# Patient Record
Sex: Male | Born: 1960 | Race: White | Hispanic: No | Marital: Married | Smoking: Never smoker
Health system: Southern US, Community
[De-identification: ages and names within clinical notes are randomized; demographics above are authoritative.]

## PROBLEM LIST (undated history)

## (undated) DIAGNOSIS — K5731 Diverticulosis of large intestine without perforation or abscess with bleeding: Secondary | ICD-10-CM

## (undated) DIAGNOSIS — S52502A Unspecified fracture of the lower end of left radius, initial encounter for closed fracture: Secondary | ICD-10-CM

## (undated) DIAGNOSIS — J189 Pneumonia, unspecified organism: Secondary | ICD-10-CM

## (undated) DIAGNOSIS — E669 Obesity, unspecified: Secondary | ICD-10-CM

## (undated) DIAGNOSIS — K260 Acute duodenal ulcer with hemorrhage: Secondary | ICD-10-CM

## (undated) DIAGNOSIS — M1990 Unspecified osteoarthritis, unspecified site: Secondary | ICD-10-CM

## (undated) DIAGNOSIS — J45909 Unspecified asthma, uncomplicated: Secondary | ICD-10-CM

## (undated) DIAGNOSIS — K922 Gastrointestinal hemorrhage, unspecified: Secondary | ICD-10-CM

## (undated) DIAGNOSIS — G4730 Sleep apnea, unspecified: Secondary | ICD-10-CM

## (undated) DIAGNOSIS — R7303 Prediabetes: Secondary | ICD-10-CM

## (undated) DIAGNOSIS — T7840XA Allergy, unspecified, initial encounter: Secondary | ICD-10-CM

## (undated) DIAGNOSIS — K222 Esophageal obstruction: Secondary | ICD-10-CM

## (undated) DIAGNOSIS — B159 Hepatitis A without hepatic coma: Secondary | ICD-10-CM

## (undated) DIAGNOSIS — N200 Calculus of kidney: Secondary | ICD-10-CM

## (undated) DIAGNOSIS — Z87442 Personal history of urinary calculi: Secondary | ICD-10-CM

## (undated) DIAGNOSIS — D751 Secondary polycythemia: Secondary | ICD-10-CM

## (undated) DIAGNOSIS — D369 Benign neoplasm, unspecified site: Secondary | ICD-10-CM

## (undated) DIAGNOSIS — I509 Heart failure, unspecified: Secondary | ICD-10-CM

## (undated) DIAGNOSIS — I219 Acute myocardial infarction, unspecified: Secondary | ICD-10-CM

## (undated) DIAGNOSIS — E785 Hyperlipidemia, unspecified: Secondary | ICD-10-CM

## (undated) DIAGNOSIS — I10 Essential (primary) hypertension: Secondary | ICD-10-CM

## (undated) DIAGNOSIS — K5792 Diverticulitis of intestine, part unspecified, without perforation or abscess without bleeding: Secondary | ICD-10-CM

## (undated) HISTORY — DX: Hyperlipidemia, unspecified: E78.5

## (undated) HISTORY — PX: UVULECTOMY: SHX2631

## (undated) HISTORY — PX: APPENDECTOMY: SHX54

## (undated) HISTORY — DX: Hepatitis a without hepatic coma: B15.9

## (undated) HISTORY — DX: Acute duodenal ulcer with hemorrhage: K26.0

## (undated) HISTORY — DX: Calculus of kidney: N20.0

## (undated) HISTORY — DX: Diverticulosis of large intestine without perforation or abscess with bleeding: K57.31

## (undated) HISTORY — DX: Obesity, unspecified: E66.9

## (undated) HISTORY — DX: Essential (primary) hypertension: I10

## (undated) HISTORY — PX: FRACTURE SURGERY: SHX138

## (undated) HISTORY — PX: TONSILLECTOMY: SUR1361

## (undated) HISTORY — DX: Allergy, unspecified, initial encounter: T78.40XA

## (undated) HISTORY — DX: Esophageal obstruction: K22.2

## (undated) HISTORY — DX: Sleep apnea, unspecified: G47.30

## (undated) HISTORY — PX: CARPAL TUNNEL RELEASE: SHX101

## (undated) HISTORY — DX: Benign neoplasm, unspecified site: D36.9

## (undated) HISTORY — PX: CORONARY ARTERY BYPASS GRAFT: SHX141

## (undated) HISTORY — PX: HERNIA REPAIR: SHX51

---
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
## 1998-02-25 ENCOUNTER — Inpatient Hospital Stay (HOSPITAL_COMMUNITY): Admission: EM | Admit: 1998-02-25 | Discharge: 1998-02-28 | Payer: Self-pay | Admitting: Emergency Medicine

## 1998-02-25 ENCOUNTER — Encounter: Payer: Self-pay | Admitting: Emergency Medicine

## 1998-05-28 ENCOUNTER — Emergency Department (HOSPITAL_COMMUNITY): Admission: EM | Admit: 1998-05-28 | Discharge: 1998-05-28 | Payer: Self-pay | Admitting: Emergency Medicine

## 1998-06-30 ENCOUNTER — Ambulatory Visit (HOSPITAL_BASED_OUTPATIENT_CLINIC_OR_DEPARTMENT_OTHER): Admission: RE | Admit: 1998-06-30 | Discharge: 1998-06-30 | Payer: Self-pay | Admitting: *Deleted

## 2000-01-30 ENCOUNTER — Emergency Department (HOSPITAL_COMMUNITY): Admission: EM | Admit: 2000-01-30 | Discharge: 2000-01-30 | Payer: Self-pay | Admitting: Emergency Medicine

## 2000-01-30 ENCOUNTER — Encounter: Payer: Self-pay | Admitting: Emergency Medicine

## 2001-04-01 DIAGNOSIS — G4730 Sleep apnea, unspecified: Secondary | ICD-10-CM

## 2001-04-01 HISTORY — DX: Sleep apnea, unspecified: G47.30

## 2001-12-31 ENCOUNTER — Ambulatory Visit (HOSPITAL_BASED_OUTPATIENT_CLINIC_OR_DEPARTMENT_OTHER): Admission: RE | Admit: 2001-12-31 | Discharge: 2001-12-31 | Payer: Self-pay | Admitting: Oral Surgery

## 2002-03-02 ENCOUNTER — Encounter: Payer: Self-pay | Admitting: *Deleted

## 2002-03-04 ENCOUNTER — Ambulatory Visit (HOSPITAL_COMMUNITY): Admission: RE | Admit: 2002-03-04 | Discharge: 2002-03-05 | Payer: Self-pay | Admitting: *Deleted

## 2002-03-04 ENCOUNTER — Encounter (INDEPENDENT_AMBULATORY_CARE_PROVIDER_SITE_OTHER): Payer: Self-pay | Admitting: Specialist

## 2002-03-25 ENCOUNTER — Emergency Department (HOSPITAL_COMMUNITY): Admission: EM | Admit: 2002-03-25 | Discharge: 2002-03-25 | Payer: Self-pay | Admitting: Emergency Medicine

## 2003-04-06 ENCOUNTER — Emergency Department (HOSPITAL_COMMUNITY): Admission: EM | Admit: 2003-04-06 | Discharge: 2003-04-06 | Payer: Self-pay | Admitting: Emergency Medicine

## 2003-08-04 ENCOUNTER — Ambulatory Visit (HOSPITAL_COMMUNITY): Admission: RE | Admit: 2003-08-04 | Discharge: 2003-08-04 | Payer: Self-pay | Admitting: Gastroenterology

## 2003-08-04 ENCOUNTER — Encounter (INDEPENDENT_AMBULATORY_CARE_PROVIDER_SITE_OTHER): Payer: Self-pay | Admitting: *Deleted

## 2008-02-12 ENCOUNTER — Encounter: Payer: Self-pay | Admitting: Family Medicine

## 2008-05-24 ENCOUNTER — Ambulatory Visit: Payer: Self-pay | Admitting: Family Medicine

## 2008-05-24 DIAGNOSIS — Z87442 Personal history of urinary calculi: Secondary | ICD-10-CM | POA: Insufficient documentation

## 2008-05-24 DIAGNOSIS — E1169 Type 2 diabetes mellitus with other specified complication: Secondary | ICD-10-CM | POA: Insufficient documentation

## 2008-05-24 DIAGNOSIS — E785 Hyperlipidemia, unspecified: Secondary | ICD-10-CM

## 2008-05-24 DIAGNOSIS — J45909 Unspecified asthma, uncomplicated: Secondary | ICD-10-CM | POA: Insufficient documentation

## 2008-05-24 DIAGNOSIS — I10 Essential (primary) hypertension: Secondary | ICD-10-CM | POA: Insufficient documentation

## 2008-05-24 DIAGNOSIS — G4730 Sleep apnea, unspecified: Secondary | ICD-10-CM | POA: Insufficient documentation

## 2008-09-19 ENCOUNTER — Ambulatory Visit: Payer: Self-pay | Admitting: Family Medicine

## 2008-10-05 ENCOUNTER — Telehealth: Payer: Self-pay | Admitting: Family Medicine

## 2008-11-10 ENCOUNTER — Telehealth: Payer: Self-pay | Admitting: Family Medicine

## 2008-11-22 ENCOUNTER — Encounter: Payer: Self-pay | Admitting: Family Medicine

## 2009-01-02 ENCOUNTER — Ambulatory Visit: Payer: Self-pay | Admitting: Family Medicine

## 2009-01-02 DIAGNOSIS — J069 Acute upper respiratory infection, unspecified: Secondary | ICD-10-CM

## 2009-03-15 ENCOUNTER — Encounter: Payer: Self-pay | Admitting: Family Medicine

## 2009-04-04 ENCOUNTER — Encounter: Payer: Self-pay | Admitting: Family Medicine

## 2009-07-09 HISTORY — PX: COLONOSCOPY: SHX174

## 2009-08-18 ENCOUNTER — Telehealth: Payer: Self-pay | Admitting: Family Medicine

## 2009-09-15 ENCOUNTER — Telehealth: Payer: Self-pay | Admitting: Family Medicine

## 2009-09-18 ENCOUNTER — Ambulatory Visit: Payer: Self-pay | Admitting: Family Medicine

## 2009-11-16 ENCOUNTER — Ambulatory Visit: Payer: Self-pay | Admitting: Family Medicine

## 2009-11-16 DIAGNOSIS — M545 Low back pain, unspecified: Secondary | ICD-10-CM | POA: Insufficient documentation

## 2010-04-23 ENCOUNTER — Encounter: Payer: Self-pay | Admitting: Sports Medicine

## 2010-05-01 NOTE — Procedures (Signed)
Summary: Colonoscopy Report with Pathology/[facility]  Colonoscopy Report with Pathology/[facility]   Imported By: Maryln Gottron 09/28/2009 15:01:03  _____________________________________________________________________  External Attachment:    Type:   Image     Comment:   External Document

## 2010-05-01 NOTE — Consult Note (Signed)
Summary: [facility]  [facility]   Imported By: Maryln Gottron 04/20/2009 13:43:48  _____________________________________________________________________  External Attachment:    Type:   Image     Comment:   External Document

## 2010-05-01 NOTE — Progress Notes (Signed)
Summary: Provigil?  Phone Note Call from Patient   Caller: Patient Call For: Nelwyn Salisbury MD Summary of Call: Pt is traveling to [place], and is requesting a prescription for Provigil to help with jet lag.  Only wants #4.  [facility] [phone] Initial call taken by: Lynann Beaver CMA,  Aug 18, 2009 8:25 AM  Follow-up for Phone Call        call in Provigil 200 mg once daily as needed , #4 with no rf Follow-up by: Nelwyn Salisbury MD,  Aug 18, 2009 8:45 AM    New/Updated Medications: PROVIGIL 200 MG TABS (MODAFINIL) on by mouth daily prn Prescriptions: PROVIGIL 200 MG TABS (MODAFINIL) on by mouth daily prn  #4 x 0   Entered by:   Lynann Beaver CMA   Authorized by:   Nelwyn Salisbury MD   Signed by:   Lynann Beaver CMA on 08/18/2009   Method used:   Telephoned to ...       [facility]  [place] [address] (retail)       [address]       [place], [place]  [address]       Ph: [phone] or [phone]       Fax: [phone]   RxID:   [id number]  pt notified.

## 2010-05-01 NOTE — Progress Notes (Signed)
Summary: please return his call has ? about ekg from ins  Phone Note Call from Patient Call back at [phone]   Caller: Patient--live call Reason for Call: Talk to Nurse Summary of Call: wants to discuss with Dr Clent Ridges about an EKG that he received from ins company. Please return his call. Initial call taken by: Warnell Forester,  September 15, 2009 8:34 AM  Follow-up for Phone Call        Appt scheduled with Dr. Clent Ridges for repeat EKG. Follow-up by: Lynann Beaver CMA,  September 15, 2009 9:32 AM

## 2010-05-01 NOTE — Assessment & Plan Note (Signed)
Summary: EKG repeat for life insurance/dm   Vital Signs:  Patient profile:   50 year old male Weight:      293 pounds BMI:     39.88 BP sitting:   154 / 104  (left arm) Cuff size:   large  Vitals Entered By: Raechel Ache, RN (September 18, 2009 9:43 AM) CC: Requesting repeat EKG for life insurance policy.   History of Present Illness: Here for a repeat EKG. His BP has been quite steady at home, usually in the range of 110-130/70-80. He has felt fine. he recently requested an increase in his life insurance, and a company representative came to his home. He did an EKG, and Oscar [name] was told it was "abnormal" with no further details. They told him to check with me. Oscar [name] actually had a copy of this in his cell phone, which he showed me today. It looked quite normal.   Allergies: No Known Drug Allergies  Past History:  Past Medical History: Reviewed history from 05/24/2008 and no changes required. Chickenpox Hyperlipidemia Hypertension Nephrolithiasis, hx of sleep apnea, diagnosed with a sleep test 2003, uses a CPAP machine  obesity Asthma had Hepatitis A as a child  Past Surgical History: Appendectomy Tonsillectomy UPPP for sleep apnea repairs times four for ventral hernias at his appendectomy site colonoscopy 2011 per Dr. Wandalee Ferdinand, benign polyp, repeat 5 yrs  Review of Systems  The patient denies anorexia, fever, weight loss, weight gain, vision loss, decreased hearing, hoarseness, chest pain, syncope, dyspnea on exertion, peripheral edema, prolonged cough, headaches, hemoptysis, abdominal pain, melena, hematochezia, severe indigestion/heartburn, hematuria, incontinence, genital sores, muscle weakness, suspicious skin lesions, transient blindness, difficulty walking, depression, unusual weight change, abnormal bleeding, enlarged lymph nodes, angioedema, breast masses, and testicular masses.    Physical Exam  General:  overweight-appearing.   Lungs:  Normal respiratory effort,  chest expands symmetrically. Lungs are clear to auscultation, no crackles or wheezes. Heart:  Normal rate and regular rhythm. S1 and S2 normal without gallop, murmur, click, rub or other extra sounds. EKG normal.   Impression & Recommendations:  Problem # 1:  HYPERTENSION (ICD-401.9)  His updated medication list for this problem includes:    Hydrochlorothiazide 12.5 Mg Caps (Hydrochlorothiazide) ..... Once daily    Norvasc 10 Mg Tabs (Amlodipine besylate) ..... Once daily  Orders: EKG w/ Interpretation (93000)  Complete Medication List: 1)  Aspirin 81 Mg Tbec (Aspirin) .... One by mouth every day 2)  Co-enzyme Q-10 10 Mg Caps (Coenzyme q10) .... Once daily 3)  Fish Oil 1000 Mg Caps (Omega-3 fatty acids) .... Once daily 4)  Biotin 1000 Mcg Tabs (Biotin) .... Once daily 5)  Flax Oil (Flaxseed (linseed)) .... Once daily 6)  Hydrochlorothiazide 12.5 Mg Caps (Hydrochlorothiazide) .... Once daily 7)  Norvasc 10 Mg Tabs (Amlodipine besylate) .... Once daily 8)  Provigil 200 Mg Tabs (Modafinil) .... On by mouth daily prn 9)  Fexofenadine Hcl 180 Mg Tabs (Fexofenadine hcl) .Marland Kitchen.. 1 two times a day 10)  Nasonex 50 Mcg/act Susp (Mometasone furoate) .... As needed  Patient Instructions: 1)  His EKG looks fine today, and I wrote a note to that effect for him to give his insurance company.  2)  Please schedule a follow-up appointment as needed .

## 2010-05-01 NOTE — Letter (Signed)
Summary: EKG Results  EKG Results   Imported By: Maryln Gottron 09/19/2009 13:59:26  _____________________________________________________________________  External Attachment:    Type:   Image     Comment:   External Document
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
## 2010-05-01 NOTE — Letter (Signed)
Summary: [facility]  [facility]   Imported By: Maryln Gottron 05/25/2009 09:57:38  _____________________________________________________________________  External Attachment:    Type:   Image     Comment:   External Document

## 2010-08-17 NOTE — Op Note (Signed)
NAME:  Oscar [name], Oscar [name]                        ACCOUNT NO.:  [id number]   MEDICAL RECORD NO.:  [id number]                   PATIENT TYPE:  OIB   LOCATION:  2550                                 FACILITY:  [facility]   PHYSICIAN:  Veverly Fells. Arletha Grippe, M.D.             DATE OF BIRTH:  [birth year]   DATE OF PROCEDURE:  03/04/2002  DATE OF DISCHARGE:                                 OPERATIVE REPORT   PREOPERATIVE DIAGNOSES:  1. Severe obstructive sleep apnea.  2. Nasal airway obstruction.  3. Septal deviation.  4. Inferior turbinate hypertrophy.  5. Elongated soft palate and uvula.  6. Tongue base hypertrophy.   POSTOPERATIVE DIAGNOSES:  1. Severe obstructive sleep apnea.  2. Nasal airway obstruction.  3. Septal deviation.  4. Inferior turbinate hypertrophy.  5. Elongated soft palate and uvula.  6. Tongue base hypertrophy.   PROCEDURES:  1. Uvulopalatopharyngoplasty.  2. Tongue base somnoplasty.  3. Nasal septal reconstruction.  4. Intramural cauterization of both inferior turbinates.   SURGEON:  Veverly Fells. Arletha Grippe, M.D.   ANESTHESIA:  General endotracheal.   FLUIDS REPLACED:  Approximately 1 L crystalloid.   ESTIMATED BLOOD LOSS:  Less than 50 cc.   URINE OUTPUT:  Not measured.   DRAINS/PACKS:  None.   SPECIMENS:  Septal cartilage and bone and portion of uvula and soft palate,  for gross pathology only.   INDICATION FOR PROCEDURE:  This is a 50 year old white male with history of  hypertension, history of loud snoring at night with some daytime somnolence  and morning fatigue.  He has had prior palatal somnoplasty for elongated  soft palate and uvula.  Inpatient polysomnogram obtained at [facility] on 12/31/01 did show severe obstructive sleep apnea with an RDI of  60 events per hour and desaturations down to 78%.  Physical examination did  show a significant S-shaped septal deformity, inferior turbinate congestion  and hypertrophy, elongated soft palate and  uvula, and a significant amount  of tongue base hypertrophy.  Based on his history and physical examination,  I have recommended proceeding with the above-noted surgical procedure.  I  discussed extensively with him the risks and benefits of surgery including  risks of general anesthesia, infection, bleeding, possibility of airway  compromise with tongue base swelling after tongue base somnoplasty requiring  emergent intervention via surgery with either oral endotracheal intubation  or tracheotomy, and the need for septal splinting and long recovery period  expected after this type of surgery.  I have entertained any questions,  answered them appropriately, and informed consent has been obtained and the  patient presents now for the above-noted procedure.   OPERATIVE FINDINGS:  Septal deviation to the left with a septal spur.  Inferior turbinate congestion and hypertrophy.  Elongated soft palate and  uvula and moderate tongue base hypertrophy.   DESCRIPTION OF PROCEDURE:  The patient was brought in the operating room and  placed in  the supine position.  General endotracheal anesthesia administered  via the anesthesiologist without complication.  The patient was administered  1 g of Ancef IV x1 and 10 mg of Decadron IV x1.  The head of the table was  turned 90 degrees.  The patient's face was draped in the standard fashion.  A Crowe-Davis mouth retractor was inserted into the oral cavity.  This was  used to retract the mouth open.  The area of some scar tissue along both  tonsillar fossae was identified, and this area was resected using the  Harmonic scalpel in such a way as cut down on redundant posterior pharyngeal  mucosa.  The uvular portion of the soft palate was then transected in a  horizontal fashion using the Harmonic scalpel in such a way to leave enough  soft palate behind to prevent velopharyngeal insufficiency.  This was done  with a posterior-based trapdoor flap.  Bleeding  from the area was controlled  with a combination of Harmonic scalpel and suction cautery.  The tissue was  then resected using the Harmonic scalpel and sent to surgical pathology for  gross pathology.  The area was reinspected, and there was no evidence of any  active bleeding.  The anterior and posterior tonsillar pillars bilaterally  and the cut edges of the palate were reapproximated with multiple  interrupted 3-0 Vicryl suture.  Approximately 3 cc of 0.5% Marcaine solution  were infiltrated into the palate and anterior tonsillar pillars bilaterally.  The Crowe-Davis mouth retractor was released and brought through the oral  cavity without incident.  Next the head of the table was elevated 30  degrees.  A Maltz oral retractor was used to open the jaws widely.  A  retraction stitch was placed with a 2-0 silk stitch in the anterior tongue  to retract his tongue out anteriorly.  Methylene blue was then used to Terius  out three midline areas just at the level of the circumvallate papillae and  posterior to that.  Approximately 0.5 cm separated these marked-out three  areas.  Each area was injected with about 3-4 cc of a saline solution and  then three sequential tongue base somnoplasties were performed with the dual  probe.  One thousand two hundred joules were administered in each series of  lesions, that is, 600 joules were administered in each of the two probes, so  a total of six lesions were created in the midline from anterior to  posterior, separated about 0.5 cm, and were delivered with the dual probe  without difficulties.  After the tongue base somnoplasty was performed, the  retraction stitch was removed, the tongue was placed back in the oral  cavity, and the Maltz retractor was removed.  Cotton pledgets soaked in a 4%  cocaine solution were placed in each nasal cavity and were left in place for approximately five to 10 minutes and then removed.  Both sides of the septum  were  infiltrated with 1% lidocaine solution with 1:1000 epinephrine.  After  waiting approximately 10 minutes, a standard Killian incision was made on  the left side of the septum.  Mucoperichondrial and mucoperiosteal flap was  elevated on the left side using both blunt and sharp dissection.  An  intercartilaginous incision was made approximately 1 cm posterior from the  caudal end of the septum.  Mucoperichondrial and mucoperiosteal flap was  elevated on the right side using both blunt and sharp dissection.  Cartilaginous deviation was removed with the domes with  the  Silver knife.  Posterior bony deflection was removed with the Jansen-Middleton forceps, and  the septal spur which was pushing off the left nasal chamber was removed  using an open Jansen-Middleton forceps.  A piece of trimmed morcellized  cartilage was placed in between the septal flaps.  The septal incision was  closed with interrupted chromic suture, and the septum was reinforced with a  running transseptal plain gut mattress stitch.  Both inferior turbinates  were injected with a total of 6 cc of 1% lidocaine solution with 1:1000  epinephrine.  Both inferior turbinates were then intramurally cauterized  using the Elmed intramural cauterization unit set on a 12 watt setting.  Three passes of both inferior turbinates were performed without difficulty,  and both inferior turbinates were then outfractured using gentle lateral  pressure with a large nasal speculum.  Doyle splints soaked in a Bactroban  ointment solution were placed on either side of the septum and held in place  with transseptal Prolene suture.  An orogastric tube was placed.  This was  used to decompress the stomach contents.  It was then removed without  incident, and there was no evidence of any tongue base swelling after the  procedure.  The patient tolerated the procedure well without complications,  was extubated in the operating room and transferred to the  recovery room in  stable condition.  The sponge, instrument, and needle counts were correct at  the end of the procedure.  Total duration of procedure was approximately two  hours.  The patient will be admitted in a stepdown bed overnight for  monitoring purposes.  If he has recovered well, he will be sent home on  03/05/02.  He will be sent home on Augmentin elixir 400 mg p.o. t.i.d. for 10  days, Lortab elixir 250 cc with two refills 10-15 cc p.o. q.4h. p.r.n. pain,  and Vioxx 50 mg tablets one tablet p.o. q.d. for 10 days.  Both he and his  son were given oral and written instructions.  They are to call with any  problems with bleeding, fever, vomiting, pain, reaction to medications, or  any other questions.  He is to have light activity with no heavy lifting or  nose blowing for two weeks after surgery and a post-tonsillectomy diet for  two weeks after surgery.  He will follow up for splint removal in the office on 03/11/02 at 1:50 p.m.                                               Veverly Fells. Arletha Grippe, M.D.    MDR/MEDQ  D:  03/04/2002  T:  03/04/2002  Job:  [id number]   cc:   Dora Sims, M.D.  [address]  [place]  [place] [address]  Fax: [phone]

## 2010-08-17 NOTE — Op Note (Signed)
NAME:  Oscar, [name] NO.:  [id number]   MEDICAL RECORD NO.:  [id number]                   PATIENT TYPE:  AMB   LOCATION:  ENDO                                 FACILITY:  [facility]   PHYSICIAN:  Graylin Shiver, M.D.                DATE OF BIRTH:  Mar 06, 1961   DATE OF PROCEDURE:  08/04/2003  DATE OF DISCHARGE:                                 OPERATIVE REPORT   PROCEDURE:  Colonoscopy with polypectomy.   INDICATIONS FOR PROCEDURE:  Rectal bleeding.   CONSENT:  Informed consent was obtained after explanation of the risks of  bleeding, infection, and perforation.   PREMEDICATION:  Fentanyl 100 mcg IV, Versed 10 mg IV.   PROCEDURE IN DETAIL:  With the patient in the left lateral decubitus  position, a rectal exam was performed and no masses were felt.  The Olympus  colonoscope was inserted into the rectum and advanced around the colon to  the cecum.  Cecal landmarks were identified.  The cecum and ascending colon  were normal  The transverse colon was normal.  The descending colon and  sigmoid revealed diverticulosis.  In the sigmoid at 22 cm, there was an 8 mm  pedunculated polyp, this was snared and removed by snare cautery technique.  The polyp was retrieved and the cautery site looked good.  The rectum looked  normal.  There were some tiny internal hemorrhoids.  He tolerated the  procedure well without complications.   IMPRESSION:  1. Diverticulosis of the left colon.  2. Sigmoid polyp.  3. Tiny internal hemorrhoids.   PLAN:  The pathology will be checked on follow up.                                               Graylin Shiver, M.D.    Germain Osgood  D:  08/04/2003  T:  08/04/2003  Job:  [id number]   cc:   Stacie Acres. White, M.D.  [address]  [place]  [place] [address]  Fax: [phone]

## 2010-11-22 ENCOUNTER — Other Ambulatory Visit: Payer: Self-pay | Admitting: Family Medicine

## 2011-05-20 ENCOUNTER — Ambulatory Visit (INDEPENDENT_AMBULATORY_CARE_PROVIDER_SITE_OTHER): Payer: Self-pay | Admitting: Family Medicine

## 2011-05-20 ENCOUNTER — Encounter: Payer: Self-pay | Admitting: Family Medicine

## 2011-05-20 VITALS — BP 140/90 | HR 108 | Temp 98.3°F | Wt 271.0 lb

## 2011-05-20 DIAGNOSIS — J40 Bronchitis, not specified as acute or chronic: Secondary | ICD-10-CM

## 2011-05-20 MED ORDER — AMOXICILLIN-POT CLAVULANATE 875-125 MG PO TABS: 1.0000 | ORAL_TABLET | Freq: Two times a day (BID) | ORAL | Status: AC

## 2011-05-20 NOTE — Progress Notes (Signed)
  Subjective:    Patient ID: Oscar [name], male    DOB: July 10, 1960, 51 y.o.   MRN: [id number]  HPI Here for 5 days of chest congestion, SOB, and coughing up green sputum. On Mucinex.    Review of Systems  Constitutional: Negative.   HENT: Negative.   Eyes: Negative.   Respiratory: Positive for cough, shortness of breath and wheezing.        Objective:   Physical Exam  Constitutional: He appears well-developed and well-nourished.  HENT:  Right Ear: External ear normal.  Left Ear: External ear normal.  Nose: Nose normal.  Mouth/Throat: Oropharynx is clear and moist. No oropharyngeal exudate.  Eyes: Conjunctivae are normal.  Pulmonary/Chest: Effort normal. No respiratory distress. He has no wheezes. He has no rales.       Deep rhonchi   Lymphadenopathy:    He has no cervical adenopathy.          Assessment & Plan:  Recheck prn

## 2011-06-17 ENCOUNTER — Encounter: Payer: Self-pay | Admitting: Family Medicine

## 2011-06-17 ENCOUNTER — Ambulatory Visit (INDEPENDENT_AMBULATORY_CARE_PROVIDER_SITE_OTHER): Payer: BC Managed Care – PPO | Admitting: Family Medicine

## 2011-06-17 VITALS — BP 142/90 | HR 99 | Temp 98.1°F | Wt 272.0 lb

## 2011-06-17 DIAGNOSIS — R1032 Left lower quadrant pain: Secondary | ICD-10-CM

## 2011-06-17 NOTE — Progress Notes (Signed)
  Subjective:    Patient ID: Oscar [name], male    DOB: 1960-08-08, 51 y.o.   MRN: [id number]  HPI Here for 2 days of constant sharp pains in the LLQ of the abdomen. These are worse when he gets up from a sitting or lying position and walks around, then it feels better. He has no pain when sitting still, but he has mild pain when lying flat on his back. He has been working out hard at [employer] with abdominal and core exercises, and the night before this pain started he had the hardest workout he has ever had. He has been taking a lot of Pepto-Bismol this past week thinking he was "impacted" but he has had loose stools for the past 4 days. No fever or nausea. Appetite is normal. No urinary symptoms.   Review of Systems  Constitutional: Negative.   Respiratory: Negative.   Cardiovascular: Negative.   Gastrointestinal: Positive for abdominal pain and diarrhea. Negative for nausea, vomiting, constipation, blood in stool, abdominal distention and rectal pain.  Genitourinary: Negative.        Objective:   Physical Exam  Constitutional: He appears well-developed and well-nourished.  Abdominal: Soft. Bowel sounds are normal. He exhibits no distension. There is no rebound and no guarding.       He has a long term ventral hernia in the midline which is not tender at all. He is slightly tender in there LLQ just above the pelvic brim. No inguinal hernias.   Genitourinary: Rectum normal and prostate normal.          Assessment & Plan:  This is probably a muscular injury, most likely a tear in the lower abdominal wall from his workouts. The loose stools probably result from using too much Pepto-Bismol. We agreed to give this some more time, and he will call us for an update tomorrow. He will follow up if the pain gets any worse or he gets a fever, etc.

## 2011-06-18 ENCOUNTER — Encounter (HOSPITAL_COMMUNITY): Payer: Self-pay

## 2011-06-18 ENCOUNTER — Telehealth: Payer: Self-pay | Admitting: Family Medicine

## 2011-06-18 ENCOUNTER — Emergency Department (HOSPITAL_COMMUNITY)
Admission: EM | Admit: 2011-06-18 | Discharge: 2011-06-18 | Disposition: A | Discharge status: Home / Self Care | Payer: BC Managed Care – PPO | Attending: Emergency Medicine | Admitting: Emergency Medicine

## 2011-06-18 ENCOUNTER — Ambulatory Visit (INDEPENDENT_AMBULATORY_CARE_PROVIDER_SITE_OTHER)
Admission: RE | Admit: 2011-06-18 | Discharge: 2011-06-18 | Disposition: A | Discharge status: Home / Self Care | Payer: BC Managed Care – PPO | Source: Ambulatory Visit | Attending: Family Medicine | Admitting: Family Medicine

## 2011-06-18 DIAGNOSIS — Z0389 Encounter for observation for other suspected diseases and conditions ruled out: Secondary | ICD-10-CM | POA: Insufficient documentation

## 2011-06-18 DIAGNOSIS — R1032 Left lower quadrant pain: Secondary | ICD-10-CM

## 2011-06-18 LAB — BASIC METABOLIC PANEL
CO2: 24 mEq/L (ref 19–32)
Calcium: 8.7 mg/dL (ref 8.4–10.5)
GFR: 79.15 mL/min (ref >60.00)
Potassium: 3.6 mEq/L (ref 3.5–5.1)
Sodium: 141 mEq/L (ref 135–145)

## 2011-06-18 LAB — POCT URINALYSIS DIPSTICK
Bilirubin, UA: NEGATIVE
Glucose, UA: NEGATIVE
Spec Grav, UA: 1.025

## 2011-06-18 LAB — HEPATIC FUNCTION PANEL
Alkaline Phosphatase: 53 U/L (ref 39–117)
Bilirubin, Direct: 0 mg/dL (ref 0.0–0.3)
Total Protein: 7.6 g/dL (ref 6.0–8.3)

## 2011-06-18 LAB — CBC WITH DIFFERENTIAL/PLATELET
Basophils Relative: 0.3 % (ref 0.0–3.0)
Eosinophils Absolute: 0.1 10*3/uL (ref 0.0–0.7)
Hemoglobin: 15.7 g/dL (ref 13.0–17.0)
MCHC: 33.5 g/dL (ref 30.0–36.0)
MCV: 87.5 fl (ref 78.0–100.0)
Monocytes Absolute: 1 10*3/uL (ref 0.1–1.0)
Neutro Abs: 6.7 10*3/uL (ref 1.4–7.7)
RBC: 5.36 Mil/uL (ref 4.22–5.81)

## 2011-06-18 MED ORDER — IOHEXOL 300 MG/ML  SOLN: 100.0000 mL | Freq: Once | INTRAMUSCULAR | Status: DC | PRN

## 2011-06-18 NOTE — Telephone Encounter (Signed)
Patient called stating that he will be leaving the country on Thursday and would like to be called on his mobile phone with results on his CT scan. Please assist.

## 2011-06-18 NOTE — Telephone Encounter (Signed)
Dr. Clent Ridges did speak with pt.

## 2011-06-18 NOTE — ED Notes (Signed)
Name called no answer 

## 2011-06-18 NOTE — Progress Notes (Signed)
Addended by: Gershon Crane A on: 06/18/2011 08:24 AM   Modules accepted: Orders

## 2011-06-18 NOTE — ED Notes (Signed)
While triaging pt.,  Pt. Became upset and annoyed, that he was not going directly back for admission.   Spoke with pt. About the conversation from his Doctors office and encouraged pt. To call his PCP at [facility].  After pt. Spoke with his doctor's office, he is to be evaluated by the EDP.   Explained to pt. That we would get him to a room as soon as we could and to please let us know if any thing chan ges .  Presently, pt denies any pain or discomfort, vitals stable.

## 2011-06-18 NOTE — ED Notes (Signed)
Pt. Having abdominal pain in his rt. Lower abdominal area  And was diagnosed today by CT scan with diverticultis.  Pt. Was directed to come to [place] for Eval.

## 2011-06-18 NOTE — ED Notes (Signed)
[facility] called to report that pt. Would be coming to ed and to Have EDP eval for new diagnosis of diverticultis.

## 2011-06-20 NOTE — Progress Notes (Signed)
Quick Note:  Left voice message and requested that pt call back and give a update on his condition and how he is feeling. ______

## 2011-10-26 ENCOUNTER — Other Ambulatory Visit: Payer: Self-pay | Admitting: Family Medicine

## 2012-02-24 ENCOUNTER — Ambulatory Visit (INDEPENDENT_AMBULATORY_CARE_PROVIDER_SITE_OTHER): Payer: BC Managed Care – PPO | Admitting: Surgery

## 2012-02-24 ENCOUNTER — Encounter (INDEPENDENT_AMBULATORY_CARE_PROVIDER_SITE_OTHER): Payer: Self-pay | Admitting: Surgery

## 2012-02-24 VITALS — BP 148/90 | HR 90 | Temp 98.0°F | Resp 16 | Ht 74.5 in | Wt 281.6 lb

## 2012-02-24 DIAGNOSIS — M6208 Separation of muscle (nontraumatic), other site: Secondary | ICD-10-CM | POA: Insufficient documentation

## 2012-02-24 DIAGNOSIS — K432 Incisional hernia without obstruction or gangrene: Secondary | ICD-10-CM | POA: Insufficient documentation

## 2012-02-24 DIAGNOSIS — M6200 Separation of muscle (nontraumatic), unspecified site: Secondary | ICD-10-CM

## 2012-02-24 NOTE — Patient Instructions (Signed)
  [facility]  LAPAROSCOPIC SURGERY - POST-OP INSTRUCTIONS  Always review your discharge instruction sheet given to you by the facility where your surgery was performed.  A prescription for pain medication may be given to you upon discharge.  Take your pain medication as prescribed.  If narcotic pain medicine is not needed, then you may take acetaminophen (Tylenol) or ibuprofen (Advil) as needed.  Take your usually prescribed medications unless otherwise directed.  If you need a refill on your pain medication, please contact your pharmacy.  They will contact our office to request authorization. Prescriptions will not be filled after 5 P.M. or on weekends.  You should follow a light diet the first few days after arrival home, such as soup and crackers or toast.  Be sure to include plenty of fluids daily.  Most patients will experience some swelling and bruising in the area of the incisions.  Ice packs will help.  Swelling and bruising can take several days to resolve.   It is common to experience some constipation if taking pain medication after surgery.  Increasing fluid intake and taking a stool softener (such as Colace) will usually help or prevent this problem from occurring.  A mild laxative (Milk of Magnesia or Miralax) should be taken according to package instructions if there are no bowel movements after 48 hours.  Unless discharge instructions indicate otherwise, you may remove your bandages 24-48 hours after surgery, and you may shower at that time.  You may have steri-strips (small skin tapes) in place directly over the incision.  These strips should be left on the skin for 7-10 days.  If your surgeon used skin glue on the incision, you may shower in 24 hours.  The glue will flake off over the next 2-3 weeks.  Any sutures or staples will be removed at the office during your follow-up visit.  ACTIVITIES:  You may resume regular (light) daily activities beginning the  next day-such as daily self-care, walking, climbing stairs-gradually increasing activities as tolerated.  You may have sexual intercourse when it is comfortable.  Refrain from any heavy lifting or straining until approved by your doctor.  You may drive when you are no longer taking prescription pain medication, you can comfortably wear a seatbelt, and you can safely maneuver your car and apply brakes.  You should see your doctor in the office for a follow-up appointment approximately 2-3 weeks after your surgery.  Make sure that you call for this appointment within a day or two after you arrive home to insure a convenient appointment time.  WHEN TO CALL YOUR DOCTOR: 1. Fever over 101.0 2. Inability to urinate 3. Continued bleeding from incision 4. Increased pain, redness, or drainage from the incision 5. Increasing abdominal pain  The clinic staff is available to answer your questions during regular business hours.  Please don't hesitate to call and ask to speak to one of the nurses for clinical concerns.  If you have a medical emergency, go to the nearest emergency room or call 911.  A surgeon from [facility] is always on call for the hospital.  Stevon Gough M. Jun Osment, MD, FACS [facility] Office: [phone] Toll Free:  [phone] [phone]  Web site: [email or web address] 

## 2012-02-24 NOTE — Progress Notes (Signed)
General Surgery [facility]  Chief Complaint  Patient presents with  . Umbilical Hernia    Evaluate for possible recurrent hernia - referral from Dr. Malva Limes, primary care Dr. Gershon Crane    HISTORY: The patient is a 51 year old white male referred for evaluation of a recurrent ventral incisional hernia. Patient apparently had undergone laparoscopic appendectomy approximately 10 years ago. He subsequently developed a hernia at the level of the umbilicus. He has had for additional procedures to repair the hernia, all of which have failed. He is minimally symptomatic. He occasionally has to discomfort especially with pressure against the abdominal wall. He actively exercises and lifts weights and this results in some discomfort. A CT scan of the abdomen and pelvis performed March 2013 shows a recurrent hernia at the level of the umbilicus containing adipose tissue. There is no sign of intestinal obstruction. Patient has also noted a rectus diastasis. He presents today for evaluation and further surgical recommendations.  Other than appendectomy and hernia repairs at the level of the umbilicus, the patient has had no other abdominal surgical procedures.  Past Medical History  Diagnosis Date  . Hyperlipidemia   . Asthma   . Chickenpox   . Nephrolithiasis   . Sleep apnea 2003    CPAP machine  . Obesity   . Hepatitis A     as a child  . Hypertension     he also sees Dr. Shellee Milo in [place], [place]      Current Outpatient Prescriptions  Medication Sig Dispense Refill  . amLODipine (NORVASC) 10 MG tablet TAKE 1 TABLET BY MOUTH EVERY DAY  30 tablet  10  . Ascorbic Acid (VITAMIN C) 1000 MG tablet Take 1,000 mg by mouth daily.      Marland Kitchen aspirin EC 81 MG tablet Take 81 mg by mouth daily.      Marland Kitchen b complex vitamins tablet Take 1 tablet by mouth daily.      . Cholecalciferol (VITAMIN D3) 5000 UNITS TABS Take 1 tablet by mouth at bedtime.       . Coenzyme Q10 (CO Q 10 PO) Take  1 capsule by mouth daily.       Marland Kitchen KRILL OIL PO Take 1 capsule by mouth daily.       . metFORMIN (GLUCOPHAGE) 500 MG tablet Take 1,000 mg by mouth 2 (two) times daily with a meal.      . niacin 500 MG tablet Take 500 mg by mouth at bedtime.       . Probiotic Product (PROBIOTIC FORMULA PO) Take 1 tablet by mouth daily.       . psyllium (REGULOID) 0.52 G capsule Take 0.52 g by mouth daily.      . [DISCONTINUED] amLODipine (NORVASC) 10 MG tablet Take 10 mg by mouth daily.         No Known Allergies   Family History  Problem Relation Age of Onset  . Alcohol abuse    . Diabetes    . Hypertension    . Cancer      lung  . Stroke    . Obesity       History   Social History  . Marital Status: Married    Spouse Name: N/A    Number of Children: N/A  . Years of Education: N/A   Social History Main Topics  . Smoking status: Never Smoker   . Smokeless tobacco: Never Used  . Alcohol Use: Yes  Comment: couple times a month  . Drug Use: No  . Sexually Active: None   Other Topics Concern  . None   Social History Narrative  . None     REVIEW OF SYSTEMS - PERTINENT POSITIVES ONLY: Denies signs or symptoms of obstruction. Occasional discomfort at level of umbilicus.  EXAM: Filed Vitals:   02/24/12 1453  BP: 148/90  Pulse: 90  Temp: 98 F (36.7 C)  Resp: 16    HEENT: normocephalic; pupils equal and reactive; sclerae clear; dentition good; mucous membranes moist NECK:  symmetric on extension; no palpable anterior or posterior cervical lymphadenopathy; no supraclavicular masses; no tenderness CHEST: clear to auscultation bilaterally without rales, rhonchi, or wheezes CARDIAC: regular rate and rhythm without significant murmur; peripheral pulses are full ABDOMEN: soft without distension; bowel sounds present; no mass; no hepatosplenomegaly; moderate rectus diastasis with sit-up maneuver; moderate hernia at level of the umbilicus with fascial defect measuring approximately 3  cm in diameter, reducible, nontender EXT:  non-tender without edema; no deformity NEURO: no gross focal deficits; no sign of tremor   LABORATORY RESULTS: See [facility] (CHL-Epic) for most recent results   RADIOLOGY RESULTS: See [facility] (CHL-Epic) for most recent results   IMPRESSION: #1 recurrent ventral incisional hernia at level of umbilicus, reducible, mildly symptomatic #2 rectus diastasis, moderate #3 hypertension #4 hyperglycemia  PLAN: I discussed at length with the patient the options for surgical management. If the patient desires to restore the contour of his abdominal wall and repair the rectus diastases at the same time as repairing his umbilical/incisional hernia, then he needs to consider plastic surgery consultation for abdominoplasty. If the patient's goal is repair of his incisional hernia at the level of the umbilicus, then I believe a laparoscopic approach with a wide overlay of mesh is the appropriate course.  Patient and I discussed this at length. I recommended laparoscopic repair of his incisional hernia with mesh. We have discussed the hospital stay to be anticipated. We have discussed potential complications including infection, recurrence, and failure to achieve desired cosmetic results. He understands and wishes to proceed with surgery in the near future. We will make arrangements for his procedure at a time convenient for him. We have discussed the restrictions on his physical activities and on his exercise program following the procedure. He understands and agrees to comply.  The risks and benefits of the procedure have been discussed at length with the patient.  The patient understands the proposed procedure, potential alternative treatments, and the course of recovery to be expected.  All of the patient's questions have been answered at this time.  The patient wishes to proceed with surgery.  Velora Heckler, MD, FACS General & Endocrine  Surgery [facility]   Visit Diagnoses: 1. Incisional hernia, recurrent   2. Rectus diastasis     Primary Care Physician: Nelwyn Salisbury, MD

## 2012-03-27 ENCOUNTER — Encounter (HOSPITAL_COMMUNITY): Payer: Self-pay | Admitting: Pharmacy Technician

## 2012-04-03 NOTE — Progress Notes (Signed)
Office visit Dr. Gerrit Friends 02/24/12 on chart

## 2012-04-06 ENCOUNTER — Other Ambulatory Visit (HOSPITAL_COMMUNITY): Payer: Self-pay | Admitting: Surgery

## 2012-04-06 ENCOUNTER — Encounter (HOSPITAL_COMMUNITY): Payer: Self-pay

## 2012-04-06 ENCOUNTER — Encounter (HOSPITAL_COMMUNITY)
Admission: RE | Admit: 2012-04-06 | Discharge: 2012-04-06 | Disposition: A | Discharge status: Home / Self Care | Payer: BC Managed Care – PPO | Source: Ambulatory Visit | Attending: Surgery | Admitting: Surgery

## 2012-04-06 ENCOUNTER — Ambulatory Visit (HOSPITAL_COMMUNITY)
Admission: RE | Admit: 2012-04-06 | Discharge: 2012-04-06 | Disposition: A | Discharge status: Home / Self Care | Payer: BC Managed Care – PPO | Source: Ambulatory Visit | Attending: Surgery | Admitting: Surgery

## 2012-04-06 DIAGNOSIS — Z01812 Encounter for preprocedural laboratory examination: Secondary | ICD-10-CM | POA: Insufficient documentation

## 2012-04-06 DIAGNOSIS — Z01818 Encounter for other preprocedural examination: Secondary | ICD-10-CM | POA: Insufficient documentation

## 2012-04-06 DIAGNOSIS — I10 Essential (primary) hypertension: Secondary | ICD-10-CM | POA: Insufficient documentation

## 2012-04-06 LAB — SURGICAL PCR SCREEN
MRSA, PCR: NEGATIVE
Staphylococcus aureus: NEGATIVE

## 2012-04-06 LAB — CBC
Platelets: 271 10*3/uL (ref 150–400)
RBC: 5.83 MIL/uL — ABNORMAL HIGH (ref 4.22–5.81)
WBC: 8.1 10*3/uL (ref 4.0–10.5)

## 2012-04-06 LAB — BASIC METABOLIC PANEL
Calcium: 9.6 mg/dL (ref 8.4–10.5)
GFR calc Af Amer: >90 mL/min (ref >90)
GFR calc non Af Amer: >90 mL/min (ref >90)
Sodium: 139 mEq/L (ref 135–145)

## 2012-04-06 NOTE — Progress Notes (Signed)
Pt on metformin but not diabetic, in wellness study.

## 2012-04-06 NOTE — Patient Instructions (Addendum)
20 Oscar [name]  04/06/2012   Your procedure is scheduled on: 04-09-2012  Report to [facility] a 0830 AM.  Call this number if you have problems the morning of surgery [phone]   Remember:bring cpap mask and tubing   Do not eat food or drink liquids :After Midnight.    Take these medicines the morning of surgery with A SIP OF WATER: amlodipine, lipitor, zyrtec  Do not wear jewelry, make-up or nail polish.  Do not wear lotions, powders, or perfumes. You may wear deodorant.  Do not shave 48 hours prior to surgery. Men may shave face and neck.  Do not bring valuables to the hospital.  Contacts, dentures or bridgework may not be worn into surgery.  Leave suitcase in the car. After surgery it may be brought to your room.  For patients admitted to the hospital, checkout time is 11:00 AM the day of discharge.   Patients discharged the day of surgery will not be allowed to drive home.  Name and phone number of your driver:  Special Instructions: N/A   Please read over the following fact sheets that you were given: MRSA Information.  Call Cain Sieve RN pre op nurse if needed [phone]    FAILURE TO FOLLOW THESE INSTRUCTIONS MAY RESULT IN THE CANCELLATION OF YOUR SURGERY. PATIENT SIGNATURE___________________________________________

## 2012-04-09 ENCOUNTER — Encounter (HOSPITAL_COMMUNITY): Payer: Self-pay | Admitting: Anesthesiology

## 2012-04-09 ENCOUNTER — Observation Stay (HOSPITAL_COMMUNITY)
Admission: RE | Admit: 2012-04-09 | Discharge: 2012-04-10 | Disposition: A | Discharge status: Home / Self Care | Payer: BC Managed Care – PPO | Source: Ambulatory Visit | Attending: Surgery | Admitting: Surgery

## 2012-04-09 ENCOUNTER — Ambulatory Visit (HOSPITAL_COMMUNITY): Payer: BC Managed Care – PPO | Admitting: Anesthesiology

## 2012-04-09 ENCOUNTER — Encounter (HOSPITAL_COMMUNITY)
Admission: RE | Disposition: A | Discharge status: Home / Self Care | Payer: Self-pay | Source: Ambulatory Visit | Attending: Surgery

## 2012-04-09 ENCOUNTER — Encounter (HOSPITAL_COMMUNITY): Payer: Self-pay | Admitting: Surgery

## 2012-04-09 ENCOUNTER — Encounter (HOSPITAL_COMMUNITY): Payer: Self-pay | Admitting: *Deleted

## 2012-04-09 DIAGNOSIS — E785 Hyperlipidemia, unspecified: Secondary | ICD-10-CM | POA: Insufficient documentation

## 2012-04-09 DIAGNOSIS — M6200 Separation of muscle (nontraumatic), unspecified site: Secondary | ICD-10-CM | POA: Insufficient documentation

## 2012-04-09 DIAGNOSIS — K432 Incisional hernia without obstruction or gangrene: Principal | ICD-10-CM | POA: Insufficient documentation

## 2012-04-09 DIAGNOSIS — Z7982 Long term (current) use of aspirin: Secondary | ICD-10-CM | POA: Insufficient documentation

## 2012-04-09 DIAGNOSIS — E669 Obesity, unspecified: Secondary | ICD-10-CM | POA: Insufficient documentation

## 2012-04-09 DIAGNOSIS — R7309 Other abnormal glucose: Secondary | ICD-10-CM | POA: Insufficient documentation

## 2012-04-09 DIAGNOSIS — G4730 Sleep apnea, unspecified: Secondary | ICD-10-CM | POA: Insufficient documentation

## 2012-04-09 DIAGNOSIS — Z79899 Other long term (current) drug therapy: Secondary | ICD-10-CM | POA: Insufficient documentation

## 2012-04-09 DIAGNOSIS — K430 Incisional hernia with obstruction, without gangrene: Secondary | ICD-10-CM

## 2012-04-09 DIAGNOSIS — I10 Essential (primary) hypertension: Secondary | ICD-10-CM | POA: Insufficient documentation

## 2012-04-09 HISTORY — PX: VENTRAL HERNIA REPAIR: SHX424

## 2012-04-09 HISTORY — PX: INSERTION OF MESH: SHX5868

## 2012-04-09 LAB — CBC
HCT: 47.2 % (ref 39.0–52.0)
Hemoglobin: 16.2 g/dL (ref 13.0–17.0)
MCH: 29.1 pg (ref 26.0–34.0)
MCHC: 34.3 g/dL (ref 30.0–36.0)
MCV: 84.7 fL (ref 78.0–100.0)
Platelets: 242 10*3/uL (ref 150–400)
RBC: 5.57 MIL/uL (ref 4.22–5.81)
RDW: 14.3 % (ref 11.5–15.5)
WBC: 20 10*3/uL — ABNORMAL HIGH (ref 4.0–10.5)

## 2012-04-09 LAB — CREATININE, SERUM: GFR calc Af Amer: 87 mL/min — ABNORMAL LOW (ref >90)

## 2012-04-09 SURGERY — REPAIR, HERNIA, VENTRAL, LAPAROSCOPIC
Anesthesia: General | Site: Abdomen | Wound class: Clean

## 2012-04-09 MED ORDER — ACETAMINOPHEN 325 MG PO TABS: 650.0000 mg | ORAL_TABLET | ORAL | Status: DC | PRN

## 2012-04-09 MED ORDER — HYDROMORPHONE HCL PF 1 MG/ML IJ SOLN
0.2500 mg | INTRAMUSCULAR | Status: DC | PRN
  Administered 2012-04-09 (×2): 0.5 mg via INTRAVENOUS

## 2012-04-09 MED ORDER — HYDROCODONE-ACETAMINOPHEN 5-325 MG PO TABS
1.0000 | ORAL_TABLET | ORAL | Status: DC | PRN
  Administered 2012-04-10 (×3): 2 via ORAL
  Administered 2012-04-10: 1 via ORAL
  Filled 2012-04-09 (×4): qty 2

## 2012-04-09 MED ORDER — BUPIVACAINE-EPINEPHRINE 0.25% -1:200000 IJ SOLN
INTRAMUSCULAR | Status: DC | PRN
  Administered 2012-04-09: 21 mL

## 2012-04-09 MED ORDER — KCL IN DEXTROSE-NACL 30-5-0.45 MEQ/L-%-% IV SOLN
INTRAVENOUS | Status: DC
  Administered 2012-04-09 – 2012-04-10 (×2): via INTRAVENOUS
  Filled 2012-04-09 (×3): qty 1000

## 2012-04-09 MED ORDER — HYDROMORPHONE HCL PF 1 MG/ML IJ SOLN
1.0000 mg | INTRAMUSCULAR | Status: DC | PRN
  Administered 2012-04-09 – 2012-04-10 (×3): 1 mg via INTRAVENOUS
  Filled 2012-04-09 (×3): qty 1

## 2012-04-09 MED ORDER — PROPOFOL 10 MG/ML IV BOLUS
INTRAVENOUS | Status: DC | PRN
  Administered 2012-04-09: 300 mg via INTRAVENOUS

## 2012-04-09 MED ORDER — 0.9 % SODIUM CHLORIDE (POUR BTL) OPTIME
TOPICAL | Status: DC | PRN
  Administered 2012-04-09: 1000 mL

## 2012-04-09 MED ORDER — PNEUMOCOCCAL VAC POLYVALENT 25 MCG/0.5ML IJ INJ
0.5000 mL | INJECTION | INTRAMUSCULAR | Status: DC
  Filled 2012-04-09 (×2): qty 0.5

## 2012-04-09 MED ORDER — LACTATED RINGERS IV SOLN
INTRAVENOUS | Status: DC | PRN
  Administered 2012-04-09 (×2): via INTRAVENOUS

## 2012-04-09 MED ORDER — DEXTROSE 5 % IV SOLN
3.0000 g | INTRAVENOUS | Status: AC
  Administered 2012-04-09: 3 g via INTRAVENOUS
  Filled 2012-04-09: qty 3000

## 2012-04-09 MED ORDER — ACETAMINOPHEN 10 MG/ML IV SOLN
INTRAVENOUS | Status: DC | PRN
  Administered 2012-04-09: 1000 mg via INTRAVENOUS

## 2012-04-09 MED ORDER — MIDAZOLAM HCL 5 MG/5ML IJ SOLN
INTRAMUSCULAR | Status: DC | PRN
  Administered 2012-04-09: 2 mg via INTRAVENOUS

## 2012-04-09 MED ORDER — ONDANSETRON HCL 4 MG/2ML IJ SOLN
4.0000 mg | Freq: Four times a day (QID) | INTRAMUSCULAR | Status: DC | PRN
  Administered 2012-04-09: 4 mg via INTRAVENOUS
  Filled 2012-04-09: qty 2

## 2012-04-09 MED ORDER — AMLODIPINE BESYLATE 10 MG PO TABS
10.0000 mg | ORAL_TABLET | Freq: Every morning | ORAL | Status: DC
  Administered 2012-04-10: 10 mg via ORAL
  Filled 2012-04-09: qty 1

## 2012-04-09 MED ORDER — NEOSTIGMINE METHYLSULFATE 1 MG/ML IJ SOLN
INTRAMUSCULAR | Status: DC | PRN
  Administered 2012-04-09: 5 mg via INTRAVENOUS

## 2012-04-09 MED ORDER — BIOTENE DRY MOUTH MT LIQD
15.0000 mL | Freq: Two times a day (BID) | OROMUCOSAL | Status: DC
  Administered 2012-04-09 – 2012-04-10 (×2): 15 mL via OROMUCOSAL

## 2012-04-09 MED ORDER — KETOROLAC TROMETHAMINE 30 MG/ML IJ SOLN: 15.0000 mg | Freq: Once | INTRAMUSCULAR | Status: DC | PRN

## 2012-04-09 MED ORDER — ONDANSETRON HCL 4 MG/2ML IJ SOLN
INTRAMUSCULAR | Status: DC | PRN
  Administered 2012-04-09: 4 mg via INTRAVENOUS

## 2012-04-09 MED ORDER — LACTATED RINGERS IR SOLN
Status: DC | PRN
  Administered 2012-04-09: 1000 mL

## 2012-04-09 MED ORDER — LACTATED RINGERS IV SOLN
INTRAVENOUS | Status: DC
  Administered 2012-04-09: 1000 mL via INTRAVENOUS

## 2012-04-09 MED ORDER — ENOXAPARIN SODIUM 40 MG/0.4ML ~~LOC~~ SOLN
40.0000 mg | SUBCUTANEOUS | Status: DC
  Administered 2012-04-10: 40 mg via SUBCUTANEOUS
  Filled 2012-04-09: qty 0.4

## 2012-04-09 MED ORDER — METFORMIN HCL 500 MG PO TABS
1000.0000 mg | ORAL_TABLET | Freq: Two times a day (BID) | ORAL | Status: DC
  Administered 2012-04-09 – 2012-04-10 (×3): 1000 mg via ORAL
  Filled 2012-04-09 (×4): qty 2

## 2012-04-09 MED ORDER — PROMETHAZINE HCL 25 MG/ML IJ SOLN: 6.2500 mg | INTRAMUSCULAR | Status: DC | PRN

## 2012-04-09 MED ORDER — GLYCOPYRROLATE 0.2 MG/ML IJ SOLN
INTRAMUSCULAR | Status: DC | PRN
  Administered 2012-04-09: .7 mg via INTRAVENOUS

## 2012-04-09 MED ORDER — ROCURONIUM BROMIDE 100 MG/10ML IV SOLN
INTRAVENOUS | Status: DC | PRN
  Administered 2012-04-09: 50 mg via INTRAVENOUS

## 2012-04-09 MED ORDER — FENTANYL CITRATE 0.05 MG/ML IJ SOLN
INTRAMUSCULAR | Status: DC | PRN
  Administered 2012-04-09: 50 ug via INTRAVENOUS
  Administered 2012-04-09 (×2): 100 ug via INTRAVENOUS

## 2012-04-09 MED ORDER — HYDROMORPHONE HCL PF 1 MG/ML IJ SOLN
INTRAMUSCULAR | Status: DC | PRN
  Administered 2012-04-09: 1 mg via INTRAVENOUS

## 2012-04-09 MED ORDER — ONDANSETRON HCL 4 MG PO TABS: 4.0000 mg | ORAL_TABLET | Freq: Four times a day (QID) | ORAL | Status: DC | PRN

## 2012-04-09 MED ORDER — LIDOCAINE HCL (CARDIAC) 20 MG/ML IV SOLN
INTRAVENOUS | Status: DC | PRN
  Administered 2012-04-09: 100 mg via INTRAVENOUS

## 2012-04-09 SURGICAL SUPPLY — 41 items
BENZOIN TINCTURE PRP APPL 2/3 (GAUZE/BANDAGES/DRESSINGS) ×2 IMPLANT
BINDER ABD UNIV 12 45-62 (WOUND CARE) ×1 IMPLANT
BINDER ABDOMINAL 46IN 62IN (WOUND CARE) ×2
CANISTER SUCTION 2500CC (MISCELLANEOUS) ×2 IMPLANT
CHLORAPREP W/TINT 10.5 ML (MISCELLANEOUS) ×2 IMPLANT
CLOTH BEACON ORANGE TIMEOUT ST (SAFETY) ×2 IMPLANT
DECANTER SPIKE VIAL GLASS SM (MISCELLANEOUS) ×2 IMPLANT
DEVICE SECURE STRAP 25 ABSORB (INSTRUMENTS) ×4 IMPLANT
DEVICE TROCAR PUNCTURE CLOSURE (ENDOMECHANICALS) ×2 IMPLANT
DISSECTOR BLUNT TIP ENDO 5MM (MISCELLANEOUS) IMPLANT
DRAPE INCISE IOBAN 66X45 STRL (DRAPES) IMPLANT
DRAPE LAPAROSCOPIC ABDOMINAL (DRAPES) ×2 IMPLANT
ELECT REM PT RETURN 9FT ADLT (ELECTROSURGICAL) ×2
ELECTRODE REM PT RTRN 9FT ADLT (ELECTROSURGICAL) ×1 IMPLANT
GLOVE BIOGEL PI IND STRL 7.0 (GLOVE) ×1 IMPLANT
GLOVE BIOGEL PI INDICATOR 7.0 (GLOVE) ×1
GLOVE SURG ORTHO 8.0 STRL STRW (GLOVE) ×2 IMPLANT
GOWN STRL NON-REIN LRG LVL3 (GOWN DISPOSABLE) ×2 IMPLANT
GOWN STRL REIN XL XLG (GOWN DISPOSABLE) ×6 IMPLANT
HAND ACTIVATED (MISCELLANEOUS) IMPLANT
KIT BASIN OR (CUSTOM PROCEDURE TRAY) ×2 IMPLANT
MARKER SKIN DUAL TIP RULER LAB (MISCELLANEOUS) ×2 IMPLANT
MESH VENTRALIGHT ST 6IN CRC (Mesh General) ×2 IMPLANT
NEEDLE SPNL 22GX3.5 QUINCKE BK (NEEDLE) ×8 IMPLANT
PENCIL BUTTON HOLSTER BLD 10FT (ELECTRODE) IMPLANT
SCISSORS LAP 5X35 DISP (ENDOMECHANICALS) ×2 IMPLANT
SET IRRIG TUBING LAPAROSCOPIC (IRRIGATION / IRRIGATOR) IMPLANT
SLEEVE XCEL OPT CAN 5 100 (ENDOMECHANICALS) ×2 IMPLANT
SOLUTION ANTI FOG 6CC (MISCELLANEOUS) ×2 IMPLANT
STRIP CLOSURE SKIN 1/2X4 (GAUZE/BANDAGES/DRESSINGS) ×4 IMPLANT
SUT NOVA 0 T19/GS 22DT (SUTURE) IMPLANT
SUT NOVA T20/GS 25 (SUTURE) ×4 IMPLANT
TACKER 5MM HERNIA 3.5CML NAB (ENDOMECHANICALS) IMPLANT
TOWEL OR 17X26 10 PK STRL BLUE (TOWEL DISPOSABLE) ×2 IMPLANT
TRAY FOLEY CATH 14FRSI W/METER (CATHETERS) ×2 IMPLANT
TRAY LAP CHOLE (CUSTOM PROCEDURE TRAY) ×2 IMPLANT
TROCAR HASSON GELL 12X100 (TROCAR) IMPLANT
TROCAR XCEL NON-BLD 11X100MML (ENDOMECHANICALS) ×2 IMPLANT
TROCAR Z-THREAD FIOS 5X100MM (TROCAR) ×2 IMPLANT
TROCAR Z-THREAD SLEEVE 11X100 (TROCAR) ×2 IMPLANT
TUBING INSUFFLATION 10FT LAP (TUBING) ×2 IMPLANT

## 2012-04-09 NOTE — Transfer of Care (Signed)
Immediate Anesthesia Transfer of Care Note  Patient: Oscar [name]  Procedure(s) Performed: Procedure(s) (LRB) with comments: LAPAROSCOPIC VENTRAL HERNIA (N/A) - Laparoscopic Ventral Incisional Hernia Repair with Mesh INSERTION OF MESH (N/A)  Patient Location: PACU  Anesthesia Type:General  Level of Consciousness: awake, alert , oriented and patient cooperative  Airway & Oxygen Therapy: Patient Spontanous Breathing and Patient connected to face mask oxygen  Post-op Assessment: Report given to PACU RN, Post -op Vital signs reviewed and stable and Patient moving all extremities  Post vital signs: Reviewed and stable  Complications: No apparent anesthesia complications

## 2012-04-09 NOTE — Brief Op Note (Signed)
04/09/2012  1:34 PM  PATIENT:  Oscar [name]  52 y.o. male  PRE-OPERATIVE DIAGNOSIS:  Recurrent ventral incisional hernia, incarcerated  POST-OPERATIVE DIAGNOSIS:  same  PROCEDURE:  Procedure(s) (LRB) with comments: LAPAROSCOPIC VENTRAL HERNIA (N/A) - Laparoscopic Ventral Incisional Hernia Repair with Mesh INSERTION OF MESH (N/A)  SURGEON:  Surgeon(s) and Role:    * Velora Heckler, MD - Primary  ANESTHESIA:   general  EBL:  Total I/O In: 1000 [I.V.:1000] Out: -   BLOOD ADMINISTERED:none  DRAINS: none   LOCAL MEDICATIONS USED:  MARCAINE     SPECIMEN:  No Specimen  DISPOSITION OF SPECIMEN:  N/A  COUNTS:  YES  TOURNIQUET:  * No tourniquets in log *  DICTATION: .Other Dictation: Dictation Number [id number]  PLAN OF CARE: Admit for overnight observation  PATIENT DISPOSITION:  PACU - hemodynamically stable.   Delay start of Pharmacological VTE agent (>24hrs) due to surgical blood loss or risk of bleeding: yes  Velora Heckler, MD, [facility] Office: [phone]

## 2012-04-09 NOTE — Anesthesia Postprocedure Evaluation (Signed)
  Anesthesia Post-op Note  Patient: Oscar [name]  Procedure(s) Performed: Procedure(s) (LRB): LAPAROSCOPIC VENTRAL HERNIA (N/A) INSERTION OF MESH (N/A)  Patient Location: PACU  Anesthesia Type: General  Level of Consciousness: awake and alert   Airway and Oxygen Therapy: Patient Spontanous Breathing  Post-op Pain: mild  Post-op Assessment: Post-op Vital signs reviewed, Patient's Cardiovascular Status Stable, Respiratory Function Stable, Patent Airway and No signs of Nausea or vomiting  Last Vitals:  Filed Vitals:   04/09/12 1415  BP: 140/86  Pulse: 83  Temp:   Resp: 14    Post-op Vital Signs: stable   Complications: No apparent anesthesia complications

## 2012-04-09 NOTE — Preoperative (Signed)
Beta Blockers   Reason not to administer Beta Blockers:Not Applicable 

## 2012-04-09 NOTE — Anesthesia Preprocedure Evaluation (Addendum)
Anesthesia Evaluation  Patient identified by MRN, date of birth, ID band Patient awake    Reviewed: Allergy & Precautions, H&P , NPO status , Patient's Chart, lab work & pertinent test results  Airway Mallampati: III TM Distance: <3 FB Neck ROM: Full    Dental  (+) Dental Advisory Given,    Pulmonary sleep apnea and Continuous Positive Airway Pressure Ventilation ,  breath sounds clear to auscultation  Pulmonary exam normal       Cardiovascular negative cardio ROS  Rhythm:Regular Rate:Normal     Neuro/Psych negative neurological ROS  negative psych ROS   GI/Hepatic negative GI ROS, Neg liver ROS,   Endo/Other  diabetesMorbid obesity  Renal/GU negative Renal ROS  negative genitourinary   Musculoskeletal negative musculoskeletal ROS (+)   Abdominal   Peds negative pediatric ROS (+)  Hematology negative hematology ROS (+)   Anesthesia Other Findings   Reproductive/Obstetrics negative OB ROS                          Anesthesia Physical Anesthesia Plan  ASA: III  Anesthesia Plan: General   Post-op Pain Management:    Induction: Intravenous  Airway Management Planned: Oral ETT  Additional Equipment:   Intra-op Plan:   Post-operative Plan: Extubation in OR  Informed Consent: I have reviewed the patients History and Physical, chart, labs and discussed the procedure including the risks, benefits and alternatives for the proposed anesthesia with the patient or authorized representative who has indicated his/her understanding and acceptance.   Dental advisory given  Plan Discussed with: CRNA and Surgeon  Anesthesia Plan Comments:         Anesthesia Quick Evaluation

## 2012-04-09 NOTE — H&P (Signed)
Oscar [name] is an 52 y.o. male.    Chief Complaint: recurrent incisional hernia  HPI: The patient is a 52 year old white male referred for evaluation of a recurrent ventral incisional hernia. Patient apparently had undergone laparoscopic appendectomy approximately 10 years ago. He subsequently developed a hernia at the level of the umbilicus. He has had for additional procedures to repair the hernia, all of which have failed. He is minimally symptomatic. He occasionally has to discomfort especially with pressure against the abdominal wall. He actively exercises and lifts weights and this results in some discomfort. A CT scan of the abdomen and pelvis performed March 2013 shows a recurrent hernia at the level of the umbilicus containing adipose tissue. There is no sign of intestinal obstruction. Patient has also noted a rectus diastasis. He presents today for evaluation and further surgical recommendations.   Other than appendectomy and hernia repairs at the level of the umbilicus, the patient has had no other abdominal surgical procedures.   Past Medical History  Diagnosis Date  . Hyperlipidemia   . Chickenpox   . Obesity   . Hypertension     he also sees Dr. Shellee Milo in [place], [place]   . Asthma     weather related, no inhalers  . Sleep apnea 2003    CPAP machine, pt does not know settings  . Nephrolithiasis     2-3 kidney stones in past  . Hepatitis A     as a child, no current liver problems    Past Surgical History  Procedure Date  . Colonoscopy 07-09-09    benign polyp repeat 5 years, Dr.Sam Evette Cristal  . Tonsillectomy as child  . Uvulectomy yrs ago    for snoring  . Appendectomy  10-79yrs ago  . Hernia repair     repaired x 4    Family History  Problem Relation Age of Onset  . Alcohol abuse    . Diabetes    . Hypertension    . Cancer      lung  . Stroke    . Obesity     Social History:  reports that he has never smoked. He has never used smokeless tobacco. He reports  that he drinks alcohol. He reports that he does not use illicit drugs.  Allergies: No Known Allergies  Medications Prior to Admission  Medication Sig Dispense Refill  . amLODipine (NORVASC) 10 MG tablet Take 10 mg by mouth every morning.      Marland Kitchen aspirin EC 81 MG tablet Take 81 mg by mouth daily.      Marland Kitchen atorvastatin (LIPITOR) 10 MG tablet Take 10 mg by mouth every morning.      Marland Kitchen b complex vitamins tablet Take 1 tablet by mouth every morning.       . cetirizine (ZYRTEC) 10 MG tablet Take 10 mg by mouth every morning.       . Cholecalciferol (VITAMIN D3) 5000 UNITS TABS Take 1 tablet by mouth at bedtime.       . Cinnamon 500 MG TABS Take 500 mg by mouth daily.      Marland Kitchen DHEA 25 MG CAPS Take 25 mg by mouth daily.      . fish oil-omega-3 fatty acids 1000 MG capsule Take 1 g by mouth 2 (two) times daily.      . metFORMIN (GLUCOPHAGE) 500 MG tablet Take 1,000 mg by mouth 2 (two) times daily with a meal.      . niacin 500 MG tablet Take 500  mg by mouth at bedtime.       . Nutritional Supplements (DHEA PO) Take by mouth. 25 mg once day      . Probiotic Product (PROBIOTIC FORMULA PO) Take 1 tablet by mouth at bedtime.       . psyllium (REGULOID) 0.52 G capsule Take 1.04 g by mouth 2 (two) times daily.         No results found for this or any previous visit (from the past 48 hour(s)). No results found.  Review of Systems  Constitutional: Negative.   HENT: Negative.   Eyes: Negative.   Respiratory: Negative.   Cardiovascular: Negative.   Gastrointestinal: Negative.   Genitourinary: Negative.   Musculoskeletal: Negative.   Skin: Negative.   Neurological: Negative.   Endo/Heme/Allergies: Negative.   Psychiatric/Behavioral: Negative.     Blood pressure 154/93, pulse 91, temperature 97.6 F (36.4 C), temperature source Oral, resp. rate 18, SpO2 96.00%. Physical Exam  Constitutional: He is oriented to person, place, and time. He appears well-developed and well-nourished.  HENT:  Head:  Normocephalic and atraumatic.  Right Ear: External ear normal.  Left Ear: External ear normal.  Mouth/Throat: Oropharynx is clear and moist.  Eyes: Conjunctivae normal are normal. Pupils are equal, round, and reactive to light. No scleral icterus.  Neck: Normal range of motion. Neck supple. No tracheal deviation present. No thyromegaly present.  Cardiovascular: Normal rate, regular rhythm, normal heart sounds and intact distal pulses.   No murmur heard. Respiratory: Effort normal and breath sounds normal. He has no wheezes.  GI: Soft. Bowel sounds are normal. He exhibits no distension and no mass. There is no tenderness. There is no rebound.       Recurrent hernia at umbilicus approx 3 cm fascial defect, reducible  Musculoskeletal: Normal range of motion. He exhibits no edema.  Lymphadenopathy:    He has no cervical adenopathy.  Neurological: He is alert and oriented to person, place, and time.  Skin: Skin is warm and dry.  Psychiatric: He has a normal mood and affect. His behavior is normal.     Assessment/Plan #1 recurrent ventral incisional hernia at level of umbilicus, reducible, mildly symptomatic  #2 rectus diastasis, moderate  #3 hypertension  #4 hyperglycemia  I discussed at length with the patient the options for surgical management. If the patient desires to restore the contour of his abdominal wall and repair the rectus diastases at the same time as repairing his umbilical/incisional hernia, then he needs to consider plastic surgery consultation for abdominoplasty. If the patient's goal is repair of his incisional hernia at the level of the umbilicus, then I believe a laparoscopic approach with a wide overlay of mesh is the appropriate course.   Patient and I discussed this at length. I recommended laparoscopic repair of his incisional hernia with mesh. We have discussed the hospital stay to be anticipated. We have discussed potential complications including infection,  recurrence, and failure to achieve desired cosmetic results. He understands and wishes to proceed with surgery in the near future. We will make arrangements for his procedure at a time convenient for him. We have discussed the restrictions on his physical activities and on his exercise program following the procedure. He understands and agrees to comply.   The risks and benefits of the procedure have been discussed at length with the patient. The patient understands the proposed procedure, potential alternative treatments, and the course of recovery to be expected. All of the patient's questions have been answered at this  time. The patient wishes to proceed with surgery.   Velora Heckler, MD, FACS  General & Endocrine Surgery  [facility]   Jazzlin Clements M 04/09/2012, 11:58 AM

## 2012-04-10 ENCOUNTER — Encounter (HOSPITAL_COMMUNITY): Payer: Self-pay | Admitting: Surgery

## 2012-04-10 MED ORDER — KETOROLAC TROMETHAMINE 15 MG/ML IJ SOLN
15.0000 mg | Freq: Four times a day (QID) | INTRAMUSCULAR | Status: DC
  Administered 2012-04-10 (×2): 15 mg via INTRAVENOUS
  Filled 2012-04-10 (×2): qty 1

## 2012-04-10 MED ORDER — KETOROLAC TROMETHAMINE 15 MG/ML IJ SOLN
15.0000 mg | Freq: Once | INTRAMUSCULAR | Status: AC
  Administered 2012-04-10: 15 mg via INTRAVENOUS
  Filled 2012-04-10: qty 1

## 2012-04-10 NOTE — Progress Notes (Signed)
Pt dangled at 2100 on 04/09/2012.  Given IV pain medication at that time for quick pain relief.  Pt told about importance of switching to oral pain medication.  At 0200 on 04/10/2012 pt requested pain medication.  Encouraged to switch or try oral pain medication.  Pt refused at present time saying he just wanted the quick fix.  Encouraged to take both Dilaudid and oral pain medication to get the short and long lasting pain medication.  Pt refused.  York Spaniel he would try it next time.  Offered to ambulate pt at 2200 last night and 0200 this am, refused both times.  Offered to dangle at both times as well, also refused.  Pt educated on importance of ambulation after surgery.  Pt still refuses at this time.    Sherron Monday

## 2012-04-10 NOTE — Progress Notes (Signed)
Patient ID: Oscar [name], male   DOB: 1960-11-01, 52 y.o.   MRN: [id number]  General Surgery - [facility] - Progress Note  POD# 1  Subjective: Patient in bed.  Has dangled at bedside but has not been OOB, ambulating yet.  No nausea or emesis.  Tolerated clear liquids.  Objective: Vital signs in last 24 hours: Temp:  [97.5 F (36.4 C)-98.3 F (36.8 C)] 97.6 F (36.4 C) (01/10 0605) Pulse Rate:  [81-93] 93  (01/10 0605) Resp:  [12-20] 20  (01/10 0605) BP: (127-161)/(75-92) 139/84 mmHg (01/10 0605) SpO2:  [92 %-100 %] 95 % (01/10 0605) Weight:  [283 lb (128.368 kg)] 283 lb (128.368 kg) (01/09 1700) Last BM Date: 04/09/12  Intake/Output from previous day: 01/09 0701 - 01/10 0700 In: 3211.3 [P.O.:480; I.V.:2731.3] Out: 2305 [Urine:2300; Blood:5]  Exam: HEENT - clear, not icteric Neck - soft Chest - clear bilaterally Cor - RRR, no murmur Abd - soft, dressings dry and intact; binder removed and replaced; small seroma at umbilicus Ext - no significant edema Neuro - grossly intact, no focal deficits  Lab Results:   Basename 04/09/12 1550  WBC 20.0*  HGB 16.2  HCT 47.2  PLT 242     Basename 04/09/12 1550  NA --  K --  CL --  CO2 --  GLUCOSE --  BUN --  CREATININE 1.11  CALCIUM --    Studies/Results: No results found.  Assessment / Plan: 1.  Status post ventral incisional hernia repair with mesh  Add Toradol IV for pain control  Encouraged to ambulate this AM  Regular diet for breakfast  Probably home later today  Velora Heckler, MD, [facility] Office: [phone]  04/10/2012

## 2012-04-10 NOTE — Care Management Note (Signed)
    Page 1 of 1   04/10/2012     4:11:55 PM   CARE MANAGEMENT NOTE 04/10/2012  Patient:  Oscar [name], Oscar [name]   Account Number:  [id number]  Date Initiated:  04/10/2012  Documentation initiated by:  Lorenda Ishihara  Subjective/Objective Assessment:   52 yo male admitted s/p hernia repair. PTA lived at home with spouse.     Action/Plan:   Home when stable   Anticipated DC Date:  04/10/2012   Anticipated DC Plan:  HOME/SELF CARE      DC Planning Services  CM consult      Choice offered to / List presented to:             Status of service:  Completed, signed off Medicare Important Message given?   (If response is "NO", the following Medicare IM given date fields will be blank) Date Medicare IM given:   Date Additional Medicare IM given:    Discharge Disposition:  HOME/SELF CARE  Per UR Regulation:  Reviewed for med. necessity/level of care/duration of stay  If discussed at Long Length of Stay Meetings, dates discussed:    Comments:

## 2012-04-10 NOTE — Op Note (Signed)
NAMESANDON, YOHO NO.:  [id number]  MEDICAL RECORD NO.:  [id number]  LOCATION:  1538                         FACILITY:  [facility]  PHYSICIAN:  Velora Heckler, MD      DATE OF BIRTH:  [birth year]  DATE OF PROCEDURE:  04/09/2012                               OPERATIVE REPORT   PREOPERATIVE DIAGNOSIS:  Recurrent ventral incisional hernia, incarcerated.  POSTOPERATIVE DIAGNOSIS:  Recurrent ventral incisional hernia, incarcerated.  PROCEDURE:  Laparoscopic repair of recurrent ventral incisional hernia with Bard soft mesh.  SURGEON:  Velora Heckler, MD, FACS  ANESTHESIA:  General per Dr. Eilene Ghazi.  ESTIMATED BLOOD LOSS:  Minimal.  PREPARATION:  ChloraPrep.  COMPLICATIONS:  None.  INDICATIONS:  The patient is a 52 year old white male with recurrent ventral incisional hernia.  He had undergone laparoscopic appendectomy 10 years ago.  He developed a hernia at the umbilicus.  He has had two additional open repairs, which have failed.  He remains mildly symptomatic with discomfort especially when lifting.  CT scan showed a recurrent hernia at the level of the umbilicus containing incarcerated adipose tissue.  He has had no signs of intestinal obstruction.  He now comes to Surgery for repair.  BODY OF REPORT:  Procedure was done in OR #1 at the [facility].  The patient was brought to the operating room and placed in supine position on the operating room table.  Following administration of general endotracheal anesthesia, the patient was positioned and then prepped and draped in the usual strict aseptic fashion.  After ascertaining that an adequate level of anesthesia had been achieved, an incision was made in the left upper quadrant of the abdominal wall at the lateral costal margin.  Using a 5-mm Optiview trocar and a 5-mm 0-degree scope, the peritoneal cavity was safely entered and insufflated with carbon dioxide.  Operative ports  were placed in the left lower quadrant, right lower quadrant, and right upper quadrant under direct vision.  Incarcerated adipose tissue being largely omentum was mobilized out of the subcutaneous space and reduced back within the peritoneal cavity. Adhesions were divided with the electrocautery and blunt dissection. There was no bowel present within the hernia sac.  After clearing all adhesions, the hernia defect measured approximately 3 cm in diameter.  A 15-cm Bard soft mesh round shape was selected for the repair.  Remainder of the anterior abdominal wall was inspected and there were no other fascial defects identified.  Six #1 Novafil sutures were placed circumferentially around the mesh.  Mesh was moistened, rolled, and inserted through the 11-mm trocar into the peritoneal cavity.  It was deployed and oriented.  All six sutures were then retrieved with the Endocatch through the abdominal wall.  All six sutures were pulled taut bringing the mesh into approximation with the anterior abdominal wall with wide overlap of the fascial defect.  All six sutures were tied securely.  Using a SecureStrap tacking device, two concentric rows of tacks were placed around the periphery of the mesh with again nice approximation of the mesh to the anterior abdominal wall and wide overlap of the fascia defect in all directions.  Good hemostasis was noted.  Ports were removed under direct vision and good hemostasis was noted at the port sites.  Pneumoperitoneum was released.  Port sites were anesthetized with local anesthetic.  Wounds were closed with interrupted 4-0 Monocryl subcuticular sutures.  Wounds were washed and dried and benzoin and Steri-Strips were applied.  Sterile dressings were applied.  The patient was awakened from anesthesia and brought to the recovery room.  The patient tolerated the procedure well.   Velora Heckler, MD, [facility] Office:  [phone]    TMG/MEDQ  D:  04/09/2012  T:  04/10/2012  Job:  [id number]

## 2012-04-10 NOTE — Progress Notes (Signed)
Patient ID: Oscar [name], male   DOB: 06-22-1960, 52 y.o.   MRN: [id number]  General Surgery [facility]  Patient ambulated to desk and back to room.  Moderate pain.  Voiding normally.  Taking po without nausea.  May discharge home later today if pain improves.  Otherwise home in AM 04/11/2012.  Velora Heckler, MD, [facility] Office: [phone]

## 2012-04-13 ENCOUNTER — Telehealth (INDEPENDENT_AMBULATORY_CARE_PROVIDER_SITE_OTHER): Payer: Self-pay

## 2012-04-13 NOTE — Telephone Encounter (Signed)
LMOM with appt date and call with any concerns.

## 2012-04-13 NOTE — Discharge Summary (Signed)
  Physician Discharge Summary [facility]  Patient ID: Oscar [name] MRN: [id number] DOB/AGE: 1960/08/30 52 y.o.  Admit date: 04/09/2012 Discharge date: 04/10/2012   Admission Diagnoses:  Recurrent ventral incisional hernia, incarcerated  Discharge Diagnoses:  Principal Problem:  *Incisional hernia, recurrent   Discharged Condition: good  Hospital Course: patient admitted on day of surgery and underwent laparoscopic ventral incisional hernia repair with mesh patch.  Post op course uncomplicated.  Moderate pain post op.  Improved with addition of Toradol post op.  Prepared for discharge late on POD#1.  Consults: None  Significant Diagnostic Studies: none  Treatments: surgery: laparoscopic ventral incisional hernia repair with 15 cm Bard SoftMesh patch  Discharge Exam: Blood pressure 153/95, pulse 115, temperature 97.7 F (36.5 C), temperature source Oral, resp. rate 18, height 6\' 2"  (1.88 m), weight 283 lb (128.368 kg), SpO2 90.00%. HEENT - clear Chest - clear bilat Cor - RRR Abdomen - binder on; dressings dry and intact; minimal STS  Disposition: Home with family  Discharge Orders    Future Appointments: Provider: Department: Dept Phone: Center:   04/15/2012 9:30 AM Velora Heckler, MD [facility], [place] [phone] None       Medication List     As of 04/13/2012  1:56 PM    TAKE these medications         amLODipine 10 MG tablet   Commonly known as: NORVASC   Take 10 mg by mouth every morning.      aspirin EC 81 MG tablet   Take 81 mg by mouth daily.      atorvastatin 10 MG tablet   Commonly known as: LIPITOR   Take 10 mg by mouth every morning.      b complex vitamins tablet   Take 1 tablet by mouth every morning.      cetirizine 10 MG tablet   Commonly known as: ZYRTEC   Take 10 mg by mouth every morning.      Cinnamon 500 MG Tabs   Take 500 mg by mouth daily.      DHEA 25 MG Caps   Take 25 mg by mouth daily.      DHEA  PO   Take by mouth. 25 mg once day      fish oil-omega-3 fatty acids 1000 MG capsule   Take 1 g by mouth 2 (two) times daily.      metFORMIN 500 MG tablet   Commonly known as: GLUCOPHAGE   Take 1,000 mg by mouth 2 (two) times daily with a meal.      niacin 500 MG tablet   Take 500 mg by mouth at bedtime.      PROBIOTIC FORMULA PO   Take 1 tablet by mouth at bedtime.      psyllium 0.52 G capsule   Commonly known as: REGULOID   Take 1.04 g by mouth 2 (two) times daily.      Vitamin D3 5000 UNITS Tabs   Take 1 tablet by mouth at bedtime.         Velora Heckler, MD, [facility] Office: [phone]   Signed: Velora Heckler 04/13/2012, 1:56 PM

## 2012-04-14 ENCOUNTER — Telehealth (INDEPENDENT_AMBULATORY_CARE_PROVIDER_SITE_OTHER): Payer: Self-pay

## 2012-04-14 NOTE — Telephone Encounter (Signed)
Patient called in concerned with pain yesterday. He states he had minimal pain after surgery until yesterday. He states he was in more pain yesterday and had to take a pain pill in pm. He denies any increase in swelling, no fever or any other symptoms except pain. I told him it wasn't uncommon to have days with more pain than others and he is only 5 days out from surgery and could expect this for weeks to come. He is going to follow up with Dekin tomorrow at his scheduled appt.

## 2012-04-15 ENCOUNTER — Encounter (INDEPENDENT_AMBULATORY_CARE_PROVIDER_SITE_OTHER): Payer: Self-pay | Admitting: Surgery

## 2012-04-15 ENCOUNTER — Ambulatory Visit (INDEPENDENT_AMBULATORY_CARE_PROVIDER_SITE_OTHER): Payer: BC Managed Care – PPO | Admitting: Surgery

## 2012-04-15 VITALS — BP 160/74 | HR 76 | Temp 97.4°F | Resp 16 | Ht 74.0 in | Wt 278.6 lb

## 2012-04-15 DIAGNOSIS — K432 Incisional hernia without obstruction or gangrene: Secondary | ICD-10-CM

## 2012-04-15 MED ORDER — PROMETHAZINE HCL 50 MG PO TABS: 25.0000 mg | ORAL_TABLET | Freq: Four times a day (QID) | ORAL | Status: DC | PRN

## 2012-04-15 NOTE — Progress Notes (Signed)
General Surgery [facility]  Visit Diagnoses: 1. Incisional hernia, recurrent     HISTORY: The patient returns for her first postoperative visit having undergone laparoscopic ventral incisional hernia repair with mesh. Postoperative course has been straightforward. He has had moderate pain. He continues to have mild nausea. He has had one episode of emesis.  EXAM: Abdominal wall shows healing surgical incisions with Steri-Strips still in place. Mild seroma formation. No sign of infection. With Valsalva there is no sign of recurrence.  IMPRESSION: Status post laparoscopic ventral incisional hernia repair with mesh  PLAN: Patient is still restricted in his physical activity. I have ordered Phenergan tablets as needed for nausea.  He will return to see me for wound check in 3 weeks.  Velora Heckler, MD, FACS General & Endocrine Surgery [facility]

## 2012-04-15 NOTE — Patient Instructions (Signed)
  COCOA BUTTER & VITAMIN E CREAM  ([employer] or other brand)  Apply cocoa butter/vitamin E cream to your incision 2 - 3 times daily.  Massage cream into incision for one minute with each application.  Use sunscreen (50 SPF or higher) for first 6 months after surgery if area is exposed to sun.  You may substitute Mederma or other scar reducing creams as desired.   

## 2012-04-17 ENCOUNTER — Telehealth (INDEPENDENT_AMBULATORY_CARE_PROVIDER_SITE_OTHER): Payer: Self-pay | Admitting: General Surgery

## 2012-04-17 ENCOUNTER — Inpatient Hospital Stay (HOSPITAL_COMMUNITY)
Admission: EM | Admit: 2012-04-17 | Discharge: 2012-04-20 | DRG: 188 | Disposition: A | Discharge status: Home / Self Care | Payer: BC Managed Care – PPO | Attending: General Surgery | Admitting: General Surgery

## 2012-04-17 ENCOUNTER — Emergency Department (HOSPITAL_COMMUNITY): Payer: BC Managed Care – PPO

## 2012-04-17 ENCOUNTER — Encounter (HOSPITAL_COMMUNITY): Payer: Self-pay | Admitting: *Deleted

## 2012-04-17 DIAGNOSIS — Y838 Other surgical procedures as the cause of abnormal reaction of the patient, or of later complication, without mention of misadventure at the time of the procedure: Secondary | ICD-10-CM | POA: Diagnosis present

## 2012-04-17 DIAGNOSIS — E785 Hyperlipidemia, unspecified: Secondary | ICD-10-CM | POA: Diagnosis present

## 2012-04-17 DIAGNOSIS — Z7982 Long term (current) use of aspirin: Secondary | ICD-10-CM

## 2012-04-17 DIAGNOSIS — E669 Obesity, unspecified: Secondary | ICD-10-CM | POA: Diagnosis present

## 2012-04-17 DIAGNOSIS — I10 Essential (primary) hypertension: Secondary | ICD-10-CM | POA: Diagnosis present

## 2012-04-17 DIAGNOSIS — Z8619 Personal history of other infectious and parasitic diseases: Secondary | ICD-10-CM

## 2012-04-17 DIAGNOSIS — K560 Paralytic ileus: Secondary | ICD-10-CM | POA: Diagnosis present

## 2012-04-17 DIAGNOSIS — J45909 Unspecified asthma, uncomplicated: Secondary | ICD-10-CM | POA: Diagnosis present

## 2012-04-17 DIAGNOSIS — R112 Nausea with vomiting, unspecified: Secondary | ICD-10-CM | POA: Diagnosis present

## 2012-04-17 DIAGNOSIS — G4730 Sleep apnea, unspecified: Secondary | ICD-10-CM | POA: Diagnosis present

## 2012-04-17 DIAGNOSIS — Z6834 Body mass index (BMI) 34.0-34.9, adult: Secondary | ICD-10-CM

## 2012-04-17 DIAGNOSIS — K929 Disease of digestive system, unspecified: Principal | ICD-10-CM | POA: Diagnosis present

## 2012-04-17 DIAGNOSIS — Z79899 Other long term (current) drug therapy: Secondary | ICD-10-CM

## 2012-04-17 DIAGNOSIS — Z833 Family history of diabetes mellitus: Secondary | ICD-10-CM

## 2012-04-17 DIAGNOSIS — Y92009 Unspecified place in unspecified non-institutional (private) residence as the place of occurrence of the external cause: Secondary | ICD-10-CM

## 2012-04-17 DIAGNOSIS — Z87442 Personal history of urinary calculi: Secondary | ICD-10-CM

## 2012-04-17 DIAGNOSIS — Z9089 Acquired absence of other organs: Secondary | ICD-10-CM

## 2012-04-17 LAB — CBC WITH DIFFERENTIAL/PLATELET
Basophils Absolute: 0 10*3/uL (ref 0.0–0.1)
Basophils Relative: 0 % (ref 0–1)
Eosinophils Relative: 1 % (ref 0–5)
HCT: 48.6 % (ref 39.0–52.0)
MCHC: 34.8 g/dL (ref 30.0–36.0)
MCV: 83.9 fL (ref 78.0–100.0)
Monocytes Absolute: 1.1 10*3/uL — ABNORMAL HIGH (ref 0.1–1.0)
RDW: 13.9 % (ref 11.5–15.5)

## 2012-04-17 LAB — COMPREHENSIVE METABOLIC PANEL
AST: 39 U/L — ABNORMAL HIGH (ref 0–37)
Albumin: 3.3 g/dL — ABNORMAL LOW (ref 3.5–5.2)
Calcium: 9.4 mg/dL (ref 8.4–10.5)
Creatinine, Ser: 1.06 mg/dL (ref 0.50–1.35)
GFR calc non Af Amer: 80 mL/min — ABNORMAL LOW (ref >90)

## 2012-04-17 LAB — GLUCOSE, CAPILLARY
Glucose-Capillary: 93 mg/dL (ref 70–99)
Glucose-Capillary: 118 mg/dL — ABNORMAL HIGH (ref 70–99)

## 2012-04-17 LAB — HEMOGLOBIN A1C: Mean Plasma Glucose: 126 mg/dL — ABNORMAL HIGH (ref <117)

## 2012-04-17 MED ORDER — LIP MEDEX EX OINT
1.0000 "application " | TOPICAL_OINTMENT | Freq: Two times a day (BID) | CUTANEOUS | Status: DC
  Administered 2012-04-17 – 2012-04-20 (×5): 1 via TOPICAL
  Filled 2012-04-17: qty 7

## 2012-04-17 MED ORDER — BISACODYL 10 MG RE SUPP
10.0000 mg | Freq: Every day | RECTAL | Status: DC
  Administered 2012-04-17: 10 mg via RECTAL
  Filled 2012-04-17: qty 1

## 2012-04-17 MED ORDER — ONDANSETRON 8 MG/NS 50 ML IVPB
8.0000 mg | Freq: Four times a day (QID) | INTRAVENOUS | Status: DC | PRN
  Filled 2012-04-17: qty 8

## 2012-04-17 MED ORDER — ONDANSETRON HCL 4 MG/2ML IJ SOLN: 4.0000 mg | Freq: Four times a day (QID) | INTRAMUSCULAR | Status: DC | PRN

## 2012-04-17 MED ORDER — KCL IN DEXTROSE-NACL 20-5-0.9 MEQ/L-%-% IV SOLN
INTRAVENOUS | Status: DC
  Administered 2012-04-17 – 2012-04-18 (×2): via INTRAVENOUS
  Administered 2012-04-18: 75 mL/h via INTRAVENOUS
  Administered 2012-04-19: 1000 mL via INTRAVENOUS
  Filled 2012-04-17 (×5): qty 1000

## 2012-04-17 MED ORDER — INSULIN ASPART 100 UNIT/ML ~~LOC~~ SOLN: 0.0000 [IU] | SUBCUTANEOUS | Status: DC

## 2012-04-17 MED ORDER — ACETAMINOPHEN 650 MG RE SUPP: 650.0000 mg | Freq: Four times a day (QID) | RECTAL | Status: DC | PRN

## 2012-04-17 MED ORDER — LACTATED RINGERS IV BOLUS (SEPSIS): 1000.0000 mL | Freq: Three times a day (TID) | INTRAVENOUS | Status: AC | PRN

## 2012-04-17 MED ORDER — MENTHOL 3 MG MT LOZG: 1.0000 | LOZENGE | OROMUCOSAL | Status: DC | PRN

## 2012-04-17 MED ORDER — PHENOL 1.4 % MT LIQD: 2.0000 | OROMUCOSAL | Status: DC | PRN

## 2012-04-17 MED ORDER — MAGIC MOUTHWASH
15.0000 mL | Freq: Four times a day (QID) | ORAL | Status: DC | PRN
  Filled 2012-04-17: qty 15

## 2012-04-17 MED ORDER — ONDANSETRON HCL 4 MG/2ML IJ SOLN
4.0000 mg | Freq: Once | INTRAMUSCULAR | Status: AC
  Administered 2012-04-17: 4 mg via INTRAVENOUS
  Filled 2012-04-17: qty 2

## 2012-04-17 MED ORDER — PROMETHAZINE HCL 25 MG/ML IJ SOLN: 12.5000 mg | Freq: Four times a day (QID) | INTRAMUSCULAR | Status: DC | PRN

## 2012-04-17 MED ORDER — ALUM & MAG HYDROXIDE-SIMETH 200-200-20 MG/5ML PO SUSP: 30.0000 mL | Freq: Four times a day (QID) | ORAL | Status: DC | PRN

## 2012-04-17 MED ORDER — DIPHENHYDRAMINE HCL 50 MG/ML IJ SOLN: 12.5000 mg | Freq: Four times a day (QID) | INTRAMUSCULAR | Status: DC | PRN

## 2012-04-17 MED ORDER — LORAZEPAM 2 MG/ML IJ SOLN
1.0000 mg | Freq: Once | INTRAMUSCULAR | Status: AC
  Administered 2012-04-17: 1 mg via INTRAVENOUS
  Filled 2012-04-17: qty 1

## 2012-04-17 MED ORDER — HYDROMORPHONE HCL PF 1 MG/ML IJ SOLN
0.5000 mg | INTRAMUSCULAR | Status: DC | PRN
  Administered 2012-04-18 – 2012-04-19 (×4): 1 mg via INTRAVENOUS
  Administered 2012-04-19: 2 mg via INTRAVENOUS
  Administered 2012-04-19 – 2012-04-20 (×2): 1 mg via INTRAVENOUS
  Filled 2012-04-17 (×6): qty 1
  Filled 2012-04-17: qty 2

## 2012-04-17 MED ORDER — LACTATED RINGERS IV BOLUS (SEPSIS)
1000.0000 mL | Freq: Once | INTRAVENOUS | Status: AC
  Administered 2012-04-17: 1000 mL via INTRAVENOUS

## 2012-04-17 MED ORDER — ACETAMINOPHEN 325 MG PO TABS
650.0000 mg | ORAL_TABLET | Freq: Four times a day (QID) | ORAL | Status: DC | PRN
  Administered 2012-04-19: 650 mg via ORAL
  Filled 2012-04-17: qty 2

## 2012-04-17 MED ORDER — METOPROLOL TARTRATE 1 MG/ML IV SOLN
5.0000 mg | Freq: Four times a day (QID) | INTRAVENOUS | Status: DC | PRN
  Filled 2012-04-17: qty 5

## 2012-04-17 MED ORDER — HEPARIN SODIUM (PORCINE) 5000 UNIT/ML IJ SOLN
5000.0000 [IU] | Freq: Three times a day (TID) | INTRAMUSCULAR | Status: DC
  Administered 2012-04-17 – 2012-04-20 (×8): 5000 [IU] via SUBCUTANEOUS
  Filled 2012-04-17 (×11): qty 1

## 2012-04-17 MED ORDER — LORAZEPAM 2 MG/ML IJ SOLN
1.0000 mg | Freq: Once | INTRAMUSCULAR | Status: AC
Start: 2012-04-17 — End: 2012-04-17
  Administered 2012-04-17: 1 mg via INTRAVENOUS
  Filled 2012-04-17: qty 1

## 2012-04-17 MED ORDER — LIDOCAINE HCL 2 % EX GEL
CUTANEOUS | Status: AC
  Administered 2012-04-17: 16:00:00
  Filled 2012-04-17: qty 10

## 2012-04-17 NOTE — ED Provider Notes (Addendum)
History     CSN: [id number]  Arrival date & time 04/17/12  1036   First MD Initiated Contact with Patient 04/17/12 1117      Chief Complaint  Patient presents with  . Emesis    (Consider location/radiation/quality/duration/timing/severity/associated sxs/prior treatment) Patient is a 52 y.o. male presenting with vomiting. The history is provided by the patient.  Emesis    patient here with abdominal pain and bloating with associated nonbilious vomiting x3 days. No fever or chills. Recent abdominal wall hernia surgery a week ago. No urinary symptoms. No prior history of bowel obstructions in the past. No medications taken for this. Symptoms have been persistent and nothing makes them better or worse and no treatment used prior to arrival  Past Medical History  Diagnosis Date  . Hyperlipidemia   . Chickenpox   . Obesity   . Hypertension     he also sees Dr. Shellee Milo in [place], [place]   . Asthma     weather related, no inhalers  . Sleep apnea 2003    CPAP machine, pt does not know settings  . Nephrolithiasis     2-3 kidney stones in past  . Hepatitis A     as a child, no current liver problems    Past Surgical History  Procedure Date  . Colonoscopy 07-09-09    benign polyp repeat 5 years, Dr.Sam Evette Cristal  . Tonsillectomy as child  . Uvulectomy yrs ago    for snoring  . Appendectomy  10-20yrs ago  . Hernia repair     repaired x 4  . Ventral hernia repair 04/09/2012    Procedure: LAPAROSCOPIC VENTRAL HERNIA;  Surgeon: Velora Heckler, MD;  Location: [facility];  Service: General;  Laterality: N/A;  Laparoscopic Ventral Incisional Hernia Repair with Mesh  . Insertion of mesh 04/09/2012    Procedure: INSERTION OF MESH;  Surgeon: Velora Heckler, MD;  Location: [facility];  Service: General;  Laterality: N/A;    Family History  Problem Relation Age of Onset  . Alcohol abuse    . Diabetes    . Hypertension    . Cancer      lung  . Stroke    . Obesity      History  Substance Use Topics    . Smoking status: Never Smoker   . Smokeless tobacco: Never Used  . Alcohol Use: Yes     Comment: onec week-wine      Review of Systems  Gastrointestinal: Positive for vomiting.    Allergies  Review of patient's allergies indicates no known allergies.  Home Medications   Current Outpatient Rx  Name  Route  Sig  Dispense  Refill  . AMLODIPINE BESYLATE 10 MG PO TABS   Oral   Take 10 mg by mouth every morning.         . ATORVASTATIN CALCIUM 10 MG PO TABS   Oral   Take 10 mg by mouth every morning.         . B COMPLEX-C PO TABS   Oral   Take 1 tablet by mouth daily.         Marland Kitchen CETIRIZINE HCL 10 MG PO TABS   Oral   Take 10 mg by mouth every morning.          Marland Kitchen VITAMIN D3 5000 UNITS PO TABS   Oral   Take 1 tablet by mouth at bedtime.          Marland Kitchen DHEA 25 MG  PO CAPS   Oral   Take 25 mg by mouth daily.         Marland Kitchen HYDROCODONE-ACETAMINOPHEN 5-325 MG PO TABS   Oral   Take 1 tablet by mouth every 4 (four) hours as needed. For pain.         Marland Kitchen METFORMIN HCL 500 MG PO TABS   Oral   Take 1,000 mg by mouth 2 (two) times daily with a meal.         . NIACIN 500 MG PO TABS   Oral   Take 500 mg by mouth at bedtime.          Marland Kitchen PROBIOTIC FORMULA PO   Oral   Take 1 tablet by mouth at bedtime.          Marland Kitchen PROMETHAZINE HCL 50 MG PO TABS   Oral   Take 0.5 tablets (25 mg total) by mouth every 6 (six) hours as needed for nausea.   20 tablet   1   . PSYLLIUM 0.52 G PO CAPS   Oral   Take 1.04 g by mouth 2 (two) times daily.          . ASPIRIN EC 81 MG PO TBEC   Oral   Take 81 mg by mouth daily.         . OMEGA-3 FATTY ACIDS 1000 MG PO CAPS   Oral   Take 1 g by mouth 2 (two) times daily.           BP 155/88  Pulse 111  Temp 98.2 F (36.8 C) (Oral)  Resp 18  SpO2 96%  Physical Exam  Nursing note and vitals reviewed. Constitutional: He is oriented to person, place, and time. He appears well-developed and well-nourished.  Non-toxic  appearance. No distress.  HENT:  Head: Normocephalic and atraumatic.  Eyes: Conjunctivae, EOM and lids are normal. Pupils are equal, round, and reactive to light.  Neck: Normal range of motion. Neck supple. No tracheal deviation present. No mass present.  Cardiovascular: Normal rate, regular rhythm and normal heart sounds.  Exam reveals no gallop.   No murmur heard. Pulmonary/Chest: Effort normal and breath sounds normal. No stridor. No respiratory distress. He has no decreased breath sounds. He has no wheezes. He has no rhonchi. He has no rales.  Abdominal: Soft. Normal appearance and bowel sounds are normal. He exhibits no distension. There is generalized tenderness. There is no rigidity, no rebound, no guarding and no CVA tenderness.  Musculoskeletal: Normal range of motion. He exhibits no edema and no tenderness.  Neurological: He is alert and oriented to person, place, and time. He has normal strength. No cranial nerve deficit or sensory deficit. GCS eye subscore is 4. GCS verbal subscore is 5. GCS motor subscore is 6.  Skin: Skin is warm and dry. No abrasion and no rash noted.  Psychiatric: He has a normal mood and affect. His speech is normal and behavior is normal.    ED Course  Procedures (including critical care time)   Labs Reviewed  CBC WITH DIFFERENTIAL  COMPREHENSIVE METABOLIC PANEL  LIPASE, BLOOD   No results found.   No diagnosis found.    MDM  Patient with signs of small bowel obstruction on plan films--ng tube to be placed and patient will be admitted by surgery        Toy Baker, MD 04/17/12 1259  Toy Baker, MD 05/14/12 1124

## 2012-04-17 NOTE — Telephone Encounter (Signed)
Pt called to report symptoms of chills/ fever, vomiting and reflux.  He had ventral hernia repair on 04/09/12, with office visit 04/15/12 (Dr. Gerrit Friends.)  Pt given Phenergan tabs at OV, but they are ineffective.  States he cannot lay in bed without vomiting.  Has lost of gas and belching.  Paged and updated Dr. Maisie Fus, who advises pt go to ER for work-up evaluation.  Pt agrees and will go to [facility].

## 2012-04-17 NOTE — H&P (Signed)
Pt with nausea/vomiting/BMs for many days despite phenergan. Xrays concerning for PSBO NGT with thick green bile in canister Abd obese but soft.  Moderately distended & obese.  Postop seroma - no change per pt No peritonitis Incisions closed & no cellulitis  Plan:  Admit ivf CT eval SBO vs ileus vs gastroenteritis.  R/o abscess elsewhere.  Expect some fluid around mesh c/w seroma

## 2012-04-17 NOTE — H&P (Signed)
Oscar [name] is an 52 y.o. male.   Primary care: Dr. Shellia Carwin Chief Complaint: Abdominal pain, nausea and vomiting HPI: Patient is a 52 year old gentleman who had history of an appendectomy and has had 4 ventral hernia repairs. Because of recurrence he underwent a fifth ventral hernia repair on 04/10/11 with mesh. Patient started having nausea and vomiting this weeks on 04/14/12. He was seen by Dr.Gerkin on 04/15/12. It was his opinion the patient had some GI viral symptoms. He actually became worse after he saw Dr.Gerkin and presents now to the emergency room with ongoing nausea, vomiting, no fever, some regular bowel movements and some diarrhea. Workup in the ER shows a white count of 10,700, his other labs are essentially normal. He does have a left shift on his white. Three-way abdominal and chest film shows no acute chest disease but distended small bowel loops with multiple air-fluid levels suspicious for a small bowel obstruction. He was seen in the ER an NG tube has been placed. We plan to admit the patient and obtain a CT scan. Of note he is on Glucophage for a study related to a family history of diabetes. He does not have a diagnosis of diabetes at this time.  Past Medical History  Diagnosis Date  . Hyperlipidemia   . Chickenpox   . Obesity   . Hypertension     he also sees Dr. Shellee Milo in [place], [place]   . Asthma     weather related, no inhalers  . Sleep apnea 2003    CPAP machine, pt does not know settings  . Nephrolithiasis     2-3 kidney stones in past  . Hepatitis A     as a child, no current liver problems    Past Surgical History  Procedure Date  . Colonoscopy 07-09-09    benign polyp repeat 5 years, Dr.Sam Evette Cristal  . Tonsillectomy as child  . Uvulectomy yrs ago    for snoring  . Appendectomy  10-1yrs ago  . Hernia repair     repaired x 4  . Ventral hernia repair 04/09/2012    Procedure: LAPAROSCOPIC VENTRAL HERNIA;  Surgeon: Velora Heckler, MD;  Location: [facility];   Service: General;  Laterality: N/A;  Laparoscopic Ventral Incisional Hernia Repair with Mesh  . Insertion of mesh 04/09/2012    Procedure: INSERTION OF MESH;  Surgeon: Velora Heckler, MD;  Location: [facility];  Service: General;  Laterality: N/A;    Family History  Problem Relation Age of Onset  . Alcohol abuse    . Diabetes    . Hypertension    . Cancer      lung  . Stroke    . Obesity     Social History:  reports that he has never smoked. He has never used smokeless tobacco. He reports that he drinks alcohol. He reports that he does not use illicit drugs.  Allergies: No Known Allergies Prior to Admission medications   Medication Sig Start Date End Date Taking? Authorizing Provider  amLODipine (NORVASC) 10 MG tablet Take 10 mg by mouth every morning.   Yes Historical Provider, MD  atorvastatin (LIPITOR) 10 MG tablet Take 10 mg by mouth every morning.   Yes Historical Provider, MD  B Complex-C (B-COMPLEX WITH VITAMIN C) tablet Take 1 tablet by mouth daily.   Yes Historical Provider, MD  cetirizine (ZYRTEC) 10 MG tablet Take 10 mg by mouth every morning.    Yes Historical Provider, MD  Cholecalciferol (VITAMIN D3)  5000 UNITS TABS Take 1 tablet by mouth at bedtime.    Yes Historical Provider, MD  DHEA 25 MG CAPS Take 25 mg by mouth daily.   Yes Historical Provider, MD  HYDROcodone-acetaminophen (NORCO/VICODIN) 5-325 MG per tablet Take 1 tablet by mouth every 4 (four) hours as needed. For pain. 04/10/12  Yes Historical Provider, MD  metFORMIN (GLUCOPHAGE) 500 MG tablet Take 1,000 mg by mouth 2 (two) times daily with a meal.   Yes Historical Provider, MD  niacin 500 MG tablet Take 500 mg by mouth at bedtime.    Yes Historical Provider, MD  Probiotic Product (PROBIOTIC FORMULA PO) Take 1 tablet by mouth at bedtime.    Yes Historical Provider, MD  promethazine (PHENERGAN) 50 MG tablet Take 0.5 tablets (25 mg total) by mouth every 6 (six) hours as needed for nausea. 04/15/12  Yes Velora Heckler, MD    psyllium (REGULOID) 0.52 G capsule Take 1.04 g by mouth 2 (two) times daily.    Yes Historical Provider, MD  aspirin EC 81 MG tablet Take 81 mg by mouth daily.    Historical Provider, MD  fish oil-omega-3 fatty acids 1000 MG capsule Take 1 g by mouth 2 (two) times daily.    Historical Provider, MD     (Not in a hospital admission)  Results for orders placed during the hospital encounter of 04/17/12 (from the past 48 hour(s))  CBC WITH DIFFERENTIAL     Status: Abnormal   Collection Time   04/17/12 11:20 AM      Component Value Range Comment   WBC 10.7 (*) 4.0 - 10.5 K/uL    RBC 5.79  4.22 - 5.81 MIL/uL    Hemoglobin 16.9  13.0 - 17.0 g/dL    HCT 40.9  81.1 - 91.4 %    MCV 83.9  78.0 - 100.0 fL    MCH 29.2  26.0 - 34.0 pg    MCHC 34.8  30.0 - 36.0 g/dL    RDW 78.2  95.6 - 21.3 %    Platelets 284  150 - 400 K/uL    Neutrophils Relative 79 (*) 43 - 77 %    Neutro Abs 8.5 (*) 1.7 - 7.7 K/uL    Lymphocytes Relative 9 (*) 12 - 46 %    Lymphs Abs 1.0  0.7 - 4.0 K/uL    Monocytes Relative 11  3 - 12 %    Monocytes Absolute 1.1 (*) 0.1 - 1.0 K/uL    Eosinophils Relative 1  0 - 5 %    Eosinophils Absolute 0.1  0.0 - 0.7 K/uL    Basophils Relative 0  0 - 1 %    Basophils Absolute 0.0  0.0 - 0.1 K/uL   COMPREHENSIVE METABOLIC PANEL     Status: Abnormal   Collection Time   04/17/12 11:20 AM      Component Value Range Comment   Sodium 137  135 - 145 mEq/L    Potassium 3.8  3.5 - 5.1 mEq/L    Chloride 99  96 - 112 mEq/L    CO2 26  19 - 32 mEq/L    Glucose, Bld 119 (*) 70 - 99 mg/dL    BUN 19  6 - 23 mg/dL    Creatinine, Ser 0.86  0.50 - 1.35 mg/dL    Calcium 9.4  8.4 - 57.8 mg/dL    Total Protein 7.4  6.0 - 8.3 g/dL    Albumin 3.3 (*) 3.5 - 5.2 g/dL  AST 39 (*) 0 - 37 U/L    ALT 42  0 - 53 U/L    Alkaline Phosphatase 58  39 - 117 U/L    Total Bilirubin 0.4  0.3 - 1.2 mg/dL    GFR calc non Af Amer 80 (*) >90 mL/min    GFR calc Af Amer >90  >90 mL/min   LIPASE, BLOOD     Status:  Normal   Collection Time   04/17/12 11:20 AM      Component Value Range Comment   Lipase 19  11 - 59 U/L    Dg Abd Acute W/chest  04/17/2012  *RADIOLOGY REPORT*  Clinical Data: Abdominal pain, distention, history of hernia repair  ACUTE ABDOMEN SERIES (ABDOMEN 2 VIEW & CHEST 1 VIEW)  Comparison: 04/06/2012  Findings: Cardiomediastinal silhouette is unremarkable.  No acute infiltrate or pleural effusion.  No pulmonary edema.  Distended small bowel loops are noted with multiple air-fluid levels suspicious for small bowel obstruction.  No free abdominal air.  IMPRESSION: No acute disease within chest.  Distended small bowel loops with multiple air-fluid levels suspicious for small bowel obstruction.   Original Report Authenticated By: Natasha Mead, M.D.     Review of Systems  Constitutional: Negative for fever, chills, weight loss, malaise/fatigue and diaphoresis.       Some family members have been sick, but not like this.  HENT: Negative.   Eyes: Negative.   Respiratory: Negative.   Cardiovascular: Negative.   Gastrointestinal: Positive for nausea, vomiting, abdominal pain and diarrhea (and regular BM). Negative for constipation, blood in stool and melena.  Genitourinary:       Urine is decreased and darker than usual  Musculoskeletal: Positive for back pain.       Chronic hip and back pain  Skin: Negative.   Neurological: Negative.  Negative for weakness.  Psychiatric/Behavioral: Negative.     Blood pressure 157/76, pulse 107, temperature 98.2 F (36.8 C), temperature source Oral, resp. rate 16, SpO2 93.00%. Physical Exam  Constitutional: He is oriented to person, place, and time. He appears well-developed and well-nourished.       BMI 34  HENT:  Head: Normocephalic and atraumatic.  Nose: Nose normal.       NG has been placed.  Eyes: Conjunctivae normal and EOM are normal. Pupils are equal, round, and reactive to light. Right eye exhibits no discharge. Left eye exhibits no discharge.  No scleral icterus.  Neck: Normal range of motion. Neck supple. No JVD present. No tracheal deviation present. No thyromegaly present.  Cardiovascular: Normal rate, regular rhythm, normal heart sounds and intact distal pulses.  Exam reveals no gallop.   No murmur heard. Respiratory: Effort normal. No stridor. No respiratory distress. He has no wheezes. He has rales (left base).  GI: He exhibits distension (Distended larg abdomen.). He exhibits no mass. There is tenderness (sore from distension, no specific area of pain.). There is no rebound and no guarding.       Abdominal incisions well healed.  Musculoskeletal: He exhibits no edema.  Lymphadenopathy:    He has no cervical adenopathy.  Neurological: He is alert and oriented to person, place, and time. No cranial nerve deficit.  Skin: Skin is warm and dry. No rash noted. No erythema.       Multiple tatoo's  Psychiatric: He has a normal mood and affect. His behavior is normal. Judgment and thought content normal.     Assessment/Plan 1. Postop small bowel obstruction status post recurrent  ventral hernia with repair using mesh, 04/09/12, Dr. Darnell Level. 2. Dyslipidemia 3. Hypertension 4. Sleep apnea 5. BMI of 34 6. History of nephrolithiasis (remote) 7. History of hepatitis A as a child  Plan: Patient's been seen and evaluated by Dr. Michaell Cowing, it is his opinion patient has a small bowel obstruction. We plan to place small bowel rest, NG decompression the tube has been placed. Rehydrate, a CT scan will be obtained tonight with contrast. Further workup and evaluation as needed. [facility] physician assistant for Dr. Karie Soda.  Senetra Dillin 04/17/2012, 3:13 PM

## 2012-04-17 NOTE — ED Notes (Addendum)
Pt reports had hernia repair surgery one week ago with Dr. Gerrit Friends. PT reports nausea/vomiting after surgery. Pt reports excessive burping. Pt reports severe abdominal pain. States no drainage, pus at sites. Pt saw Dr. Gerrit Friends and was tested for infection. Reports heart burn. States has lost 10 pounds in last week. Pt reports emesis is dark green in color. Given zofran with no relief.

## 2012-04-18 ENCOUNTER — Inpatient Hospital Stay (HOSPITAL_COMMUNITY): Payer: BC Managed Care – PPO

## 2012-04-18 LAB — BASIC METABOLIC PANEL
Calcium: 8.5 mg/dL (ref 8.4–10.5)
GFR calc Af Amer: 89 mL/min — ABNORMAL LOW (ref >90)
GFR calc non Af Amer: 77 mL/min — ABNORMAL LOW (ref >90)
Potassium: 3.8 mEq/L (ref 3.5–5.1)
Sodium: 140 mEq/L (ref 135–145)

## 2012-04-18 LAB — CBC
Hemoglobin: 14.9 g/dL (ref 13.0–17.0)
MCHC: 33.3 g/dL (ref 30.0–36.0)
Platelets: 319 10*3/uL (ref 150–400)
RBC: 5.21 MIL/uL (ref 4.22–5.81)

## 2012-04-18 MED ORDER — IOHEXOL 300 MG/ML  SOLN
100.0000 mL | Freq: Once | INTRAMUSCULAR | Status: AC | PRN
  Administered 2012-04-18: 100 mL via INTRAVENOUS

## 2012-04-18 MED ORDER — INSULIN ASPART 100 UNIT/ML ~~LOC~~ SOLN: 0.0000 [IU] | Freq: Four times a day (QID) | SUBCUTANEOUS | Status: DC

## 2012-04-18 NOTE — Progress Notes (Signed)
Nausea & vomiting  Subjective: Feels better with NG, having small BM's and flatus, NG with dark output  Objective: Vital signs in last 24 hours: Temp:  [97 F (36.1 C)-98.3 F (36.8 C)] 98.1 F (36.7 C) (01/18 0512) Pulse Rate:  [96-108] 96  (01/18 0512) Resp:  [16-20] 16  (01/18 0512) BP: (134-158)/(76-97) 138/84 mmHg (01/18 0512) SpO2:  [90 %-95 %] 94 % (01/18 0512) Weight:  [265 lb (120.203 kg)] 265 lb (120.203 kg) (01/17 1650) Last BM Date: 04/17/12  Intake/Output from previous day: 01/17 0701 - 01/18 0700 In: 1342.5 [I.V.:1342.5] Out: 2000 [Urine:900; Emesis/NG output:1100] Intake/Output this shift:    General appearance: alert, cooperative and no distress GI: soft, nontender, min distention  Lab Results:  Results for orders placed during the hospital encounter of 04/17/12 (from the past 24 hour(s))  CBC WITH DIFFERENTIAL     Status: Abnormal   Collection Time   04/17/12 11:20 AM      Component Value Range   WBC 10.7 (*) 4.0 - 10.5 K/uL   RBC 5.79  4.22 - 5.81 MIL/uL   Hemoglobin 16.9  13.0 - 17.0 g/dL   HCT 16.1  09.6 - 04.5 %   MCV 83.9  78.0 - 100.0 fL   MCH 29.2  26.0 - 34.0 pg   MCHC 34.8  30.0 - 36.0 g/dL   RDW 40.9  81.1 - 91.4 %   Platelets 284  150 - 400 K/uL   Neutrophils Relative 79 (*) 43 - 77 %   Neutro Abs 8.5 (*) 1.7 - 7.7 K/uL   Lymphocytes Relative 9 (*) 12 - 46 %   Lymphs Abs 1.0  0.7 - 4.0 K/uL   Monocytes Relative 11  3 - 12 %   Monocytes Absolute 1.1 (*) 0.1 - 1.0 K/uL   Eosinophils Relative 1  0 - 5 %   Eosinophils Absolute 0.1  0.0 - 0.7 K/uL   Basophils Relative 0  0 - 1 %   Basophils Absolute 0.0  0.0 - 0.1 K/uL  COMPREHENSIVE METABOLIC PANEL     Status: Abnormal   Collection Time   04/17/12 11:20 AM      Component Value Range   Sodium 137  135 - 145 mEq/L   Potassium 3.8  3.5 - 5.1 mEq/L   Chloride 99  96 - 112 mEq/L   CO2 26  19 - 32 mEq/L   Glucose, Bld 119 (*) 70 - 99 mg/dL   BUN 19  6 - 23 mg/dL   Creatinine, Ser 7.82   0.50 - 1.35 mg/dL   Calcium 9.4  8.4 - 95.6 mg/dL   Total Protein 7.4  6.0 - 8.3 g/dL   Albumin 3.3 (*) 3.5 - 5.2 g/dL   AST 39 (*) 0 - 37 U/L   ALT 42  0 - 53 U/L   Alkaline Phosphatase 58  39 - 117 U/L   Total Bilirubin 0.4  0.3 - 1.2 mg/dL   GFR calc non Af Amer 80 (*) >90 mL/min   GFR calc Af Amer >90  >90 mL/min  LIPASE, BLOOD     Status: Normal   Collection Time   04/17/12 11:20 AM      Component Value Range   Lipase 19  11 - 59 U/L  HEMOGLOBIN A1C     Status: Abnormal   Collection Time   04/17/12 11:30 AM      Component Value Range   Hemoglobin A1C 6.0 (*) <5.7 %  Mean Plasma Glucose 126 (*) <117 mg/dL  GLUCOSE, CAPILLARY     Status: Abnormal   Collection Time   04/17/12  3:46 PM      Component Value Range   Glucose-Capillary 107 (*) 70 - 99 mg/dL  GLUCOSE, CAPILLARY     Status: Normal   Collection Time   04/17/12  8:59 PM      Component Value Range   Glucose-Capillary 93  70 - 99 mg/dL  GLUCOSE, CAPILLARY     Status: Abnormal   Collection Time   04/17/12 11:31 PM      Component Value Range   Glucose-Capillary 118 (*) 70 - 99 mg/dL  GLUCOSE, CAPILLARY     Status: Abnormal   Collection Time   04/18/12  3:30 AM      Component Value Range   Glucose-Capillary 114 (*) 70 - 99 mg/dL  BASIC METABOLIC PANEL     Status: Abnormal   Collection Time   04/18/12  5:16 AM      Component Value Range   Sodium 140  135 - 145 mEq/L   Potassium 3.8  3.5 - 5.1 mEq/L   Chloride 103  96 - 112 mEq/L   CO2 28  19 - 32 mEq/L   Glucose, Bld 121 (*) 70 - 99 mg/dL   BUN 16  6 - 23 mg/dL   Creatinine, Ser 1.61  0.50 - 1.35 mg/dL   Calcium 8.5  8.4 - 09.6 mg/dL   GFR calc non Af Amer 77 (*) >90 mL/min   GFR calc Af Amer 89 (*) >90 mL/min  CBC     Status: Normal   Collection Time   04/18/12  5:16 AM      Component Value Range   WBC 7.3  4.0 - 10.5 K/uL   RBC 5.21  4.22 - 5.81 MIL/uL   Hemoglobin 14.9  13.0 - 17.0 g/dL   HCT 04.5  40.9 - 81.1 %   MCV 86.0  78.0 - 100.0 fL   MCH 28.6   26.0 - 34.0 pg   MCHC 33.3  30.0 - 36.0 g/dL   RDW 91.4  78.2 - 95.6 %   Platelets 319  150 - 400 K/uL     Studies/Results Radiology     MEDS, Scheduled    . heparin  5,000 Units Subcutaneous Q8H  . lip balm  1 application Topical BID     Assessment: Nausea & vomiting Partial SBO  Plan: CT scan with IV contrast If CT looks ok, we will clamp NG and try sips of clears   LOS: 1 day    Vanita Panda, MD [facility], [place] [phone]   04/18/2012 11:11 AM

## 2012-04-19 NOTE — Plan of Care (Signed)
Problem: Phase III Progression Outcomes Goal: Activity at appropriate level-compared to baseline (UP IN CHAIR FOR HEMODIALYSIS)  Outcome: Completed/Met Date Met:  04/19/12 Ambulate without difficulties,

## 2012-04-19 NOTE — Progress Notes (Signed)
Nausea & vomiting  Subjective: Feels better, having BM's and flatus, tolerated clears with NG clamping  Objective: Vital signs in last 24 hours: Temp:  [97.5 F (36.4 C)-98.3 F (36.8 C)] 97.5 F (36.4 C) (01/19 0532) Pulse Rate:  [92-101] 92  (01/19 0532) Resp:  [16-22] 18  (01/19 0532) BP: (136-159)/(84-99) 136/84 mmHg (01/19 0532) SpO2:  [91 %-97 %] 93 % (01/19 0532) Last BM Date: 04/18/12  Intake/Output from previous day: 01/18 0701 - 01/19 0700 In: 2292.5 [P.O.:120; I.V.:2172.5] Out: 1860 [Urine:1100; Emesis/NG output:760] Intake/Output this shift:    General appearance: alert, cooperative and no distress GI: soft, nontender, min distention  Lab Results:  No results found for this or any previous visit (from the past 24 hour(s)).   Studies/Results CT: IMPRESSION:  1. Since yesterday's plain films, improved appearance of small  bowel. Mild dilatation of proximal small bowel loops, without  focal transition to confirm residual obstruction. Favor low grade  partial obstruction versus residual adynamic ileus. No  complication.  2. Extensive colonic diverticulosis with subtle edema adjacent the  sigmoid. In the setting of multiple prior surgeries and severe  diverticulitis on 06/18/2011, this is indeterminate. Correlate  with focal left lower quadrant symptoms that would suggest  uncomplicated diverticulitis.  3. Suspect underlying adhesions within the anterior abdominal  wall. Ventral abdominal hernia repair with residual or recurrent  hernia containing fat and scar tissue.  4. Right nephrolithiasis.      MEDS, Scheduled    . heparin  5,000 Units Subcutaneous Q8H  . lip balm  1 application Topical BID     Assessment: Nausea & vomiting Partial SBO  Plan: Doing better, d/c ng, will try reg diet today Hopefully back home tom   LOS: 2 days    Vanita Panda, MD [facility], [place] [phone]   04/19/2012 8:40 AM

## 2012-04-19 NOTE — Progress Notes (Signed)
INITIAL NUTRITION ASSESSMENT  DOCUMENTATION CODES Per approved criteria  -Non-severe (moderate) malnutrition in the context of acute illness or injury -Obesity Unspecified   INTERVENTION: 1. Patient educated on consuming an easily digestible diet (low fat, low fiber) using teach back method.  2. Encourage adequate intake as tolerated.   NUTRITION DIAGNOSIS: Malnutrition related to partial SBO as evidenced by 5% weight loss over 1 month and minimal intake.   Goal: Patient will meet >/=90% of estimated nutrition needs.   Monitor:  PO intake and tolerance, weight, labs  Reason for Assessment: Malnutrition screening tool, score of 2.   52 y.o. male  Admitting Dx: Nausea & vomiting  ASSESSMENT: Patient who underwent ventral hernia repair on 04/09/12, admitted with partial SBO. NG tube d/c'd and diet advanced. Patient is tolerating diet with some complaints of mild bloating. Appetite is good. He has lost 5% of his UBW over the last month due to minimal oral intake/vomiting, which meets the criteria for non-severe malnutrition in the context of acute illness.   Height: Ht Readings from Last 1 Encounters:  04/17/12 6\' 2"  (1.88 m)    Weight: Wt Readings from Last 1 Encounters:  04/17/12 265 lb (120.203 kg)    Ideal Body Weight: 86.4 kg  % Ideal Body Weight: 139%  Wt Readings from Last 10 Encounters:  04/17/12 265 lb (120.203 kg)  04/15/12 278 lb 9.6 oz (126.372 kg)  04/09/12 283 lb (128.368 kg)  04/09/12 283 lb (128.368 kg)  04/06/12 283 lb (128.368 kg)  02/24/12 281 lb 9.6 oz (127.733 kg)  06/17/11 272 lb (123.378 kg)  05/20/11 271 lb (122.925 kg)  11/16/09 292 lb (132.45 kg)  09/18/09 293 lb (132.904 kg)    Usual Body Weight: 127 kg  % Usual Body Weight: 95%  BMI:  Body mass index is 34.02 kg/(m^2). Patient is obese.   Estimated Nutritional Needs: Kcal: 2100-2300 kcal Protein: 120-140 g Fluid: 2.6 L  Skin: Abdominal incision  Diet Order:  General  EDUCATION NEEDS: -Education needs addressed   Intake/Output Summary (Last 24 hours) at 04/19/12 1415 Last data filed at 04/19/12 1039  Gross per 24 hour  Intake 1822.5 ml  Output   1110 ml  Net  712.5 ml    Last BM: 2 within 24 hours   Labs:   Lab 04/18/12 0516 04/17/12 1120  NA 140 137  K 3.8 3.8  CL 103 99  CO2 28 26  BUN 16 19  CREATININE 1.09 1.06  CALCIUM 8.5 9.4  MG -- --  PHOS -- --  GLUCOSE 121* 119*    CBG (last 3)   Basename 04/18/12 0330 04/17/12 2331 04/17/12 2059  GLUCAP 114* 118* 93    Scheduled Meds:   . heparin  5,000 Units Subcutaneous Q8H  . lip balm  1 application Topical BID    Continuous Infusions:   Past Medical History  Diagnosis Date  . Hyperlipidemia   . Chickenpox   . Obesity   . Hypertension     he also sees Dr. Shellee Milo in [place], [place]   . Asthma     weather related, no inhalers  . Sleep apnea 2003    CPAP machine, pt does not know settings  . Nephrolithiasis     2-3 kidney stones in past  . Hepatitis A     as a child, no current liver problems    Past Surgical History  Procedure Date  . Colonoscopy 07-09-09    benign polyp repeat 5 years, Dr.Sam  Ganem  . Tonsillectomy as child  . Uvulectomy yrs ago    for snoring  . Appendectomy  10-49yrs ago  . Hernia repair     repaired x 4  . Ventral hernia repair 04/09/2012    Procedure: LAPAROSCOPIC VENTRAL HERNIA;  Surgeon: Velora Heckler, MD;  Location: [facility];  Service: General;  Laterality: N/A;  Laparoscopic Ventral Incisional Hernia Repair with Mesh  . Insertion of mesh 04/09/2012    Procedure: INSERTION OF MESH;  Surgeon: Velora Heckler, MD;  Location: [facility];  Service: General;  Laterality: N/A;    Linnell Fulling, RD, LDN Pager #: [phone] After-Hours Pager #: [phone]

## 2012-04-20 ENCOUNTER — Telehealth (INDEPENDENT_AMBULATORY_CARE_PROVIDER_SITE_OTHER): Payer: Self-pay | Admitting: General Surgery

## 2012-04-20 MED ORDER — PSYLLIUM 0.52 G PO CAPS: 1.0400 g | ORAL_CAPSULE | Freq: Two times a day (BID) | ORAL | Status: DC

## 2012-04-20 NOTE — Discharge Summary (Addendum)
  Patient ID: Oscar [name] [id number] 51 y.o. June 14, 1960  04/17/2012  Discharge date and time: 04/20/12  Admitting Physician: Vanita Panda.  Discharge Physician: Vanita Panda.  Admission Diagnoses: ileus  Discharge Diagnoses: same  Operations: None  Discharged Condition: good  Hospital Course: The patient was admitted for nausea, vomiting and inability to tolerated PO.  An NG was placed after XR revealed dilated loops of small bowel with air/fluid levels.  ~1.5L was removed.  The patient felt better.  A CT revealed no other complications.  HIs diet was advanced slowly.  He was able to be discharged home on HD4.  Consults: none  Significant Diagnostic Studies: labs: CBC, chemistries and radiology: CT scan: ileus  Treatments: NG decompression  Disposition: Home

## 2012-04-20 NOTE — Telephone Encounter (Signed)
Pt was released from hospital following ileus.  He  is still planning to go (with three other adults and two pre-schoolers) to [employer].  Pt is requesting a letter designating him to be "Temporarily Handicapped," so he can use the handicapped parking, etc, while out of town.  Please advise.

## 2012-04-20 NOTE — Progress Notes (Signed)
Pt for d/c home today. IV D/C'd. Steristrips x2 and dermabond  to abdominal incision sites intact. Surgical incisions-CDI to abdomen. Pt had BM today. Tolerates regular diet. Discomfort & soreness to 1 incision site as claimed but otherwise no problem as stated. D/C instructions given with verbalized understanding. Aware of F/U appt with MD in 2 wks.

## 2012-04-20 NOTE — Care Management Note (Signed)
    Page 1 of 1   04/20/2012     11:40:48 AM   CARE MANAGEMENT NOTE 04/20/2012  Patient:  Oscar [name], Oscar [name]   Account Number:  [id number]  Date Initiated:  04/20/2012  Documentation initiated by:  Lorenda Ishihara  Subjective/Objective Assessment:     Action/Plan:   Anticipated DC Date:  04/20/2012   Anticipated DC Plan:  HOME/SELF CARE      DC Planning Services  CM consult      Choice offered to / List presented to:             Status of service:  Completed, signed off Medicare Important Message given?   (If response is "NO", the following Medicare IM given date fields will be blank) Date Medicare IM given:   Date Additional Medicare IM given:    Discharge Disposition:  HOME/SELF CARE  Per UR Regulation:  Reviewed for med. necessity/level of care/duration of stay  If discussed at Long Length of Stay Meetings, dates discussed:    Comments:

## 2012-04-29 ENCOUNTER — Ambulatory Visit (INDEPENDENT_AMBULATORY_CARE_PROVIDER_SITE_OTHER): Payer: BC Managed Care – PPO | Admitting: Surgery

## 2012-04-29 ENCOUNTER — Encounter (INDEPENDENT_AMBULATORY_CARE_PROVIDER_SITE_OTHER): Payer: Self-pay | Admitting: Surgery

## 2012-04-29 VITALS — HR 98 | Temp 99.2°F | Resp 18 | Ht 74.0 in | Wt 267.8 lb

## 2012-04-29 DIAGNOSIS — K432 Incisional hernia without obstruction or gangrene: Secondary | ICD-10-CM

## 2012-04-29 DIAGNOSIS — M6208 Separation of muscle (nontraumatic), other site: Secondary | ICD-10-CM

## 2012-04-29 DIAGNOSIS — M6200 Separation of muscle (nontraumatic), unspecified site: Secondary | ICD-10-CM

## 2012-04-29 NOTE — Progress Notes (Signed)
General Surgery [facility]  Visit Diagnoses: 1. Rectus diastasis   2. Incisional hernia, recurrent     HISTORY: The patient returns for follow-up having undergone laparoscopic ventral incisional hernia repair with mesh. Since his last office visit he was admitted to the hospital for 4 days for suspected obstruction. CT scan showed dilated loops of small bowel without a transition point consistent with ileus. This resolved with nasogastric decompression.  Patient has had no further symptoms of obstruction.  EXAM: Abdomen is soft without distention. Surgical wounds are well healed. No sign of significant seroma. No sign of infection. With Valsalva in a standing position there is no sign of recurrence.  IMPRESSION: Status post laparoscopic ventral incisional hernia repair with mesh  PLAN: Patient will begin more vigorous physical activity. He is still restricted to light weight lifting. He may engage in aerobic activity. I have asked him to avoid any abdominal exercises at this time. He will continue to wear his binder during the day but may remove it during the evening and does not need to sleep in the binder at this time.  Patient will return for final wound check in 6 weeks.  Velora Heckler, MD, FACS General & Endocrine Surgery [facility]

## 2012-04-29 NOTE — Patient Instructions (Addendum)
  COCOA BUTTER & VITAMIN E CREAM  ([employer] or other brand)  Apply cocoa butter/vitamin E cream to your incision 2 - 3 times daily.  Massage cream into incision for one minute with each application.  Use sunscreen (50 SPF or higher) for first 6 months after surgery if area is exposed to sun.  You may substitute Mederma or other scar reducing creams as desired.   

## 2012-06-22 ENCOUNTER — Encounter (INDEPENDENT_AMBULATORY_CARE_PROVIDER_SITE_OTHER): Payer: BC Managed Care – PPO | Admitting: Surgery

## 2012-08-17 ENCOUNTER — Encounter (INDEPENDENT_AMBULATORY_CARE_PROVIDER_SITE_OTHER): Payer: BC Managed Care – PPO | Admitting: Surgery

## 2012-08-25 ENCOUNTER — Other Ambulatory Visit: Payer: Self-pay | Admitting: Family Medicine

## 2012-08-25 NOTE — Telephone Encounter (Signed)
Okay for #30 only. He needs an OV and fasting labs soon

## 2012-08-25 NOTE — Telephone Encounter (Signed)
Can we refill this? 

## 2012-08-26 NOTE — Telephone Encounter (Signed)
Can we refill this? 

## 2012-10-14 ENCOUNTER — Other Ambulatory Visit: Payer: Self-pay | Admitting: Family Medicine

## 2012-11-18 ENCOUNTER — Other Ambulatory Visit: Payer: Self-pay | Admitting: Family Medicine

## 2012-12-26 ENCOUNTER — Inpatient Hospital Stay (HOSPITAL_COMMUNITY)
Admission: EM | Admit: 2012-12-26 | Discharge: 2012-12-28 | DRG: 378 | Disposition: A | Discharge status: Home / Self Care | Payer: PRIVATE HEALTH INSURANCE | Attending: Internal Medicine | Admitting: Internal Medicine

## 2012-12-26 ENCOUNTER — Encounter (HOSPITAL_COMMUNITY): Payer: Self-pay | Admitting: Emergency Medicine

## 2012-12-26 DIAGNOSIS — I10 Essential (primary) hypertension: Secondary | ICD-10-CM | POA: Diagnosis present

## 2012-12-26 DIAGNOSIS — K5731 Diverticulosis of large intestine without perforation or abscess with bleeding: Secondary | ICD-10-CM

## 2012-12-26 DIAGNOSIS — K922 Gastrointestinal hemorrhage, unspecified: Secondary | ICD-10-CM

## 2012-12-26 DIAGNOSIS — E785 Hyperlipidemia, unspecified: Secondary | ICD-10-CM | POA: Diagnosis present

## 2012-12-26 DIAGNOSIS — J45909 Unspecified asthma, uncomplicated: Secondary | ICD-10-CM | POA: Diagnosis present

## 2012-12-26 DIAGNOSIS — D72829 Elevated white blood cell count, unspecified: Secondary | ICD-10-CM | POA: Diagnosis present

## 2012-12-26 DIAGNOSIS — Z7982 Long term (current) use of aspirin: Secondary | ICD-10-CM

## 2012-12-26 DIAGNOSIS — Z79899 Other long term (current) drug therapy: Secondary | ICD-10-CM

## 2012-12-26 DIAGNOSIS — D62 Acute posthemorrhagic anemia: Secondary | ICD-10-CM | POA: Diagnosis present

## 2012-12-26 DIAGNOSIS — Z6833 Body mass index (BMI) 33.0-33.9, adult: Secondary | ICD-10-CM

## 2012-12-26 DIAGNOSIS — G4730 Sleep apnea, unspecified: Secondary | ICD-10-CM | POA: Diagnosis present

## 2012-12-26 DIAGNOSIS — E669 Obesity, unspecified: Secondary | ICD-10-CM | POA: Diagnosis present

## 2012-12-26 DIAGNOSIS — E1169 Type 2 diabetes mellitus with other specified complication: Secondary | ICD-10-CM | POA: Diagnosis present

## 2012-12-26 HISTORY — DX: Diverticulosis of large intestine without perforation or abscess with bleeding: K57.31

## 2012-12-26 LAB — CBC
HCT: 41.8 % (ref 39.0–52.0)
Hemoglobin: 13.2 g/dL (ref 13.0–17.0)
MCH: 29.5 pg (ref 26.0–34.0)
MCHC: 34.4 g/dL (ref 30.0–36.0)
MCHC: 33.8 g/dL (ref 30.0–36.0)
MCV: 86.4 fL (ref 78.0–100.0)
Platelets: 234 10*3/uL (ref 150–400)
Platelets: 239 10*3/uL (ref 150–400)
RBC: 4.84 MIL/uL (ref 4.22–5.81)
RBC: 4.48 MIL/uL (ref 4.22–5.81)
RDW: 14.3 % (ref 11.5–15.5)

## 2012-12-26 LAB — CBC WITH DIFFERENTIAL/PLATELET
Basophils Absolute: 0 10*3/uL (ref 0.0–0.1)
Basophils Relative: 0 % (ref 0–1)
HCT: 45.7 % (ref 39.0–52.0)
Hemoglobin: 15.7 g/dL (ref 13.0–17.0)
Lymphocytes Relative: 12 % (ref 12–46)
Monocytes Absolute: 1 10*3/uL (ref 0.1–1.0)
Monocytes Relative: 11 % (ref 3–12)
Neutro Abs: 7 10*3/uL (ref 1.7–7.7)
Neutrophils Relative %: 75 % (ref 43–77)
WBC: 9.3 10*3/uL (ref 4.0–10.5)

## 2012-12-26 LAB — BASIC METABOLIC PANEL
BUN: 19 mg/dL (ref 6–23)
CO2: 26 mEq/L (ref 19–32)
Chloride: 101 mEq/L (ref 96–112)
Creatinine, Ser: 1.26 mg/dL (ref 0.50–1.35)
GFR calc Af Amer: 74 mL/min — ABNORMAL LOW (ref >90)
Potassium: 3.5 mEq/L (ref 3.5–5.1)

## 2012-12-26 LAB — GLUCOSE, CAPILLARY: Glucose-Capillary: 116 mg/dL — ABNORMAL HIGH (ref 70–99)

## 2012-12-26 LAB — HEMOGLOBIN
Hemoglobin: 13.1 g/dL (ref 13.0–17.0)
Hemoglobin: 11.4 g/dL — ABNORMAL LOW (ref 13.0–17.0)

## 2012-12-26 LAB — ABO/RH: ABO/RH(D): O POS

## 2012-12-26 LAB — PROTIME-INR: Prothrombin Time: 13.2 seconds (ref 11.6–15.2)

## 2012-12-26 LAB — SAMPLE TO BLOOD BANK

## 2012-12-26 LAB — TYPE AND SCREEN: ABO/RH(D): O POS

## 2012-12-26 LAB — HEMATOCRIT: HCT: 38.2 % — ABNORMAL LOW (ref 39.0–52.0)

## 2012-12-26 MED ORDER — DHEA 25 MG PO CAPS: 25.0000 mg | ORAL_CAPSULE | Freq: Every morning | ORAL | Status: DC

## 2012-12-26 MED ORDER — SODIUM CHLORIDE 0.9 % IJ SOLN
3.0000 mL | INTRAMUSCULAR | Status: DC | PRN
  Administered 2012-12-27: 3 mL via INTRAVENOUS

## 2012-12-26 MED ORDER — COENZYME Q10 30 MG PO CAPS: 30.0000 mg | ORAL_CAPSULE | Freq: Every morning | ORAL | Status: DC

## 2012-12-26 MED ORDER — NIACIN 500 MG PO TABS
500.0000 mg | ORAL_TABLET | Freq: Every day | ORAL | Status: DC
  Filled 2012-12-26 (×3): qty 1

## 2012-12-26 MED ORDER — ONDANSETRON HCL 4 MG/2ML IJ SOLN: 4.0000 mg | Freq: Four times a day (QID) | INTRAMUSCULAR | Status: DC | PRN

## 2012-12-26 MED ORDER — DEXTROSE-NACL 5-0.9 % IV SOLN
INTRAVENOUS | Status: DC
  Administered 2012-12-26: 13:00:00 via INTRAVENOUS

## 2012-12-26 MED ORDER — ATORVASTATIN CALCIUM 10 MG PO TABS
10.0000 mg | ORAL_TABLET | Freq: Every morning | ORAL | Status: DC
  Administered 2012-12-26 – 2012-12-27 (×2): 10 mg via ORAL
  Filled 2012-12-26 (×3): qty 1

## 2012-12-26 MED ORDER — SODIUM CHLORIDE 0.9 % IV SOLN: 250.0000 mL | INTRAVENOUS | Status: DC | PRN

## 2012-12-26 MED ORDER — ONDANSETRON HCL 4 MG PO TABS: 4.0000 mg | ORAL_TABLET | Freq: Four times a day (QID) | ORAL | Status: DC | PRN

## 2012-12-26 MED ORDER — METFORMIN HCL 500 MG PO TABS
500.0000 mg | ORAL_TABLET | Freq: Two times a day (BID) | ORAL | Status: DC
  Administered 2012-12-26: 500 mg via ORAL
  Filled 2012-12-26 (×4): qty 1

## 2012-12-26 MED ORDER — ACETAMINOPHEN 325 MG PO TABS: 650.0000 mg | ORAL_TABLET | Freq: Four times a day (QID) | ORAL | Status: DC | PRN

## 2012-12-26 MED ORDER — SODIUM CHLORIDE 0.9 % IJ SOLN
3.0000 mL | Freq: Two times a day (BID) | INTRAMUSCULAR | Status: DC
  Administered 2012-12-26 – 2012-12-27 (×2): 3 mL via INTRAVENOUS

## 2012-12-26 MED ORDER — VITAMIN D3 25 MCG (1000 UNIT) PO TABS
5000.0000 [IU] | ORAL_TABLET | Freq: Every evening | ORAL | Status: DC
  Administered 2012-12-26: 5000 [IU] via ORAL
  Filled 2012-12-26 (×3): qty 5

## 2012-12-26 MED ORDER — B COMPLEX-C PO TABS
1.0000 | ORAL_TABLET | Freq: Every morning | ORAL | Status: DC
  Filled 2012-12-26 (×3): qty 1

## 2012-12-26 MED ORDER — ACETAMINOPHEN 650 MG RE SUPP: 650.0000 mg | Freq: Four times a day (QID) | RECTAL | Status: DC | PRN

## 2012-12-26 NOTE — Progress Notes (Signed)
Pt up to BR. Another 200 ml bloody stool noted. In commode. Pt reported "first time I felt a little lightheaded." Advised to call for assist when ambulating. Last HGB 13.1.

## 2012-12-26 NOTE — ED Provider Notes (Signed)
CSN: [id number]     Arrival date & time 12/26/12  0406 History   First MD Initiated Contact with Patient 12/26/12 0423     Chief Complaint  Patient presents with  . Rectal Bleeding   (Consider location/radiation/quality/duration/timing/severity/associated sxs/prior Treatment) HPI 52 year old male presents to emergency room with complaint of rectal bleeding.  Patient reports tonight after going out to a movie, he had large bloody bowel movement mixed with stool.  Since that time.  He has had 6-8 further bloody bowel movements.  He denies any abdominal pain, no nausea.  Patient has history of diverticulitis, hemorrhoids.  No rectal masses or pain.  No prior history of similar symptoms.  Patient contacted his GI Dr., who recommend following up in the morning.  Patient woke just prior to arrival, and found he was lying in a pool of blood.  Patient was lightheaded and clammy, which prompted him to come to the emergency department.  Past Medical History  Diagnosis Date  . Hyperlipidemia   . Chickenpox   . Obesity   . Hypertension     he also sees Dr. Shellee Milo in [place], [place]   . Asthma     weather related, no inhalers  . Sleep apnea 2003    CPAP machine, pt does not know settings  . Nephrolithiasis     2-3 kidney stones in past  . Hepatitis A     as a child, no current liver problems   Past Surgical History  Procedure Laterality Date  . Colonoscopy  07-09-09    benign polyp repeat 5 years, Dr.Sam Evette Cristal  . Tonsillectomy  as child  . Uvulectomy  yrs ago    for snoring  . Appendectomy   10-44yrs ago  . Hernia repair      repaired x 4  . Ventral hernia repair  04/09/2012    Procedure: LAPAROSCOPIC VENTRAL HERNIA;  Surgeon: Velora Heckler, MD;  Location: [facility];  Service: General;  Laterality: N/A;  Laparoscopic Ventral Incisional Hernia Repair with Mesh  . Insertion of mesh  04/09/2012    Procedure: INSERTION OF MESH;  Surgeon: Velora Heckler, MD;  Location: [facility];  Service: General;   Laterality: N/A;   Family History  Problem Relation Age of Onset  . Alcohol abuse    . Diabetes    . Hypertension    . Cancer      lung  . Stroke    . Obesity     History  Substance Use Topics  . Smoking status: Never Smoker   . Smokeless tobacco: Never Used  . Alcohol Use: Yes     Comment: onec week-wine    Review of Systems  All other systems reviewed and are negative.    Allergies  Review of patient's allergies indicates no known allergies.  Home Medications   Current Outpatient Rx  Name  Route  Sig  Dispense  Refill  . aspirin EC 81 MG tablet   Oral   Take 81 mg by mouth every evening.          Marland Kitchen atorvastatin (LIPITOR) 10 MG tablet   Oral   Take 10 mg by mouth every morning.         . B Complex-C (B-COMPLEX WITH VITAMIN C) tablet   Oral   Take 1 tablet by mouth every morning.          . cholecalciferol (VITAMIN D) 1000 UNITS tablet   Oral   Take 5,000 Units by mouth  every evening.         Marland Kitchen DHEA 25 MG CAPS   Oral   Take 25 mg by mouth every morning.          . fish oil-omega-3 fatty acids 1000 MG capsule   Oral   Take 1 g by mouth 2 (two) times daily.         . hydrochlorothiazide (HYDRODIURIL) 25 MG tablet   Oral   Take 25 mg by mouth every morning.         . metFORMIN (GLUCOPHAGE) 500 MG tablet   Oral   Take 500 mg by mouth 2 (two) times daily with a meal.          . niacin 500 MG tablet   Oral   Take 500 mg by mouth at bedtime.          . Probiotic Product (PROBIOTIC FORMULA PO)   Oral   Take 1 tablet by mouth at bedtime.           BP 151/96  Pulse 103  Temp(Src) 97.5 F (36.4 C) (Oral)  Resp 18  Ht 6\' 2"  (1.88 m)  Wt 258 lb (117.028 kg)  BMI 33.11 kg/m2  SpO2 93% Physical Exam  Nursing note and vitals reviewed. Constitutional: He is oriented to person, place, and time. He appears well-developed and well-nourished.  Orthostatic vitals reviewed, patient is orthostatic with standing  HENT:  Head:  Normocephalic and atraumatic.  Nose: Nose normal.  Mouth/Throat: Oropharynx is clear and moist.  Eyes: Conjunctivae and EOM are normal. Pupils are equal, round, and reactive to light.  Neck: Normal range of motion. Neck supple. No JVD present. No tracheal deviation present. No thyromegaly present.  Cardiovascular: Normal rate, regular rhythm, normal heart sounds and intact distal pulses.  Exam reveals no gallop and no friction rub.   No murmur heard. Pulmonary/Chest: Effort normal and breath sounds normal. No stridor. No respiratory distress. He has no wheezes. He has no rales. He exhibits no tenderness.  Abdominal: Soft. Bowel sounds are normal. He exhibits no distension and no mass. There is no tenderness. There is no rebound and no guarding.  Musculoskeletal: Normal range of motion. He exhibits no edema and no tenderness.  Lymphadenopathy:    He has no cervical adenopathy.  Neurological: He is alert and oriented to person, place, and time. He exhibits normal muscle tone. Coordination normal.  Skin: Skin is warm and dry. No rash noted. No erythema. There is pallor.  Psychiatric: He has a normal mood and affect. His behavior is normal. Judgment and thought content normal.    ED Course  Procedures (including critical care time) Labs Review Labs Reviewed  BASIC METABOLIC PANEL - Abnormal; Notable for the following:    Glucose, Bld 146 (*)    GFR calc non Af Amer 64 (*)    GFR calc Af Amer 74 (*)    All other components within normal limits  CBC WITH DIFFERENTIAL  PROTIME-INR  SAMPLE TO BLOOD BANK   Imaging Review No results found.  MDM   1. Acute lower GI bleeding    52 year old male with GI bleeding, probable diverticular in origin.  He is orthostatic.  We'll plan to get labs, give IV fluids.  5:33 AM H/h are stable.  Pt has had another episode of BRBPR.  Pt is not wanting to stay for evalaution as his daughter has a soccer game.  D/w Dr Ewing Schlein, on call for Dr Evette Cristal.  He  recommends monitoring in the ED until about an hour prior to the game.  If he continues to have bloody bm, he will need admission.  If bleeding slows/stops, he will need to stick to a clear liquid diet and f/u with Dr Evette Cristal on Monday.  6:27 AM Patient, reports he's had 3 further large bloody bowel movements, no stool.  He is now agreeable to admission.  Will discuss with hospitalist for admission, and a GI consult.  Olivia Mackie, MD 12/26/12 [phone]

## 2012-12-26 NOTE — Progress Notes (Signed)
Oscar [name] 11:27 AM  Subjective: Patient seen and examined and his history was reviewed and we had a long talk about diverticular bleeding and the usual clinical course and possible tests and his concerns were discussed and we answered all of his questions  Objective: Vital signs stable afebrile no acute distress abdomen is soft nontender he did show me on his stone his baseline hemoglobin of 17 recent check down to 14.4 which we discussed  Assessment: Diverticular bleeding  Plan: Continue customary diverticular bleeding plans please see consult note for details and have instructed the nurse to call me when necessary  [facility] E

## 2012-12-26 NOTE — H&P (Signed)
[facility] History and Physical  Oscar [name] WUJ:[id number] DOB: 06-30-1960 DOA: 12/26/2012  Referring physician: Dr. Norlene Campbell PCP: Nelwyn Salisbury, MD   Chief Complaint: Rectal bleeding   History of Present Illness: Oscar [name] is an 52 y.o. male with a PMH of diverticulosis and aspirin related GI bleeding who presents with a 12 hour history of persistent painless bright red rectal bleeding x 10 episodes.  The patient also had some dizziness and diaphoresis last p.m.  he typically takes daily aspirin, but did not take his dose last night secondary to the onset of the rectal bleeding. He is up-to-date with regard to his colonoscopies and reports that he gets these every 5 years and has known diverticulosis. Initially requested to be discharged home, but opted to be admitted for observation after having 4 bloody stools in the emergency department. Patient states that he estimates that each stool had about one quarter-1/2 cup of blood. Upon initial evaluation emergency department, the patient was found to have a hemoglobin of 15.7 mg/dL and was initially hypertensive, although his blood pressure has trended down over the course of his hospital stay thus far.  Review of Systems: Constitutional: No fever, no chills;  Appetite normal; No weight loss, no weight gain, no fatigue.  HEENT: No blurry vision, no diplopia, no pharyngitis, no dysphagia CV: No chest pain, + occasional palpitations.  Resp: No SOB, no cough. GI: No nausea, no vomiting, + diarrhea, no melena, + hematochezia.  GU: No dysuria, no hematuria. MSK: no myalgias, no arthralgias.  Neuro:  No headache, no focal neurological deficits, no history of seizures.  Psych: No depression, no anxiety, +stress related insomnia.  Endo: No heat intolerance, no cold intolerance, no polyuria, no polydipsia  Skin: No rashes, no skin lesions.  Heme: No easy bruising.  Past Medical History Past Medical History  Diagnosis Date  . Hyperlipidemia   .  Chickenpox   . Obesity   . Hypertension     he also sees Dr. Shellee [name] in [place], [place]   . Asthma     weather related, no inhalers  . Sleep apnea 2003    CPAP machine, pt does not know settings  . Nephrolithiasis     2-3 kidney stones in past  . Hepatitis A     as a child, no current liver problems     Past Surgical History Past Surgical History  Procedure Laterality Date  . Colonoscopy  07-09-09    benign polyp repeat 5 years, Dr.Sam Evette Cristal  . Tonsillectomy  as child  . Uvulectomy  yrs ago    for snoring  . Appendectomy   10-69yrs ago  . Hernia repair      repaired x 4  . Ventral hernia repair  04/09/2012    Procedure: LAPAROSCOPIC VENTRAL HERNIA;  Surgeon: Velora Heckler, MD;  Location: [facility];  Service: General;  Laterality: N/A;  Laparoscopic Ventral Incisional Hernia Repair with Mesh  . Insertion of mesh  04/09/2012    Procedure: INSERTION OF MESH;  Surgeon: Velora Heckler, MD;  Location: [facility];  Service: General;  Laterality: N/A;     Social History: History   Social History  . Marital Status: Married    Spouse Name: Oscar [name]    Number of Children: 4  . Years of Education: 13   Occupational History  . 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 Smoking status: Never Smoker   . Smokeless tobacco: Never Used  .  Alcohol Use: Yes     Comment: Once a week-wine  . Drug Use: No  . Sexual Activity: No   Other Topics Concern  . Not on file   Social History Narrative   Married.  Lives with wife.  Independent of ADLs.    Family History:  Family History  Problem Relation Age of Onset  . Diabetes Brother   . Hypertension Brother   . Stroke Mother   . Obesity Brother     Allergies: Review of patient's allergies indicates no known allergies.  Meds: Prior to Admission medications   Medication Sig Start Date End Date Taking? Authorizing Provider  aspirin EC 81 MG tablet Take 81 mg by mouth every evening.    Yes Historical Provider, MD   atorvastatin (LIPITOR) 10 MG tablet Take 10 mg by mouth every morning.   Yes Historical Provider, MD  B Complex-C (B-COMPLEX WITH VITAMIN C) tablet Take 1 tablet by mouth every morning.    Yes Historical Provider, MD  cholecalciferol (VITAMIN D) 1000 UNITS tablet Take 5,000 Units by mouth every evening.   Yes Historical Provider, MD  co-enzyme Q-10 30 MG capsule Take 30 mg by mouth every morning.   Yes Historical Provider, MD  DHEA 25 MG CAPS Take 25 mg by mouth every morning.    Yes Historical Provider, MD  fish oil-omega-3 fatty acids 1000 MG capsule Take 1 g by mouth 2 (two) times daily.   Yes Historical Provider, MD  hydrochlorothiazide (HYDRODIURIL) 25 MG tablet Take 25 mg by mouth every morning.   Yes Historical Provider, MD  metFORMIN (GLUCOPHAGE) 500 MG tablet Take 500 mg by mouth 2 (two) times daily with a meal.    Yes Historical Provider, MD  niacin 500 MG tablet Take 500 mg by mouth at bedtime.    Yes Historical Provider, MD  OVER THE COUNTER MEDICATION Take 1 tablet by mouth every morning. OTC supplement "Inflammatone"   Yes Historical Provider, MD  Probiotic Product (PROBIOTIC FORMULA PO) Take 1 tablet by mouth at bedtime.    Yes Historical Provider, MD    Physical Exam: Filed Vitals:   12/26/12 0414 12/26/12 0443 12/26/12 0448 12/26/12 0451  BP: 151/96 131/85 120/63 119/68  Pulse: 103 98 102 105  Temp: 97.5 F (36.4 C)     TempSrc: Oral     Resp: 18     Height: 6\' 2"  (1.88 m)     Weight: 117.028 kg (258 lb)     SpO2: 93%        Physical Exam: Blood pressure 119/68, pulse 105, temperature 97.5 F (36.4 C), temperature source Oral, resp. rate 18, height 6\' 2"  (1.88 m), weight 117.028 kg (258 lb), SpO2 93.00%. Gen: No acute distress. Head: Normocephalic, atraumatic. Eyes: PERRL, EOMI, sclerae nonicteric. Mouth: Oropharynx clear with moist mucous membranes. Neck: Supple, no thyromegaly, no lymphadenopathy, no jugular venous distention. Chest: Lungs clear to  auscultation bilaterally. CV: Heart sounds are tachycardic, regular, without murmurs, rubs, or gallops. Abdomen: Soft, nontender, nondistended with normal active bowel sounds. Extremities: Extremities are without clubbing, edema, or cyanosis. Skin: Warm and dry. Multiple tattoos. Neuro: Alert and oriented times 3; cranial nerves II through XII grossly intact. Psych: Mood and affect normal.  Labs on Admission:  Basic Metabolic Panel:  Recent Labs Lab 12/26/12 0440  NA 138  K 3.5  CL 101  CO2 26  GLUCOSE 146*  BUN 19  CREATININE 1.26  CALCIUM 8.6   CBC:  Recent Labs Lab 12/26/12 0440  WBC 9.3  NEUTROABS 7.0  HGB 15.7  HCT 45.7  MCV 86.7  PLT 223    Radiological Exams on Admission: No results found.  Assessment/Plan Principal Problem:   Diverticular hemorrhage -Admit for observation. Check H. and H. every 6 hours. Place 2 large-bore IVs. Type and screen.  -GI consultation requested by ED physician.  -Hold fish oil and aspirin.  Active Problems:   HYPERLIPIDEMIA -Continue statin but hold fish oil.    HYPERTENSION -Hold antihypertensives.    SLEEP APNEA -CPAP each bedtime.   Code Status: Full. Family Communication: No family at bedside. Disposition Plan: Home when stable.  Time spent: 1 hour.  Elis Rawlinson [facility] Pager [phone]  If 7PM-7AM, please contact night-coverage [email or web address] Password [email or web address] 12/26/2012, 7:52 AM

## 2012-12-26 NOTE — ED Notes (Signed)
Pt c/o bright red rectal bleeding x 7-8 x since 2300 last night. Pt states he has become lightheaded and clammy.

## 2012-12-26 NOTE — Progress Notes (Signed)
PHARMACIST - PHYSICIAN ORDER COMMUNICATION  CONCERNING: P&T Medication Policy on Herbal Medications  DESCRIPTION:  This patient's order for:  Coenzyme Q, DHEA  has been noted.  This product(s) is classified as an "herbal" or natural product. Due to a lack of definitive safety studies or FDA approval, nonstandard manufacturing practices, plus the potential risk of unknown drug-drug interactions while on inpatient medications, the Pharmacy and Therapeutics Committee does not permit the use of "herbal" or natural products of this type within [facility].   ACTION TAKEN: The pharmacy department is unable to verify this order at this time and your patient has been informed of this safety policy. Please reevaluate patient's clinical condition at discharge and address if the herbal or natural product(s) should be resumed at that time.  Thank you, Loralee Pacas, PharmD, BCPS 12/26/2012 9:32 AM Pharmacy [phone]

## 2012-12-26 NOTE — ED Notes (Signed)
Pt's bed assignment changed to med/surg

## 2012-12-26 NOTE — Consult Note (Signed)
Reason for Consult: Lower GI bleeding Referring Physician: Hospital team  Oscar [name] is an 52 y.o. male.  HPI: Patient called me a few times last night with acute onset of bright red blood per rectal known history of diverticulosis and he was not having any pain but is on an aspirin a day and when the bleeding continued he was instructed to go to the emergency room and the hospital chart in our office chart was reviewed and is had 3 colonoscopies 2005 2010 in the last one in October of 2013 and all had sigmoid and descending diverticuli only with an adenomatous polyp on the first one and 1 hyperplastic  on the second and no polyps on the third and his case was then discussed with the ER physician and he was admitted for observation and further workup and plans  Past Medical History  Diagnosis Date  . Hyperlipidemia   . Chickenpox   . Obesity   . Asthma     weather related, no inhalers  . Sleep apnea 2003    CPAP machine, pt does not know settings  . Nephrolithiasis     2-3 kidney stones in past  . Hepatitis A     as a child, no current liver problems  . Hypertension     he also sees Dr. Gwenette Greet in [place], [place]     Past Surgical History  Procedure Laterality Date  . Colonoscopy  07-09-09    benign polyp repeat 5 years, Dr.Sam Evette Cristal  . Tonsillectomy  as child  . Uvulectomy  yrs ago    for snoring  . Appendectomy   10-71yrs ago  . Hernia repair      repaired x 4  . Ventral hernia repair  04/09/2012    Procedure: LAPAROSCOPIC VENTRAL HERNIA;  Surgeon: Velora Heckler, MD;  Location: [facility];  Service: General;  Laterality: N/A;  Laparoscopic Ventral Incisional Hernia Repair with Mesh  . Insertion of mesh  04/09/2012    Procedure: INSERTION OF MESH;  Surgeon: Velora Heckler, MD;  Location: [facility];  Service: General;  Laterality: N/A;    Family History  Problem Relation Age of Onset  . Diabetes Brother   . Hypertension Brother   . Stroke Mother   . Obesity Brother     Social  History:  reports that he has never smoked. He has never used smokeless tobacco. He reports that  drinks alcohol. He reports that he does not use illicit drugs.  Allergies: No Known Allergies  Medications: I have reviewed the patient's current medications.  Results for orders placed during the hospital encounter of 12/26/12 (from the past 48 hour(s))  SAMPLE TO BLOOD BANK     Status: None   Collection Time    12/26/12  4:38 AM      Result Value Range   Blood Bank Specimen SAMPLE AVAILABLE FOR TESTING     Sample Expiration 12/29/2012    CBC WITH DIFFERENTIAL     Status: None   Collection Time    12/26/12  4:40 AM      Result Value Range   WBC 9.3  4.0 - 10.5 K/uL   RBC 5.27  4.22 - 5.81 MIL/uL   Hemoglobin 15.7  13.0 - 17.0 g/dL   HCT 16.1  09.6 - 04.5 %   MCV 86.7  78.0 - 100.0 fL   MCH 29.8  26.0 - 34.0 pg   MCHC 34.4  30.0 - 36.0 g/dL   RDW 14.3  11.5 - 15.5 %   Platelets 223  150 - 400 K/uL   Neutrophils Relative % 75  43 - 77 %   Neutro Abs 7.0  1.7 - 7.7 K/uL   Lymphocytes Relative 12  12 - 46 %   Lymphs Abs 1.2  0.7 - 4.0 K/uL   Monocytes Relative 11  3 - 12 %   Monocytes Absolute 1.0  0.1 - 1.0 K/uL   Eosinophils Relative 2  0 - 5 %   Eosinophils Absolute 0.1  0.0 - 0.7 K/uL   Basophils Relative 0  0 - 1 %   Basophils Absolute 0.0  0.0 - 0.1 K/uL  BASIC METABOLIC PANEL     Status: Abnormal   Collection Time    12/26/12  4:40 AM      Result Value Range   Sodium 138  135 - 145 mEq/L   Potassium 3.5  3.5 - 5.1 mEq/L   Chloride 101  96 - 112 mEq/L   CO2 26  19 - 32 mEq/L   Glucose, Bld 146 (*) 70 - 99 mg/dL   BUN 19  6 - 23 mg/dL   Creatinine, Ser 9.60  0.50 - 1.35 mg/dL   Calcium 8.6  8.4 - 45.4 mg/dL   GFR calc non Af Amer 64 (*) >90 mL/min   GFR calc Af Amer 74 (*) >90 mL/min   Comment: (NOTE)     The eGFR has been calculated using the CKD EPI equation.     This calculation has not been validated in all clinical situations.     eGFR's persistently <90 mL/min  signify possible Chronic Kidney     Disease.  PROTIME-INR     Status: None   Collection Time    12/26/12  4:40 AM      Result Value Range   Prothrombin Time 13.2  11.6 - 15.2 seconds   INR 1.02  0.00 - 1.49    No results found.  ROS negative except above Blood pressure 129/85, pulse 107, temperature 98.2 F (36.8 C), temperature source Oral, resp. rate 18, height 6\' 2"  (1.88 m), weight 117.028 kg (258 lb), SpO2 97.00%. Physical Exam patient will be seen and examined  Assessment/Plan: Probable diverticular bleeding Plan: Clear liquid follow H&H consider nuclear bleeding scan if bleeding continues and possible interventional radiology if positive and might even consider a repeat flexible sigmoidoscopy if needed and transfuse when necessary and hold aspirin for now and reevaluate needs as an outpatient  [facility] 12/26/2012, 10:37 AM

## 2012-12-26 NOTE — Progress Notes (Signed)
Pt is refusing to wear CPAP tonight. RT made pt aware that if he changed his mind at all to please call.

## 2012-12-26 NOTE — Plan of Care (Signed)
Problem: Phase I Progression Outcomes Goal: Pain controlled with appropriate interventions Outcome: Completed/Met Date Met:  12/26/12 No complaints of pain or discomfort.

## 2012-12-26 NOTE — Progress Notes (Signed)
Dr. Darnelle Catalan aware via phone of pt's concern regarding home blood pressure pill not being ordered. MD aware of resent vital signs. No new orders received. MD asked nurse to let pt know based on Dx, blood pressure meds would not be resumed at this time. Pt aware with reassurance given.

## 2012-12-27 DIAGNOSIS — K5731 Diverticulosis of large intestine without perforation or abscess with bleeding: Principal | ICD-10-CM

## 2012-12-27 DIAGNOSIS — I10 Essential (primary) hypertension: Secondary | ICD-10-CM

## 2012-12-27 LAB — CBC
HCT: 31.7 % — ABNORMAL LOW (ref 39.0–52.0)
HCT: 29.9 % — ABNORMAL LOW (ref 39.0–52.0)
Hemoglobin: 10.3 g/dL — ABNORMAL LOW (ref 13.0–17.0)
MCHC: 34.1 g/dL (ref 30.0–36.0)
Platelets: 202 10*3/uL (ref 150–400)
Platelets: 202 10*3/uL (ref 150–400)
RBC: 3.66 MIL/uL — ABNORMAL LOW (ref 4.22–5.81)
RBC: 3.49 MIL/uL — ABNORMAL LOW (ref 4.22–5.81)
RDW: 14.3 % (ref 11.5–15.5)
WBC: 11.8 10*3/uL — ABNORMAL HIGH (ref 4.0–10.5)
WBC: 10.6 10*3/uL — ABNORMAL HIGH (ref 4.0–10.5)

## 2012-12-27 NOTE — Progress Notes (Signed)
[facility] PROGRESS NOTE  Oscar [name] ZOX:[id number] DOB: [birth year] DOA: 12/26/2012 PCP: Nelwyn Salisbury, MD  Assessment/Plan: 1. Diverticular bleeding -appears to have subsided since 2am today -Gi Dr.Magod following -if profuse bleeding recurs will need Bleeding scan vs Flex sig -monitor CBC q8  2. Acute blood loss anemia -transfuse for Hb<7 or symptoms  3. HTN -stable, monitor off HCTZ for now  4. Leukocytosis -likely reactive -monitor  DVT proph: SCDs   Code Status: Full Family Communication:none at bedside Disposition Plan: home when stable   Consultants:  Gi Dr.Magod  HPI/Subjective: No further bleeding since 2am  Objective: Filed Vitals:   12/27/12 1457  BP: 141/92  Pulse: 107  Temp: 98 F (36.7 C)  Resp: 18    Intake/Output Summary (Last 24 hours) at 12/27/12 1513 Last data filed at 12/27/12 1457  Gross per 24 hour  Intake    986 ml  Output   2750 ml  Net  -1764 ml   Filed Weights   12/26/12 0414  Weight: 117.028 kg (258 lb)    Exam:   General:  AAOx3, no distress  Cardiovascular: S1S2/RRR  Respiratory: CTAB  Abdomen: soft, NT, ND, BS present Musculoskeletal: no edema c/c  Data Reviewed: Basic Metabolic Panel:  Recent Labs Lab 12/26/12 0440  NA 138  K 3.5  CL 101  CO2 26  GLUCOSE 146*  BUN 19  CREATININE 1.26  CALCIUM 8.6   Liver Function Tests: No results found for this basename: AST, ALT, ALKPHOS, BILITOT, PROT, ALBUMIN,  in the last 168 hours No results found for this basename: LIPASE, AMYLASE,  in the last 168 hours No results found for this basename: AMMONIA,  in the last 168 hours CBC:  Recent Labs Lab 12/26/12 0440 12/26/12 1026 12/26/12 1529 12/26/12 1534 12/26/12 2114 12/27/12 0320 12/27/12 0826  WBC 9.3 9.3  --  10.2  --  11.8* 10.6*  NEUTROABS 7.0  --   --   --   --   --   --   HGB 15.7 14.4 13.1 13.2 11.4* 10.8* 10.3*  HCT 45.7 41.8 38.2* 39.1 33.7* 31.7* 29.9*  MCV 86.7 86.4  --  87.3   --  86.6 85.7  PLT 223 234  --  239  --  202 202   Cardiac Enzymes: No results found for this basename: CKTOTAL, CKMB, CKMBINDEX, TROPONINI,  in the last 168 hours BNP (last 3 results) No results found for this basename: PROBNP,  in the last 8760 hours CBG:  Recent Labs Lab 12/26/12 2133  GLUCAP 116*    No results found for this or any previous visit (from the past 240 hour(s)).   Studies: No results found.  Scheduled Meds: . atorvastatin  10 mg Oral q morning - 10a  . B-complex with vitamin C  1 tablet Oral q morning - 10a  . cholecalciferol  5,000 Units Oral QPM  . niacin  500 mg Oral QHS  . sodium chloride  3 mL Intravenous Q12H   Continuous Infusions:   Principal Problem:   Diverticular hemorrhage Active Problems:   HYPERLIPIDEMIA   HYPERTENSION   SLEEP APNEA    Time spent:    [facility]  [facility] Pager [phone]. If 7PM-7AM, please contact night-coverage at [email or web address], password [facility] 12/27/2012, 3:13 PM  LOS: 1 day

## 2012-12-27 NOTE — Progress Notes (Signed)
Dr. Dulce Sellar aware via phone lab called and questioned necessity to stick pt at this hour per CBC q8h order that was still in [facility] ordered by Dr. Ewing Schlein yesterday. Lab tech noted that medical MD had discontinued H&H q6h earlier today. Dr. Dulce Sellar aware of pt's dx and status. No rectal bleeding noted today. VSS. See new order received.

## 2012-12-27 NOTE — Progress Notes (Signed)
Spoke with patient about CPAP and he is refusing again tonight. I told him to call if he changed his mind. RT will continue to monitor.

## 2012-12-27 NOTE — Plan of Care (Signed)
Problem: Phase I Progression Outcomes Goal: Hemodynamically stable Outcome: Not Progressing Pt up to br x 2 since 7pm - 200cc and 300 cc  Of bright red blood w/ some stool.  Vitals- 97.5 108 18 129/83 @2100 .  cbg 116.  Pt c/o dizziness and pale in color about 2100.  Dr. Ewing Schlein and Lenny Pastel, NP notified and order for stat NM bleeding scan ordered.  Radiology informed me that the meds for the scan expired at 8pm and pt would either have to be transferred to [facility] for the scan or wait until 7am until more meds are brought in.  Dr. Ewing Schlein and Lenny Pastel notified of this update.  Order given to monitor pt closely and transfer pt if pt's condition declines.

## 2012-12-28 DIAGNOSIS — D62 Acute posthemorrhagic anemia: Secondary | ICD-10-CM | POA: Diagnosis present

## 2012-12-28 LAB — BASIC METABOLIC PANEL
BUN: 12 mg/dL (ref 6–23)
CO2: 25 mEq/L (ref 19–32)
Chloride: 103 mEq/L (ref 96–112)
GFR calc Af Amer: >90 mL/min (ref >90)
Potassium: 3.6 mEq/L (ref 3.5–5.1)
Sodium: 136 mEq/L (ref 135–145)

## 2012-12-28 LAB — CBC
HCT: 25.9 % — ABNORMAL LOW (ref 39.0–52.0)
MCH: 30 pg (ref 26.0–34.0)
MCV: 87.2 fL (ref 78.0–100.0)
RBC: 2.97 MIL/uL — ABNORMAL LOW (ref 4.22–5.81)
RDW: 14.5 % (ref 11.5–15.5)
WBC: 9.6 10*3/uL (ref 4.0–10.5)

## 2012-12-28 MED ORDER — ASPIRIN EC 81 MG PO TBEC: 81.0000 mg | DELAYED_RELEASE_TABLET | Freq: Every evening | ORAL | Status: DC

## 2012-12-28 MED ORDER — FERROUS SULFATE 325 (65 FE) MG PO TABS: 325.0000 mg | ORAL_TABLET | Freq: Two times a day (BID) | ORAL | Status: DC

## 2012-12-28 NOTE — Progress Notes (Signed)
Patient with no bloody bowel movements from 1900-0500

## 2012-12-28 NOTE — Progress Notes (Signed)
He feels great, anxious to go home, no further bleeding since 2am yetserday BM today without blood Will Dc home today to FU closely with Dr.Ganem Will start Iron at discharge, advised regarding side effects of this  Zannie Cove, MD [phone]

## 2013-01-19 NOTE — Discharge Summary (Signed)
Physician Discharge Summary  Oscar [name] WUJ:[id number] DOB: 10/03/1960 DOA: 12/26/2012  PCP: Nelwyn Salisbury, MD  Admit date: 12/26/2012 Discharge date: 12/28/2012  Time spent: 40 minutes  Recommendations for Outpatient Follow-up:  1. PCP in 1 week 2. CBC in 2weeks  Discharge Diagnoses:  Principal Problem:   Diverticular hemorrhage Active Problems:   HYPERLIPIDEMIA   HYPERTENSION   SLEEP APNEA   Acute blood loss anemia   Discharge Condition: stable  Diet recommendation: low sodium  Filed Weights   12/26/12 0414  Weight: 117.028 kg (258 lb)    History of present illness:  Oscar [name] is an 52 y.o. male with a PMH of diverticulosis and aspirin related GI bleeding who presents with a 12 hour history of persistent painless bright red rectal bleeding x 10 episodes. The patient also had some dizziness and diaphoresis last p.m. he typically takes daily aspirin, but did not take his dose last night secondary to the onset of the rectal bleeding. He is up-to-date with regard to his colonoscopies and reports that he gets these every 5 years and has known diverticulosis. Initially requested to be discharged home, but opted to be admitted for observation after having 4 bloody stools in the emergency department. Patient states that he estimates that each stool had about one quarter-1/2 cup of blood. Upon initial evaluation emergency department, the patient was found to have a hemoglobin of 15.7 mg/dL and was initially hypertensive, although his blood pressure has trended down over the course of his hospital stay thus far   Hospital Course:  1. Diverticular bleeding -appears to have subsided since 2am yesterday -Gi Dr.Magod following  -Hb stable for 24hours -discharged home and needs CBC repeated in 2 weeks and start Iron in 1 week  2. Acute blood loss anemia  -not transfused  3. HTN  -stable  4. Leukocytosis  -likely reactive , resolved      Consultations: [facility]  GI Discharge Exam: Filed Vitals:   12/28/12 0426  BP: 136/84  Pulse: 92  Temp: 97.8 F (36.6 C)  Resp: 18    General: AAOx3 Cardiovascular: S1S2/RRR Respiratory: CTAB  Discharge Instructions  Discharge Orders   Future Orders Complete By Expires   Diet - low sodium heart healthy  As directed    Increase activity slowly  As directed        Medication List         aspirin EC 81 MG tablet  Take 1 tablet (81 mg total) by mouth every evening. Can restart in 1 week if no more bleeding     atorvastatin 10 MG tablet  Commonly known as:  LIPITOR  Take 10 mg by mouth every morning.     B-complex with vitamin C tablet  Take 1 tablet by mouth every morning.     cholecalciferol 1000 UNITS tablet  Commonly known as:  VITAMIN D  Take 5,000 Units by mouth every evening.     co-enzyme Q-10 30 MG capsule  Take 30 mg by mouth every morning.     DHEA 25 MG Caps  Take 25 mg by mouth every morning.     ferrous sulfate 325 (65 FE) MG tablet  Commonly known as:  FERROUSUL  Take 1 tablet (325 mg total) by mouth 2 (two) times daily with a meal. Start in 2-3days     fish oil-omega-3 fatty acids 1000 MG capsule  Take 1 g by mouth 2 (two) times daily.     hydrochlorothiazide 25 MG tablet  Commonly known  as:  HYDRODIURIL  Take 25 mg by mouth every morning.     metFORMIN 500 MG tablet  Commonly known as:  GLUCOPHAGE  Take 500 mg by mouth 2 (two) times daily with a meal.     niacin 500 MG tablet  Take 500 mg by mouth at bedtime.     OVER THE COUNTER MEDICATION  Take 1 tablet by mouth every morning. OTC supplement "Inflammatone"     PROBIOTIC FORMULA PO  Take 1 tablet by mouth at bedtime.       No Known Allergies     Follow-up Information   Follow up with Graylin Shiver, MD. Schedule an appointment as soon as possible for a visit in 10 days.   Specialty:  Gastroenterology   Contact information:   [address], [address] [place] [address] [phone]        Follow up with CBC In 10 days.       The results of significant diagnostics from this hospitalization (including imaging, microbiology, ancillary and laboratory) are listed below for reference.    Significant Diagnostic Studies: No results found.  Microbiology: No results found for this or any previous visit (from the past 240 hour(s)).   Labs: Basic Metabolic Panel: No results found for this basename: NA, K, CL, CO2, GLUCOSE, BUN, CREATININE, CALCIUM, MG, PHOS,  in the last 168 hours Liver Function Tests: No results found for this basename: AST, ALT, ALKPHOS, BILITOT, PROT, ALBUMIN,  in the last 168 hours No results found for this basename: LIPASE, AMYLASE,  in the last 168 hours No results found for this basename: AMMONIA,  in the last 168 hours CBC: No results found for this basename: WBC, NEUTROABS, HGB, HCT, MCV, PLT,  in the last 168 hours Cardiac Enzymes: No results found for this basename: CKTOTAL, CKMB, CKMBINDEX, TROPONINI,  in the last 168 hours BNP: BNP (last 3 results) No results found for this basename: PROBNP,  in the last 8760 hours CBG: No results found for this basename: GLUCAP,  in the last 168 hours     Signed:  Tanika Bracco  [facility] 01/19/2013, 3:07 PM

## 2013-04-16 ENCOUNTER — Emergency Department (HOSPITAL_BASED_OUTPATIENT_CLINIC_OR_DEPARTMENT_OTHER): Payer: PRIVATE HEALTH INSURANCE

## 2013-04-16 ENCOUNTER — Emergency Department (HOSPITAL_BASED_OUTPATIENT_CLINIC_OR_DEPARTMENT_OTHER)
Admission: EM | Admit: 2013-04-16 | Discharge: 2013-04-16 | Disposition: A | Discharge status: Home / Self Care | Payer: PRIVATE HEALTH INSURANCE | Attending: Emergency Medicine | Admitting: Emergency Medicine

## 2013-04-16 ENCOUNTER — Encounter (HOSPITAL_BASED_OUTPATIENT_CLINIC_OR_DEPARTMENT_OTHER): Payer: Self-pay | Admitting: Emergency Medicine

## 2013-04-16 DIAGNOSIS — E785 Hyperlipidemia, unspecified: Secondary | ICD-10-CM | POA: Insufficient documentation

## 2013-04-16 DIAGNOSIS — Z79899 Other long term (current) drug therapy: Secondary | ICD-10-CM | POA: Insufficient documentation

## 2013-04-16 DIAGNOSIS — S52609A Unspecified fracture of lower end of unspecified ulna, initial encounter for closed fracture: Principal | ICD-10-CM

## 2013-04-16 DIAGNOSIS — E669 Obesity, unspecified: Secondary | ICD-10-CM | POA: Insufficient documentation

## 2013-04-16 DIAGNOSIS — Y9373 Activity, racquet and hand sports: Secondary | ICD-10-CM | POA: Insufficient documentation

## 2013-04-16 DIAGNOSIS — Y92838 Other recreation area as the place of occurrence of the external cause: Secondary | ICD-10-CM

## 2013-04-16 DIAGNOSIS — J45909 Unspecified asthma, uncomplicated: Secondary | ICD-10-CM | POA: Insufficient documentation

## 2013-04-16 DIAGNOSIS — Z9981 Dependence on supplemental oxygen: Secondary | ICD-10-CM | POA: Insufficient documentation

## 2013-04-16 DIAGNOSIS — Y9239 Other specified sports and athletic area as the place of occurrence of the external cause: Secondary | ICD-10-CM | POA: Insufficient documentation

## 2013-04-16 DIAGNOSIS — X500XXA Overexertion from strenuous movement or load, initial encounter: Secondary | ICD-10-CM | POA: Insufficient documentation

## 2013-04-16 DIAGNOSIS — S52509A Unspecified fracture of the lower end of unspecified radius, initial encounter for closed fracture: Secondary | ICD-10-CM | POA: Insufficient documentation

## 2013-04-16 DIAGNOSIS — Z8619 Personal history of other infectious and parasitic diseases: Secondary | ICD-10-CM | POA: Insufficient documentation

## 2013-04-16 DIAGNOSIS — G4730 Sleep apnea, unspecified: Secondary | ICD-10-CM | POA: Insufficient documentation

## 2013-04-16 DIAGNOSIS — I10 Essential (primary) hypertension: Secondary | ICD-10-CM | POA: Insufficient documentation

## 2013-04-16 DIAGNOSIS — Z87442 Personal history of urinary calculi: Secondary | ICD-10-CM | POA: Insufficient documentation

## 2013-04-16 DIAGNOSIS — Z7982 Long term (current) use of aspirin: Secondary | ICD-10-CM | POA: Insufficient documentation

## 2013-04-16 MED ORDER — HYDROCODONE-ACETAMINOPHEN 5-325 MG PO TABS: 1.0000 | ORAL_TABLET | ORAL | Status: DC | PRN

## 2013-04-16 NOTE — ED Notes (Signed)
Fell backward and caught himself with his left arm. Deformity noted to his left wrist.

## 2013-04-16 NOTE — Discharge Instructions (Signed)
Wrist Fracture °A wrist fracture is a break in one of the wrist bones. A cast or splint is used to keep the injured bones from moving. The cast or splint is often on for 4 6 weeks. °HOME CARE °· Keep your injured wrist raised (elevated). Move your fingers as much as possible. °· Put ice on the injured area for the first 1 2 days after you have been treated or as told by your doctor. °· Put ice in a plastic bag. °· Place a towel between your skin and the bag. °· Leave the ice on for 15-20 minutes at a time, every 2 hours while you are awake. °· Do not put pressure on any part of your cast or splint. °· Protect your cast or splint with a plastic bag before bathing or showering. Do not lower your cast or splint into water. °· Only take medicine as told by your doctor. °GET HELP RIGHT AWAY IF:  °· Your cast or splint gets damaged or breaks. °· You have very bad pain that does not go away. °· You have more puffiness (swelling) than before the cast was put on. °· Your skin or fingernails below the injured area turn blue, gray, or feel cold or numb. °· You lose some of your feeling in the fingers. °MAKE SURE YOU:  °· Understand these instructions. °· Will watch your condition. °· Will get help right away if you are not doing well or get worse. °Document Released: 09/04/2007 Document Revised: 12/11/2011 Document Reviewed: 09/30/2011 °Exit[employer]® Patient Information ©2014 [employer]. ° °

## 2013-04-16 NOTE — ED Provider Notes (Signed)
CSN: [id number]     Arrival date & time 04/16/13  1927 History   First MD Initiated Contact with Patient 04/16/13 1954     Chief Complaint  Patient presents with  . Wrist Injury   (Consider location/radiation/quality/duration/timing/severity/associated sxs/prior Treatment) HPI Comments: Pt was playing tennis tonight and he caught himself with his left arm playing tennis and now he is having pain and swelling to his left wrist:pt states that he has had a fracture to the area previously:pt denies numbness or weakness  The history is provided by the patient. No language interpreter was used.    Past Medical History  Diagnosis Date  . Hyperlipidemia   . Chickenpox   . Obesity   . Asthma     weather related, no inhalers  . Sleep apnea 2003    CPAP machine, pt does not know settings  . Nephrolithiasis     2-3 kidney stones in past  . Hepatitis A     as a child, no current liver problems  . Hypertension     he also sees Dr. Lynita Lombard in [place], [place]    Past Surgical History  Procedure Laterality Date  . Colonoscopy  07-09-09    benign polyp repeat 5 years, Dr.Sam Penelope Coop  . Tonsillectomy  as child  . Uvulectomy  yrs ago    for snoring  . Appendectomy   10-79yrs ago  . Hernia repair      repaired x 4  . Ventral hernia repair  04/09/2012    Procedure: LAPAROSCOPIC VENTRAL HERNIA;  Surgeon: Earnstine Regal, MD;  Location: [facility];  Service: General;  Laterality: N/A;  Laparoscopic Ventral Incisional Hernia Repair with Mesh  . Insertion of mesh  04/09/2012    Procedure: INSERTION OF MESH;  Surgeon: Earnstine Regal, MD;  Location: [facility];  Service: General;  Laterality: N/A;   Family History  Problem Relation Age of Onset  . Diabetes Brother   . Hypertension Brother   . Stroke Mother   . Obesity Brother    History  Substance Use Topics  . Smoking status: Never Smoker   . Smokeless tobacco: Never Used  . Alcohol Use: Yes     Comment: Once a week-wine    Review of Systems   Constitutional: Negative.   Respiratory: Negative.   Cardiovascular: Negative.     Allergies  Review of patient's allergies indicates no known allergies.  Home Medications   Current Outpatient Rx  Name  Route  Sig  Dispense  Refill  . aspirin EC 81 MG tablet   Oral   Take 1 tablet (81 mg total) by mouth every evening. Can restart in 1 week if no more bleeding         . atorvastatin (LIPITOR) 10 MG tablet   Oral   Take 10 mg by mouth every morning.         . B Complex-C (B-COMPLEX WITH VITAMIN C) tablet   Oral   Take 1 tablet by mouth every morning.          . cholecalciferol (VITAMIN D) 1000 UNITS tablet   Oral   Take 5,000 Units by mouth every evening.         Marland Kitchen co-enzyme Q-10 30 MG capsule   Oral   Take 30 mg by mouth every morning.         Marland Kitchen DHEA 25 MG CAPS   Oral   Take 25 mg by mouth every morning.          Marland Kitchen  ferrous sulfate (FERROUSUL) 325 (65 FE) MG tablet   Oral   Take 1 tablet (325 mg total) by mouth 2 (two) times daily with a meal. Start in 2-3days   60 tablet   0   . fish oil-omega-3 fatty acids 1000 MG capsule   Oral   Take 1 g by mouth 2 (two) times daily.         . hydrochlorothiazide (HYDRODIURIL) 25 MG tablet   Oral   Take 25 mg by mouth every morning.         . metFORMIN (GLUCOPHAGE) 500 MG tablet   Oral   Take 500 mg by mouth 2 (two) times daily with a meal.          . niacin 500 MG tablet   Oral   Take 500 mg by mouth at bedtime.          Marland Kitchen OVER THE COUNTER MEDICATION   Oral   Take 1 tablet by mouth every morning. OTC supplement "Inflammatone"         . Probiotic Product (PROBIOTIC FORMULA PO)   Oral   Take 1 tablet by mouth at bedtime.           BP 177/96  Pulse 94  Temp(Src) 98.8 F (37.1 C) (Oral)  Resp 20  Ht 6\' 3"  (1.905 m)  Wt 258 lb (117.028 kg)  BMI 32.25 kg/m2  SpO2 94% Physical Exam  Nursing note and vitals reviewed. Constitutional: He is oriented to person, place, and time. He appears  well-developed and well-nourished.  Cardiovascular: Normal rate and regular rhythm.   Pulmonary/Chest: Effort normal and breath sounds normal.  Musculoskeletal:  Obvious swelling noted to the left wrist:pulses intact  Neurological: He is alert and oriented to person, place, and time.  Skin: Skin is warm and dry.    ED Course  Procedures (including critical care time) Labs Review Labs Reviewed - No data to display Imaging Review Dg Wrist Complete Left  04/16/2013   CLINICAL DATA:  Patient fell playing tennis with left wrist pain and deformity  EXAM: LEFT WRIST - COMPLETE 3+ VIEW  COMPARISON:  None.  FINDINGS: There is a comminuted fracture involving the distal radial metaphysis and extending through the epiphysis into the radiocarpal joint. There is a mildly distracted fracture involving the ulnar styloid process. On the lateral view, there is mild apex volar angulation of the distal radius fracture.  IMPRESSION: Comminuted fracture of the distal radius extending into the radiocarpal joint.  Ulnar styloid fracture.   Electronically Signed   By: Skipper Cliche M.D.   On: 04/16/2013 19:59    EKG Interpretation   None       MDM   1. Radius and ulna distal fracture    Pt instructed on follow BB:[id number] given a script for hydrocodone and given follow up    Glendell Docker, NP 04/16/13 2038

## 2013-04-20 ENCOUNTER — Encounter (HOSPITAL_COMMUNITY): Payer: Self-pay | Admitting: *Deleted

## 2013-04-20 NOTE — Progress Notes (Signed)
PT denies having diabetes. Pt stated that all pre-op tests were completed by Dr. Harlow Asa; records requested. Pt stated that he had  "Executive" cardiac studies done at [facility] in [place] "around 2 years ago." Pt instructed to be NPO after midnight however, according to pt, he was informed that he could have a light breakfast. Pt instructed to confirm with MD NPO status.

## 2013-04-20 NOTE — Progress Notes (Signed)
Notified pt again regarding EKG and Chest X Ray; according to Epic and Dr. Gala Lewandowsky nurse,  pt has not had either done within a year. Pt again made aware not to eat or drink after midnight per hospital policy and anesthesia.

## 2013-04-21 NOTE — ED Provider Notes (Signed)
Medical screening examination/treatment/procedure(s) were performed by non-physician practitioner and as supervising physician I was immediately available for consultation/collaboration.    Dot Lanes, MD 04/21/13 [phone]

## 2013-04-22 ENCOUNTER — Encounter (HOSPITAL_COMMUNITY): Payer: PRIVATE HEALTH INSURANCE | Admitting: Anesthesiology

## 2013-04-22 ENCOUNTER — Ambulatory Visit (HOSPITAL_COMMUNITY)
Admission: RE | Admit: 2013-04-22 | Discharge: 2013-04-23 | Disposition: A | Discharge status: Home / Self Care | Payer: PRIVATE HEALTH INSURANCE | Source: Ambulatory Visit | Attending: Orthopedic Surgery | Admitting: Orthopedic Surgery

## 2013-04-22 ENCOUNTER — Encounter (HOSPITAL_COMMUNITY)
Admission: RE | Disposition: A | Discharge status: Home / Self Care | Payer: Self-pay | Source: Ambulatory Visit | Attending: Orthopedic Surgery

## 2013-04-22 ENCOUNTER — Encounter (HOSPITAL_COMMUNITY): Payer: Self-pay | Admitting: *Deleted

## 2013-04-22 ENCOUNTER — Ambulatory Visit (HOSPITAL_COMMUNITY): Payer: PRIVATE HEALTH INSURANCE

## 2013-04-22 ENCOUNTER — Ambulatory Visit (HOSPITAL_COMMUNITY): Payer: PRIVATE HEALTH INSURANCE | Admitting: Anesthesiology

## 2013-04-22 DIAGNOSIS — G5600 Carpal tunnel syndrome, unspecified upper limb: Secondary | ICD-10-CM | POA: Insufficient documentation

## 2013-04-22 DIAGNOSIS — Y9373 Activity, racquet and hand sports: Secondary | ICD-10-CM | POA: Insufficient documentation

## 2013-04-22 DIAGNOSIS — E785 Hyperlipidemia, unspecified: Secondary | ICD-10-CM | POA: Insufficient documentation

## 2013-04-22 DIAGNOSIS — I10 Essential (primary) hypertension: Secondary | ICD-10-CM | POA: Insufficient documentation

## 2013-04-22 DIAGNOSIS — I209 Angina pectoris, unspecified: Secondary | ICD-10-CM | POA: Insufficient documentation

## 2013-04-22 DIAGNOSIS — S52599A Other fractures of lower end of unspecified radius, initial encounter for closed fracture: Secondary | ICD-10-CM | POA: Insufficient documentation

## 2013-04-22 DIAGNOSIS — E669 Obesity, unspecified: Secondary | ICD-10-CM | POA: Insufficient documentation

## 2013-04-22 DIAGNOSIS — I2510 Atherosclerotic heart disease of native coronary artery without angina pectoris: Secondary | ICD-10-CM | POA: Insufficient documentation

## 2013-04-22 DIAGNOSIS — W010XXA Fall on same level from slipping, tripping and stumbling without subsequent striking against object, initial encounter: Secondary | ICD-10-CM | POA: Insufficient documentation

## 2013-04-22 DIAGNOSIS — S52502A Unspecified fracture of the lower end of left radius, initial encounter for closed fracture: Secondary | ICD-10-CM | POA: Diagnosis present

## 2013-04-22 DIAGNOSIS — G4730 Sleep apnea, unspecified: Secondary | ICD-10-CM | POA: Insufficient documentation

## 2013-04-22 HISTORY — DX: Gastrointestinal hemorrhage, unspecified: K92.2

## 2013-04-22 HISTORY — DX: Unspecified fracture of the lower end of left radius, initial encounter for closed fracture: S52.502A

## 2013-04-22 HISTORY — PX: CLOSED REDUCTION FINGER WITH PERCUTANEOUS PINNING: SHX5612

## 2013-04-22 HISTORY — DX: Pneumonia, unspecified organism: J18.9

## 2013-04-22 LAB — COMPREHENSIVE METABOLIC PANEL
ALT: 14 U/L (ref 0–53)
AST: 23 U/L (ref 0–37)
Albumin: 3.6 g/dL (ref 3.5–5.2)
Alkaline Phosphatase: 59 U/L (ref 39–117)
BUN: 12 mg/dL (ref 6–23)
CALCIUM: 8.9 mg/dL (ref 8.4–10.5)
CO2: 25 mEq/L (ref 19–32)
Chloride: 99 mEq/L (ref 96–112)
Creatinine, Ser: 1.07 mg/dL (ref 0.50–1.35)
GFR calc Af Amer: >90 mL/min (ref >90)
GFR calc non Af Amer: 78 mL/min — ABNORMAL LOW (ref >90)
Glucose, Bld: 89 mg/dL (ref 70–99)
Potassium: 4 mEq/L (ref 3.7–5.3)
Sodium: 137 mEq/L (ref 137–147)
Total Bilirubin: 0.5 mg/dL (ref 0.3–1.2)
Total Protein: 7.9 g/dL (ref 6.0–8.3)

## 2013-04-22 LAB — CBC
HCT: 50.6 % (ref 39.0–52.0)
Hemoglobin: 17.7 g/dL — ABNORMAL HIGH (ref 13.0–17.0)
MCH: 28.2 pg (ref 26.0–34.0)
MCHC: 35 g/dL (ref 30.0–36.0)
MCV: 80.7 fL (ref 78.0–100.0)
PLATELETS: 254 10*3/uL (ref 150–400)
RBC: 6.27 MIL/uL — ABNORMAL HIGH (ref 4.22–5.81)
RDW: 14.9 % (ref 11.5–15.5)
WBC: 10.9 10*3/uL — ABNORMAL HIGH (ref 4.0–10.5)

## 2013-04-22 SURGERY — CLOSED REDUCTION, FINGER, WITH PERCUTANEOUS PINNING
Anesthesia: Regional | Laterality: Left

## 2013-04-22 MED ORDER — OXYCODONE-ACETAMINOPHEN 10-325 MG PO TABS: 1.0000 | ORAL_TABLET | ORAL | Status: DC | PRN

## 2013-04-22 MED ORDER — B COMPLEX-C PO TABS
1.0000 | ORAL_TABLET | Freq: Every morning | ORAL | Status: DC
  Administered 2013-04-23: 1 via ORAL
  Filled 2013-04-22: qty 1

## 2013-04-22 MED ORDER — ONDANSETRON HCL 4 MG/2ML IJ SOLN: 4.0000 mg | Freq: Four times a day (QID) | INTRAMUSCULAR | Status: DC | PRN

## 2013-04-22 MED ORDER — LIDOCAINE HCL (CARDIAC) 20 MG/ML IV SOLN
INTRAVENOUS | Status: AC
  Filled 2013-04-22: qty 5

## 2013-04-22 MED ORDER — DOCUSATE SODIUM 100 MG PO CAPS: 100.0000 mg | ORAL_CAPSULE | Freq: Two times a day (BID) | ORAL | Status: DC

## 2013-04-22 MED ORDER — DEXTROSE 5 % IV SOLN: 3.0000 g | INTRAVENOUS | Status: DC

## 2013-04-22 MED ORDER — METHOCARBAMOL 500 MG PO TABS
500.0000 mg | ORAL_TABLET | Freq: Four times a day (QID) | ORAL | Status: DC | PRN
  Administered 2013-04-23: 500 mg via ORAL
  Filled 2013-04-22: qty 1

## 2013-04-22 MED ORDER — PHENYLEPHRINE HCL 10 MG/ML IJ SOLN
INTRAMUSCULAR | Status: DC | PRN
  Administered 2013-04-22: 80 ug via INTRAVENOUS

## 2013-04-22 MED ORDER — VITAMIN C 500 MG PO TABS
1000.0000 mg | ORAL_TABLET | Freq: Every day | ORAL | Status: DC
  Administered 2013-04-22 – 2013-04-23 (×2): 1000 mg via ORAL
  Filled 2013-04-22 (×2): qty 2

## 2013-04-22 MED ORDER — LIDOCAINE HCL (CARDIAC) 20 MG/ML IV SOLN
INTRAVENOUS | Status: DC | PRN
  Administered 2013-04-22: 50 mg via INTRAVENOUS

## 2013-04-22 MED ORDER — FENTANYL CITRATE 0.05 MG/ML IJ SOLN
INTRAMUSCULAR | Status: AC
  Filled 2013-04-22: qty 5

## 2013-04-22 MED ORDER — MIDAZOLAM HCL 2 MG/2ML IJ SOLN
INTRAMUSCULAR | Status: AC
  Administered 2013-04-22: 2 mg via INTRAVENOUS
  Filled 2013-04-22: qty 2

## 2013-04-22 MED ORDER — LACTATED RINGERS IV SOLN
INTRAVENOUS | Status: DC
  Administered 2013-04-22: 16:00:00 via INTRAVENOUS

## 2013-04-22 MED ORDER — LACTATED RINGERS IV SOLN: INTRAVENOUS | Status: DC

## 2013-04-22 MED ORDER — METHOCARBAMOL 500 MG PO TABS: 500.0000 mg | ORAL_TABLET | Freq: Four times a day (QID) | ORAL | Status: DC

## 2013-04-22 MED ORDER — PROPOFOL 10 MG/ML IV BOLUS
INTRAVENOUS | Status: DC | PRN
  Administered 2013-04-22: 200 mg via INTRAVENOUS

## 2013-04-22 MED ORDER — DIPHENHYDRAMINE HCL 25 MG PO CAPS
25.0000 mg | ORAL_CAPSULE | Freq: Four times a day (QID) | ORAL | Status: DC | PRN
  Filled 2013-04-22: qty 1

## 2013-04-22 MED ORDER — LACTATED RINGERS IV SOLN
INTRAVENOUS | Status: DC | PRN
  Administered 2013-04-22 (×2): via INTRAVENOUS

## 2013-04-22 MED ORDER — HYDROCODONE-ACETAMINOPHEN 10-325 MG PO TABS
1.0000 | ORAL_TABLET | ORAL | Status: DC | PRN
  Administered 2013-04-23: 2 via ORAL
  Filled 2013-04-22: qty 2

## 2013-04-22 MED ORDER — METFORMIN HCL 500 MG PO TABS
500.0000 mg | ORAL_TABLET | Freq: Two times a day (BID) | ORAL | Status: DC
  Administered 2013-04-23 (×2): 500 mg via ORAL
  Filled 2013-04-22 (×3): qty 1

## 2013-04-22 MED ORDER — NIACIN 500 MG PO TABS
500.0000 mg | ORAL_TABLET | Freq: Every day | ORAL | Status: DC
  Administered 2013-04-22: 500 mg via ORAL
  Filled 2013-04-22 (×2): qty 1

## 2013-04-22 MED ORDER — MIDAZOLAM HCL 5 MG/5ML IJ SOLN
INTRAMUSCULAR | Status: DC | PRN
  Administered 2013-04-22 (×2): 1 mg via INTRAVENOUS

## 2013-04-22 MED ORDER — ZOLPIDEM TARTRATE 5 MG PO TABS: 10.0000 mg | ORAL_TABLET | Freq: Every evening | ORAL | Status: DC | PRN

## 2013-04-22 MED ORDER — PHENYLEPHRINE 40 MCG/ML (10ML) SYRINGE FOR IV PUSH (FOR BLOOD PRESSURE SUPPORT)
PREFILLED_SYRINGE | INTRAVENOUS | Status: AC
  Filled 2013-04-22: qty 10

## 2013-04-22 MED ORDER — ATORVASTATIN CALCIUM 10 MG PO TABS
10.0000 mg | ORAL_TABLET | Freq: Every day | ORAL | Status: DC
  Administered 2013-04-23: 10 mg via ORAL
  Filled 2013-04-22: qty 1

## 2013-04-22 MED ORDER — VITAMIN D3 25 MCG (1000 UNIT) PO TABS
5000.0000 [IU] | ORAL_TABLET | Freq: Every evening | ORAL | Status: DC
  Administered 2013-04-22 – 2013-04-23 (×2): 5000 [IU] via ORAL
  Filled 2013-04-22 (×2): qty 5

## 2013-04-22 MED ORDER — CHLORHEXIDINE GLUCONATE 4 % EX LIQD: 60.0000 mL | Freq: Once | CUTANEOUS | Status: DC

## 2013-04-22 MED ORDER — DOCUSATE SODIUM 100 MG PO CAPS
100.0000 mg | ORAL_CAPSULE | Freq: Two times a day (BID) | ORAL | Status: DC
  Administered 2013-04-23: 100 mg via ORAL
  Filled 2013-04-22 (×2): qty 1

## 2013-04-22 MED ORDER — OXYCODONE HCL 5 MG PO TABS
5.0000 mg | ORAL_TABLET | ORAL | Status: DC | PRN
  Administered 2013-04-23 (×4): 10 mg via ORAL
  Filled 2013-04-22 (×5): qty 2

## 2013-04-22 MED ORDER — ADULT MULTIVITAMIN W/MINERALS CH
1.0000 | ORAL_TABLET | Freq: Every day | ORAL | Status: DC
  Administered 2013-04-23: 1 via ORAL
  Filled 2013-04-22: qty 1

## 2013-04-22 MED ORDER — ONDANSETRON HCL 4 MG PO TABS: 4.0000 mg | ORAL_TABLET | Freq: Four times a day (QID) | ORAL | Status: DC | PRN

## 2013-04-22 MED ORDER — CEFAZOLIN SODIUM 1-5 GM-% IV SOLN
INTRAVENOUS | Status: AC
  Administered 2013-04-22: 3 g via INTRAVENOUS
  Filled 2013-04-22: qty 50

## 2013-04-22 MED ORDER — 0.9 % SODIUM CHLORIDE (POUR BTL) OPTIME
TOPICAL | Status: DC | PRN
  Administered 2013-04-22: 1000 mL

## 2013-04-22 MED ORDER — METHOCARBAMOL 100 MG/ML IJ SOLN
500.0000 mg | Freq: Four times a day (QID) | INTRAVENOUS | Status: DC | PRN
  Filled 2013-04-22: qty 5

## 2013-04-22 MED ORDER — CEFAZOLIN SODIUM-DEXTROSE 2-3 GM-% IV SOLR
INTRAVENOUS | Status: AC
  Filled 2013-04-22: qty 50

## 2013-04-22 MED ORDER — CEFAZOLIN SODIUM-DEXTROSE 2-3 GM-% IV SOLR: 2.0000 g | INTRAVENOUS | Status: DC

## 2013-04-22 MED ORDER — HYDROMORPHONE HCL PF 1 MG/ML IJ SOLN: 0.5000 mg | INTRAMUSCULAR | Status: DC | PRN

## 2013-04-22 MED ORDER — CEPHALEXIN 500 MG PO CAPS
1000.0000 mg | ORAL_CAPSULE | Freq: Four times a day (QID) | ORAL | Status: DC
  Administered 2013-04-23 (×4): 1000 mg via ORAL
  Filled 2013-04-22 (×6): qty 2

## 2013-04-22 MED ORDER — HYDROCHLOROTHIAZIDE 25 MG PO TABS
25.0000 mg | ORAL_TABLET | Freq: Every morning | ORAL | Status: DC
  Administered 2013-04-23: 25 mg via ORAL
  Filled 2013-04-22: qty 1

## 2013-04-22 MED ORDER — PROPOFOL 10 MG/ML IV BOLUS
INTRAVENOUS | Status: AC
  Filled 2013-04-22: qty 20

## 2013-04-22 MED ORDER — FENTANYL CITRATE 0.05 MG/ML IJ SOLN
INTRAMUSCULAR | Status: DC | PRN
  Administered 2013-04-22: 50 ug via INTRAVENOUS
  Administered 2013-04-22 (×2): 100 ug via INTRAVENOUS

## 2013-04-22 MED ORDER — VITAMIN C 500 MG PO TABS: 500.0000 mg | ORAL_TABLET | Freq: Every day | ORAL | Status: DC

## 2013-04-22 MED ORDER — MIDAZOLAM HCL 2 MG/2ML IJ SOLN
INTRAMUSCULAR | Status: AC
  Filled 2013-04-22: qty 2

## 2013-04-22 MED ORDER — CEFAZOLIN SODIUM 1-5 GM-% IV SOLN
1.0000 g | Freq: Three times a day (TID) | INTRAVENOUS | Status: DC
  Filled 2013-04-22: qty 50

## 2013-04-22 MED ORDER — CEFAZOLIN SODIUM 1-5 GM-% IV SOLN
1.0000 g | INTRAVENOUS | Status: DC
  Filled 2013-04-22: qty 50

## 2013-04-22 MED ORDER — MIDAZOLAM HCL 2 MG/2ML IJ SOLN
2.0000 mg | Freq: Once | INTRAMUSCULAR | Status: AC
  Administered 2013-04-22: 2 mg via INTRAVENOUS

## 2013-04-22 SURGICAL SUPPLY — 55 items
BANDAGE ELASTIC 3 VELCRO ST LF (GAUZE/BANDAGES/DRESSINGS) ×2 IMPLANT
BANDAGE ELASTIC 4 VELCRO ST LF (GAUZE/BANDAGES/DRESSINGS) ×4 IMPLANT
BANDAGE ESMARK 6X9 LF (GAUZE/BANDAGES/DRESSINGS) ×1 IMPLANT
BANDAGE GAUZE ELAST BULKY 4 IN (GAUZE/BANDAGES/DRESSINGS) ×2 IMPLANT
BENZOIN TINCTURE PRP APPL 2/3 (GAUZE/BANDAGES/DRESSINGS) IMPLANT
BIT DRILL 2.2 SS TIBIAL (BIT) ×2 IMPLANT
BLADE SURG ROTATE 9660 (MISCELLANEOUS) IMPLANT
BNDG ESMARK 6X9 LF (GAUZE/BANDAGES/DRESSINGS) ×2
BNDG GAUZE ELAST 4 BULKY (GAUZE/BANDAGES/DRESSINGS) ×2 IMPLANT
COVER SURGICAL LIGHT HANDLE (MISCELLANEOUS) ×2 IMPLANT
CUFF TOURNIQUET SINGLE 18IN (TOURNIQUET CUFF) IMPLANT
CUFF TOURNIQUET SINGLE 24IN (TOURNIQUET CUFF) IMPLANT
DRAPE OEC MINIVIEW 54X84 (DRAPES) ×2 IMPLANT
DRSG ADAPTIC 3X8 NADH LF (GAUZE/BANDAGES/DRESSINGS) ×2 IMPLANT
DRSG EMULSION OIL 3X3 NADH (GAUZE/BANDAGES/DRESSINGS) IMPLANT
GAUZE XEROFORM 1X8 LF (GAUZE/BANDAGES/DRESSINGS) IMPLANT
GLOVE BIOGEL PI IND STRL 8.5 (GLOVE) ×1 IMPLANT
GLOVE BIOGEL PI INDICATOR 8.5 (GLOVE) ×1
GLOVE SURG ORTHO 8.0 STRL STRW (GLOVE) ×2 IMPLANT
GOWN STRL NON-REIN LRG LVL3 (GOWN DISPOSABLE) ×2 IMPLANT
K-WIRE IMPLANT
K-WIRE 1.6 (WIRE) ×1
K-WIRE FX5X1.6XNS BN SS (WIRE) ×1
KIT BASIN OR (CUSTOM PROCEDURE TRAY) ×2 IMPLANT
KIT ROOM TURNOVER OR (KITS) ×2 IMPLANT
KWIRE FX5X1.6XNS BN SS (WIRE) ×1 IMPLANT
NS IRRIG 1000ML POUR BTL (IV SOLUTION) ×2 IMPLANT
PACK ORTHO EXTREMITY (CUSTOM PROCEDURE TRAY) ×2 IMPLANT
PAD ARMBOARD 7.5X6 YLW CONV (MISCELLANEOUS) ×4 IMPLANT
PADDING CAST ABS 4INX4YD NS (CAST SUPPLIES) ×1
PADDING CAST ABS COTTON 4X4 ST (CAST SUPPLIES) ×1 IMPLANT
PEG LOCKING SMOOTH 2.2X18 (Peg) ×4 IMPLANT
PEG LOCKING SMOOTH 2.2X20 (Screw) ×6 IMPLANT
PEG LOCKING SMOOTH 2.2X22 (Screw) ×6 IMPLANT
PLATE STANDARD DVR LEFT (Plate) ×2 IMPLANT
PLATE STD DVR LT 24X51 (Plate) ×1 IMPLANT
SCREW LOCK 16X2.7X 3 LD TPR (Screw) ×3 IMPLANT
SCREW LOCK 18X2.7X 3 LD TPR (Screw) ×4 IMPLANT
SCREW LOCKING 2.7X16 (Screw) ×3 IMPLANT
SCREW LOCKING 2.7X18 (Screw) ×4 IMPLANT
SPLINT FIBERGLASS 4X30 (CAST SUPPLIES) ×4 IMPLANT
SPONGE GAUZE 4X4 12PLY (GAUZE/BANDAGES/DRESSINGS) ×2 IMPLANT
STRIP CLOSURE SKIN 1/2X4 (GAUZE/BANDAGES/DRESSINGS) IMPLANT
SUT ETHILON 4 0 P 3 18 (SUTURE) IMPLANT
SUT ETHILON 5 0 P 3 18 (SUTURE)
SUT NYLON ETHILON 5-0 P-3 1X18 (SUTURE) IMPLANT
SUT PROLENE 3 0 PS 2 (SUTURE) ×4 IMPLANT
SUT PROLENE 4 0 P 3 18 (SUTURE) ×2 IMPLANT
SUT PROLENE 4 0 PS 2 18 (SUTURE) ×2 IMPLANT
SUT VIC AB 2-0 SH 27 (SUTURE) ×2
SUT VIC AB 2-0 SH 27X BRD (SUTURE) ×2 IMPLANT
SUT VIC AB 3-0 FS2 27 (SUTURE) ×2 IMPLANT
TOWEL OR 17X24 6PK STRL BLUE (TOWEL DISPOSABLE) ×2 IMPLANT
TOWEL OR 17X26 10 PK STRL BLUE (TOWEL DISPOSABLE) ×2 IMPLANT
WATER STERILE IRR 1000ML POUR (IV SOLUTION) ×2 IMPLANT

## 2013-04-22 NOTE — Anesthesia Postprocedure Evaluation (Signed)
  Anesthesia Post-op Note  Patient: Oscar [name]  Procedure(s) Performed: Procedure(s): LEFT WRIST CLOSED REDUCTION CONVERTED TO OPEN REDUCTION INTERNAL FIXATION, LEFT CARPAL TUNNEL RELEASE (Left)  Patient Location: PACU  Anesthesia Type:GA combined with regional for post-op pain  Level of Consciousness: awake, alert  and oriented  Airway and Oxygen Therapy: FM O2  Post-op Pain: none  Post-op Assessment: Post-op Vital signs reviewed  Post-op Vital Signs: Reviewed  Complications: No apparent anesthesia complications

## 2013-04-22 NOTE — H&P (Signed)
Oscar [name] is an 53 y.o. male.   Chief Complaint:left wrist injury after fall HPI: pt fell 6 days ago sustaining closed distal radius fracture Pt also with longstanding carpal tunnel Pt here for surgery on left wrist and left hand carpal tunnel No prior surgery to wrist RHD  Past Medical History  Diagnosis Date  . Hyperlipidemia   . Chickenpox   . Obesity   . Sleep apnea 2003    CPAP machine, pt does not know settings  . Nephrolithiasis     2-3 kidney stones in past  . Hepatitis A     as a child, no current liver problems  . Hypertension     he also sees Dr. Lynita Lombard in [place], [place]   . Lower GI bleed     Hx: of  . Pneumonia     Past Surgical History  Procedure Laterality Date  . Colonoscopy  07-09-09    benign polyp repeat 5 years, Dr.Sam Penelope Coop  . Tonsillectomy  as child  . Uvulectomy  yrs ago    for snoring  . Appendectomy   10-57yr ago  . Hernia repair      repaired x 4  . Ventral hernia repair  04/09/2012    Procedure: LAPAROSCOPIC VENTRAL HERNIA;  Surgeon: TEarnstine Regal MD;  Location: [facility];  Service: General;  Laterality: N/A;  Laparoscopic Ventral Incisional Hernia Repair with Mesh  . Insertion of mesh  04/09/2012    Procedure: INSERTION OF MESH;  Surgeon: TEarnstine Regal MD;  Location: [facility];  Service: General;  Laterality: N/A;    Family History  Problem Relation Age of Onset  . Diabetes Brother   . Hypertension Brother   . Stroke Mother   . Obesity Brother    Social History:  reports that he has never smoked. He has never used smokeless tobacco. He reports that he drinks alcohol. He reports that he does not use illicit drugs.  Allergies:  Allergies  Allergen Reactions  . Norvasc [Amlodipine Besylate] Cough    Medications Prior to Admission  Medication Sig Dispense Refill  . atorvastatin (LIPITOR) 10 MG tablet Take 10 mg by mouth every morning.      . B Complex-C (B-COMPLEX WITH VITAMIN C) tablet Take 1 tablet by mouth every morning.       .  cholecalciferol (VITAMIN D) 1000 UNITS tablet Take 5,000 Units by mouth every evening.      .Marland Kitchenco-enzyme Q-10 30 MG capsule Take 30 mg by mouth every morning.      .Marland KitchenDHEA 25 MG CAPS Take 25 mg by mouth every morning.       . fish oil-omega-3 fatty acids 1000 MG capsule Take 1 g by mouth 2 (two) times daily.      . hydrochlorothiazide (HYDRODIURIL) 25 MG tablet Take 25 mg by mouth every morning.      . metFORMIN (GLUCOPHAGE) 500 MG tablet Take 500 mg by mouth 2 (two) times daily with a meal.       . niacin 500 MG tablet Take 500 mg by mouth at bedtime.       .Marland KitchenOVER THE COUNTER MEDICATION Take 1 tablet by mouth every morning. OTC supplement "Inflammatone"      . Probiotic Product (PROBIOTIC FORMULA PO) Take 1 tablet by mouth at bedtime.       .Marland Kitchenzolpidem (AMBIEN) 10 MG tablet Take 10 mg by mouth at bedtime as needed for sleep.  Results for orders placed during the hospital encounter of 04/22/13 (from the past 48 hour(s))  CBC     Status: Abnormal   Collection Time    04/22/13  2:44 PM      Result Value Range   WBC 10.9 (*) 4.0 - 10.5 K/uL   RBC 6.27 (*) 4.22 - 5.81 MIL/uL   Hemoglobin 17.7 (*) 13.0 - 17.0 g/dL   HCT 50.6  39.0 - 52.0 %   MCV 80.7  78.0 - 100.0 fL   MCH 28.2  26.0 - 34.0 pg   MCHC 35.0  30.0 - 36.0 g/dL   RDW 14.9  11.5 - 15.5 %   Platelets 254  150 - 400 K/uL  COMPREHENSIVE METABOLIC PANEL     Status: Abnormal   Collection Time    04/22/13  2:48 PM      Result Value Range   Sodium 137  137 - 147 mEq/L   Potassium 4.0  3.7 - 5.3 mEq/L   Chloride 99  96 - 112 mEq/L   CO2 25  19 - 32 mEq/L   Glucose, Bld 89  70 - 99 mg/dL   BUN 12  6 - 23 mg/dL   Creatinine, Ser 1.07  0.50 - 1.35 mg/dL   Calcium 8.9  8.4 - 10.5 mg/dL   Total Protein 7.9  6.0 - 8.3 g/dL   Albumin 3.6  3.5 - 5.2 g/dL   AST 23  0 - 37 U/L   ALT 14  0 - 53 U/L   Alkaline Phosphatase 59  39 - 117 U/L   Total Bilirubin 0.5  0.3 - 1.2 mg/dL   GFR calc non Af Amer 78 (*) >90 mL/min   GFR calc Af  Amer >90  >90 mL/min   Comment: (NOTE)     The eGFR has been calculated using the CKD EPI equation.     This calculation has not been validated in all clinical situations.     eGFR's persistently <90 mL/min signify possible Chronic Kidney     Disease.   No results found.  ROS NO RECENT ILLNESSES OR HOSPITALIZATIONS  Blood pressure 160/105, pulse 95, temperature 98.3 F (36.8 C), temperature source Oral, resp. rate 18, height 6' 2"  (1.88 m), weight 123.463 kg (272 lb 3 oz), SpO2 92.00%. Physical Exam  General Appearance:  Alert, cooperative, no distress, appears stated age  Head:  Normocephalic, without obvious abnormality, atraumatic  Eyes:  Pupils equal, conjunctiva/corneas clear,         Throat: Lips, mucosa, and tongue normal; teeth and gums normal  Neck: No visible masses     Lungs:   respirations unlabored  Chest Wall:  No tenderness or deformity  Heart:  Regular rate and rhythm,  Abdomen:   Soft, non-tender,         Extremities: LEFT WRIST: SKIN INTACT, FINGERS WARM WELL PERFUSED LIMITED MOTION OF WRIST AND DIGITS  Pulses: 2+ and symmetric  Skin: Skin color, texture, turgor normal, no rashes or lesions     Neurologic: Normal    Assessment/Plan LEFT WRIST DISTAL RADIUS FRACTURE LEFT HAND CARPAL TUNNEL SYNDROME  LEFT WRIST CLOSED REDUCTION AND PINNING, POSSIBLE OPEN REDUCTION AND INTERNAL FIXATION LEFT HAND CARPAL TUNNEL RELEASE  R/B/A DISCUSSED WITH PT OVER PHONE AND TODAY IN HOLDING AREA.  PT VOICED UNDERSTANDING OF PLAN CONSENT SIGNED DAY OF SURGERY PT SEEN AND EXAMINED PRIOR TO OPERATIVE PROCEDURE/DAY OF SURGERY SITE MARKED. QUESTIONS ANSWERED WILL LIKELY GO HOME FOLLOWING SURGERY  Iran Planas  W 04/22/2013, 4:50 PM

## 2013-04-22 NOTE — Brief Op Note (Signed)
04/22/2013  4:53 PM  PATIENT:  Oscar [name]  53 y.o. male  PRE-OPERATIVE DIAGNOSIS:  LEFT WRIST DISTAL RADIUS  FRACTURE LEFT CARPAL TUNNEL SYNDROME  POST-OPERATIVE DIAGNOSIS:  * No post-op diagnosis entered *  PROCEDURE:  Procedure(s): LEFT WRIST CLOSED REDUCTION PINNING POSSIBLE OPEN REDUCTION INTERNAL FIXATION, LEFT CARPAL TUNNEL RELEASE (Left)  SURGEON:  Surgeon(s) and Role:    * Linna Hoff, MD - Primary  PHYSICIAN ASSISTANT: none  ASSISTANTS: none   ANESTHESIA:   general  EBL:     BLOOD ADMINISTERED:none  DRAINS: none   LOCAL MEDICATIONS USED:  MARCAINE     SPECIMEN:  No Specimen  DISPOSITION OF SPECIMEN:  N/A  COUNTS:  YES  TOURNIQUET:    DICTATION: .Other Dictation: Dictation Number [id number]  PLAN OF CARE: Admit for overnight observation  PATIENT DISPOSITION:  PACU - hemodynamically stable.   Delay start of Pharmacological VTE agent (>24hrs) due to surgical blood loss or risk of bleeding: not applicable

## 2013-04-22 NOTE — Anesthesia Preprocedure Evaluation (Addendum)
Anesthesia Evaluation  Patient identified by MRN, date of birth, ID band Patient awake    Reviewed: Allergy & Precautions, H&P , NPO status , Patient's Chart, lab work & pertinent test results  History of Anesthesia Complications Negative for: history of anesthetic complications  Airway Mallampati: II TM Distance: >3 FB Neck ROM: Full    Dental  (+) Teeth Intact and Dental Advisory Given   Pulmonary neg shortness of breath, sleep apnea and Continuous Positive Airway Pressure Ventilation ,    Pulmonary exam normal       Cardiovascular Exercise Tolerance: Good hypertension, Pt. on medications - angina- CAD and - DOE Rhythm:Regular Rate:Normal     Neuro/Psych negative psych ROS   GI/Hepatic negative GI ROS, Neg liver ROS,   Endo/Other  negative endocrine ROS  Renal/GU negative Renal ROS  negative genitourinary   Musculoskeletal   Abdominal   Peds  Hematology negative hematology ROS (+)   Anesthesia Other Findings   Reproductive/Obstetrics negative OB ROS                        Anesthesia Physical Anesthesia Plan  ASA: II  Anesthesia Plan: Regional   Post-op Pain Management:    Induction: Intravenous  Airway Management Planned: Mask  Additional Equipment: None  Intra-op Plan:   Post-operative Plan: Extubation in OR  Informed Consent: I have reviewed the patients History and Physical, chart, labs and discussed the procedure including the risks, benefits and alternatives for the proposed anesthesia with the patient or authorized representative who has indicated his/her understanding and acceptance.   Dental advisory given  Plan Discussed with: CRNA, Surgeon and Anesthesiologist  Anesthesia Plan Comments:        Anesthesia Quick Evaluation

## 2013-04-22 NOTE — Transfer of Care (Signed)
Immediate Anesthesia Transfer of Care Note  Patient: Oscar [name]  Procedure(s) Performed: Procedure(s): LEFT WRIST CLOSED REDUCTION CONVERTED TO OPEN REDUCTION INTERNAL FIXATION, LEFT CARPAL TUNNEL RELEASE (Left)  Patient Location: PACU  Anesthesia Type:GA combined with regional for post-op pain  Level of Consciousness: awake  Airway & Oxygen Therapy: Patient Spontanous Breathing and Patient connected to face mask oxygen  Post-op Assessment: Report given to PACU RN and Post -op Vital signs reviewed and stable  Post vital signs: Reviewed and stable  Complications: No apparent anesthesia complications

## 2013-04-22 NOTE — Discharge Instructions (Signed)
KEEP BANDAGE CLEAN AND DRY CALL OFFICE FOR F/U APPT [phone] in 13 days Dr Caralyn Guile cell [phone] KEEP HAND ELEVATED ABOVE HEART OK TO APPLY ICE TO OPERATIVE AREA CONTACT OFFICE IF ANY WORSENING PAIN OR CONCERNS.

## 2013-04-22 NOTE — Anesthesia Procedure Notes (Addendum)
Procedure Name: LMA Insertion Date/Time: 04/22/2013 5:42 PM Performed by: Maude Leriche D Pre-anesthesia Checklist: Patient identified, Emergency Drugs available, Suction available, Patient being monitored and Timeout performed Patient Re-evaluated:Patient Re-evaluated prior to inductionOxygen Delivery Method: Circle system utilized Preoxygenation: Pre-oxygenation with 100% oxygen Intubation Type: IV induction LMA: LMA inserted LMA Size: 5.0 Number of attempts: 2 (first attempt with LMA #4, but did not seat well. Easily switched with LMA #5.) Placement Confirmation: positive ETCO2 and breath sounds checked- equal and bilateral Tube secured with: Tape Dental Injury: Teeth and Oropharynx as per pre-operative assessment     Anesthesia Regional Block:  Interscalene brachial plexus block  Pre-Anesthetic Checklist: ,, timeout performed, Correct Patient, Correct Site, Correct Laterality, Correct Procedure, Correct Position, site marked, Risks and benefits discussed,  Surgical consent,  Pre-op evaluation,  At surgeon's request and post-op pain management  Laterality: Left and Upper  Prep: chloraprep       Needles:  Injection technique: Single-shot  Needle Type: Echogenic Stimulator Needle          Additional Needles:  Procedures: ultrasound guided (picture in chart) and nerve stimulator Interscalene brachial plexus block Narrative:  Start time: 04/22/2013 5:01 PM End time: 04/22/2013 5:11 PM Injection made incrementally with aspirations every 5 mL.  Performed by: Personally  Anesthesiologist: Ermalene Postin

## 2013-04-23 MED ORDER — HYDROMORPHONE HCL 2 MG PO TABS: 2.0000 mg | ORAL_TABLET | ORAL | Status: DC | PRN

## 2013-04-23 MED ORDER — OXYCODONE HCL 5 MG PO TABS: 5.0000 mg | ORAL_TABLET | ORAL | Status: DC | PRN

## 2013-04-23 MED ORDER — OXYCODONE-ACETAMINOPHEN 5-325 MG PO TABS: 1.0000 | ORAL_TABLET | ORAL | Status: DC | PRN

## 2013-04-23 MED ORDER — OXYCODONE HCL 5 MG PO TABS
5.0000 mg | ORAL_TABLET | ORAL | Status: DC | PRN
  Administered 2013-04-23: 10 mg via ORAL

## 2013-04-23 MED ORDER — OXYCODONE-ACETAMINOPHEN 5-325 MG PO TABS
1.0000 | ORAL_TABLET | ORAL | Status: DC | PRN
  Administered 2013-04-23: 2 via ORAL
  Filled 2013-04-23: qty 2

## 2013-04-23 NOTE — Progress Notes (Signed)
Second RN to assess patient d/t patient c/o pain.  Instructed/educated again about elevation.  Ice also applied.  Nsg to continue to monitor for status changes.

## 2013-04-23 NOTE — Progress Notes (Signed)
Discharge information and education provided to patient.  Pt had no lines or drains to remove.  Pt discharged to home with assistance from family.  Pt left unit via wheelchair with NT assistance to vehicle.  All patient belongings with patient.

## 2013-04-23 NOTE — Op Note (Signed)
NAMEMICAJAH, DENNIN NO.:  [id number]  MEDICAL RECORD NO.:  [id number]  LOCATION:  5N27C                        FACILITY:  [facility]  PHYSICIAN:  Melrose Nakayama, MD  DATE OF BIRTH:  [birth year]  DATE OF PROCEDURE:  04/22/2013 DATE OF DISCHARGE:                              OPERATIVE REPORT   PREOPERATIVE DIAGNOSES: 1. Left wrist extra-articular distal radius fracture. 2. Left hand carpal tunnel syndrome.  POSTOPERATIVE DIAGNOSES: 1. Left wrist extra-articular distal radius fracture. 2. Left hand carpal tunnel syndrome.  ATTENDING PHYSICIAN:  Melrose Nakayama, MD, who scrubbed and present for the entire procedure.  ASSISTANT SURGEON:  None.  ANESTHESIA:  General via LMA and supraclavicular block.  SURGICAL PROCEDURE: 1. Open treatment of left wrist extra-articular distal radius     fracture. 2. Left wrist brachioradialis tendon release. 3. Radiographs left wrist 3 views. 4. Left hand carpal tunnel release.  SURGICAL INDICATION:  Mr. Kuck is a right-hand-dominant gentleman who sustained a closed injury  while playing tennis.  The patient sustained a displaced distal radius fracture.  The patient was seen and evaluated and recommended that he undergo the above procedure.  Risks, benefits, and alternatives were discussed in detail with the patient and signed informed consent was obtained.  Risks include, but not limited to bleeding, infection, damage to nearby nerves, arteries, or tendons, loss of motion of wrist and digits, incomplete relief of symptoms, and need for further surgical intervention.  DESCRIPTION OF PROCEDURE:  The patient was properly identified in the preop holding area and Abdifatah with a permanent marker made on the left wrist to indicate the correct operative site.  The patient was then brought back to the operating room, placed supine on the anesthesia room table where general anesthesia was administered.  The patient  had previously undergone supraclavicular block.  The patient received preoperative antibiotics prior to skin incision.  A well-padded tourniquet was then placed on left brachium, sealed with 1000 drape. Left upper extremity was then prepped and draped in normal sterile fashion.  Time-out was called, the correct site was identified, and the procedure was then begun.  Attention was then turned to the left wrist where a 7 cm incision made directly in the mid palm.  The limb was then elevated and tourniquet insufflated.  Dissection was carried down through skin and subcutaneous tissue.  The palmar fascia was then incised longitudinally and direct exposure of the transverse carpal ligament was then carried out.  Under direct visualization, distal 1/2 transverse carpal ligament released in its entirety.  Further exposure was then carried out proximally.  The remaining portion of transverse carpal ligament and antebrachial fascia were then released under direct visualization.  Careful protection was done of the median nerve throughout and was released along the ulnar border.  The wound was then thoroughly irrigated.  Tourniquet deflated.  Hemostasis was obtained. Skin closed using simple horizontal mattress 4-0 Prolene sutures. Attention was then turned to the wrist where several attempts were made at closed manipulation.  I could not reduce the ulnar column.  I could not get that lined up very well on the closed manipulation.  Several multiple attempts were made at  closed manipulation and this was unsuccessful and the patient did have high degree of dorsal comminution. Based on the unsuccessful attempts at closed manipulation, open reduction was then performed.  Limb was then elevated and tourniquet insufflated.  A longitudinal incision made directly over the FCR sheath separate incision.  The FCR was then opened, the FPL was then carefully swept out of the way and an L-shaped pronator  quadratus flap was then carried out and elevated.  The fracture site was then opened up and then open reduction was then performed, which reduced this very nicely.  I was able to visualize the ulnar column and see the reduction. Retractors were then held in place for visualization and to perform the open reduction.  Following this, a standard DVR cross lock plate was then applied.  Oblong screw hole was then placed proximally, the plate height was then adjusted using oblong screw hole.  Following this, again it was held in place distally with a K-wire and distal fixation was then carried out from an ulnar to radial direction with a distal locking pegs.  One radial styloid was then placed in the screw, the wound was then thoroughly irrigated.  Proximal fixation was then carried out with a combination of locking and nonlocking screws after appropriate drilling and depth gauge measurement.  In order to reduce the radial column and to maintain reduction, brachioradialis was carefully released off the radial styloid with protection of the first dorsal compartment tendons.  Copious wound irrigation done after internal fixation.  Final radiographs were then obtained.  Following this, pronator quadriceps was then closed with 2-0 Vicryl, subcutaneous tissue was closed with 3-0 Vicryl, and skin closed with simple 3-0 Prolene sutures.  Adaptic and tourniquet had been deflated.  Adaptic dressing, sterile compressive bandage were then applied.  The patient was then placed in a well-molded sugar-tong splint.  Intraoperative radiographs 3 views of the wrist did show the internal fixation in place.  There was good position in both planes.  POSTPROCEDURAL PLAN:  The patient was admitted overnight for IV antibiotics and pain control, discharge in the morning, see him back in the office in approximately 2 weeks for wound check, suture removal, and application of a short-arm cast, and then x-rays out of  the splint. Total immobilization for 4 weeks, and then begin an outpatient therapy regimen.  Radiographs at each visit.     Melrose Nakayama, MD     FWO/MEDQ  D:  04/22/2013  T:  04/23/2013  Job:  [id number]

## 2013-04-23 NOTE — Discharge Summary (Signed)
Physician Discharge Summary  Patient ID: [name] MRN: [id number] DOB/AGE: 1960/04/03 53 y.o.  Admit date: 04/22/2013 Discharge date: 04/23/2013  Admission Diagnoses: LEFT WRIST DISTAL RADIUS  FRACTURE LEFT CARPAL TUNNEL SYNDROME Past Medical History  Diagnosis Date  . Hyperlipidemia   . Chickenpox   . Obesity   . Sleep apnea 2003    CPAP machine, pt does not know settings  . Nephrolithiasis     2-3 kidney stones in past  . Hepatitis A     as a child, no current liver problems  . Hypertension     he also sees Dr. Lynita Lombard in [place], [place]   . Lower GI bleed     Hx: of  . Pneumonia     Discharge Diagnoses:  Active Problems:   Fracture of left distal radius   Surgeries: Procedure(s): LEFT WRIST CLOSED REDUCTION CONVERTED TO OPEN REDUCTION INTERNAL FIXATION, LEFT CARPAL TUNNEL RELEASE on 04/22/2013    Consultants:    Discharged Condition: Improved  Hospital Course: Oscar [name] is an 53 y.o. male who was admitted 04/22/2013 with a chief complaint of No chief complaint on file. , and found to have a diagnosis of LEFT WRIST DISTAL RADIUS  FRACTURE LEFT CARPAL TUNNEL SYNDROME.  They were brought to the operating room on 04/22/2013 and underwent Procedure(s): LEFT WRIST CLOSED REDUCTION CONVERTED TO OPEN REDUCTION INTERNAL FIXATION, LEFT CARPAL TUNNEL RELEASE.    They were given perioperative antibiotics: Anti-infectives   Start     Dose/Rate Route Frequency Ordered Stop   04/23/13 0600  ceFAZolin (ANCEF) IVPB 2 g/50 mL premix  Status:  Discontinued     2 g 100 mL/hr over 30 Minutes Intravenous On call to O.R. 04/22/13 1443 04/22/13 1514   04/23/13 0600  ceFAZolin (ANCEF) 3 g in dextrose 5 % 50 mL IVPB  Status:  Discontinued     3 g 160 mL/hr over 30 Minutes Intravenous On call to O.R. 04/22/13 1514 04/22/13 2054   04/23/13 0400  ceFAZolin (ANCEF) IVPB 1 g/50 mL premix  Status:  Discontinued     1 g 100 mL/hr over 30 Minutes Intravenous 3 times per day 04/22/13 2056  04/22/13 2302   04/23/13 0000  cephALEXin (KEFLEX) capsule 1,000 mg     1,000 mg Oral 4 times per day 04/22/13 2301     04/22/13 2200  ceFAZolin (ANCEF) IVPB 1 g/50 mL premix     1 g 100 mL/hr over 30 Minutes Intravenous NOW 04/22/13 2056 04/23/13 2200   04/22/13 1719  ceFAZolin (ANCEF) 1-5 GM-% IVPB    Comments:  Henrine Screws   : cabinet override      04/22/13 1719 04/22/13 1736   04/22/13 1719  ceFAZolin (ANCEF) 2-3 GM-% IVPB SOLR    Comments:  Henrine Screws   : cabinet override      04/22/13 1719 04/23/13 0529    .  They were given sequential compression devices, early ambulation, and Other (comment)ambulation for DVT prophylaxis.  Recent vital signs: Patient Vitals for the past 24 hrs:  BP Temp Temp src Pulse Resp SpO2  04/23/13 1755 174/107 mmHg 98.3 F (36.8 C) Oral 99 18 96 %  04/23/13 1453 169/99 mmHg 97.8 F (36.6 C) Oral 102 18 97 %  04/23/13 0548 148/88 mmHg 98.6 F (37 C) - 97 18 99 %  04/22/13 2100 160/102 mmHg 97.6 F (36.4 C) - 69 18 99 %  04/22/13 2015 169/101 mmHg 97.8 F (36.6 C) - 86 15 100 %  04/22/13  2000 - - - 82 16 100 %  04/22/13 1948 181/121 mmHg 98 F (36.7 C) - 88 22 -  .  Recent laboratory studies: Dg Chest 2 View  04/22/2013   CLINICAL DATA:  Hypertension, sleep apnea, preoperative evaluation for orthopedic surgery  EXAM: CHEST  2 VIEW  COMPARISON:  04/17/2012  FINDINGS: The heart size and mediastinal contours are within normal limits. Both lungs are clear. The visualized skeletal structures are unremarkable.  IMPRESSION: No active cardiopulmonary disease.   Electronically Signed   By: Oscar [name] M.D.   On: 04/22/2013 17:01    Discharge Medications:     Medication List         atorvastatin 10 MG tablet  Commonly known as:  LIPITOR  Take 10 mg by mouth every morning.     B-complex with vitamin C tablet  Take 1 tablet by mouth every morning.     cholecalciferol 1000 UNITS tablet  Commonly known as:  VITAMIN D  Take 5,000 Units by mouth  every evening.     co-enzyme Q-10 30 MG capsule  Take 30 mg by mouth every morning.     DHEA 25 MG Caps  Take 25 mg by mouth every morning.     docusate sodium 100 MG capsule  Commonly known as:  COLACE  Take 1 capsule (100 mg total) by mouth 2 (two) times daily.     fish oil-omega-3 fatty acids 1000 MG capsule  Take 1 g by mouth 2 (two) times daily.     hydrochlorothiazide 25 MG tablet  Commonly known as:  HYDRODIURIL  Take 25 mg by mouth every morning.     metFORMIN 500 MG tablet  Commonly known as:  GLUCOPHAGE  Take 500 mg by mouth 2 (two) times daily with a meal.     methocarbamol 500 MG tablet  Commonly known as:  ROBAXIN  Take 1 tablet (500 mg total) by mouth 4 (four) times daily.     niacin 500 MG tablet  Take 500 mg by mouth at bedtime.     OVER THE COUNTER MEDICATION  Take 1 tablet by mouth every morning. OTC supplement "Inflammatone"     oxyCODONE-acetaminophen 10-325 MG per tablet  Commonly known as:  PERCOCET  Take 1 tablet by mouth every 4 (four) hours as needed for pain.     PROBIOTIC FORMULA PO  Take 1 tablet by mouth at bedtime.     vitamin C 500 MG tablet  Commonly known as:  ASCORBIC ACID  Take 1 tablet (500 mg total) by mouth daily.     zolpidem 10 MG tablet  Commonly known as:  AMBIEN  Take 10 mg by mouth at bedtime as needed for sleep.        Diagnostic Studies: Dg Chest 2 View  04/22/2013   CLINICAL DATA:  Hypertension, sleep apnea, preoperative evaluation for orthopedic surgery  EXAM: CHEST  2 VIEW  COMPARISON:  04/17/2012  FINDINGS: The heart size and mediastinal contours are within normal limits. Both lungs are clear. The visualized skeletal structures are unremarkable.  IMPRESSION: No active cardiopulmonary disease.   Electronically Signed   By: Oscar [name] M.D.   On: 04/22/2013 17:01   Dg Wrist Complete Left  04/16/2013   CLINICAL DATA:  Patient fell playing tennis with left wrist pain and deformity  EXAM: LEFT WRIST - COMPLETE 3+  VIEW  COMPARISON:  None.  FINDINGS: There is a comminuted fracture involving the distal radial metaphysis and extending through  the epiphysis into the radiocarpal joint. There is a mildly distracted fracture involving the ulnar styloid process. On the lateral view, there is mild apex volar angulation of the distal radius fracture.  IMPRESSION: Comminuted fracture of the distal radius extending into the radiocarpal joint.  Ulnar styloid fracture.   Electronically Signed   By: Skipper Cliche M.D.   On: 04/16/2013 19:59    They benefited maximally from their hospital stay and there were no complications.     Disposition: 01-Home or Self Care      Follow-up Information   Schedule an appointment as soon as possible for a visit with Linna Hoff, MD.   Specialty:  Orthopedic Surgery   Contact information:   [address] [place] [address] [phone]        Signed: Linna Hoff 04/23/2013, 7:32 PM

## 2013-04-23 NOTE — Progress Notes (Signed)
Pt brought to floor without IV access from PACU.  Pt reported previous issues obtaining IV access.  IV team contacted per pt request.  IV team unable to locate any veins with doppler. IV fluids and antibiotics scheduled.  On-call PA paged for alternative orders.  Telephone orders received, read back and entered.  Patient tolerating PO medications well and encouraged to drink plenty of fluids.  Will continue to monitor.

## 2013-04-23 NOTE — Progress Notes (Signed)
Patient placed on auto CPAP with our machine and home mask. Tolerating well at this time. RT will continue to monitor.

## 2013-04-23 NOTE — Progress Notes (Addendum)
Patient is stating "pain medication is not working" although patient is asleep when I entered room.  Added Dilaudid 2-4mg  PO q4hrs added per verbal order.  Attempted to educate patient re; pain medication side effects.  Patient also did not have LUE elevated.  Good ROM and CMS in left fingers with slight swelling.  Ice also applied.  Nsg to continue to monitor for status changes. Prescriptions given to wife to fill

## 2013-04-26 ENCOUNTER — Encounter (HOSPITAL_COMMUNITY): Payer: Self-pay | Admitting: Orthopedic Surgery

## 2013-05-04 MED ORDER — ROPIVACAINE HCL 5 MG/ML IJ SOLN
INTRAMUSCULAR | Status: DC | PRN
  Administered 2013-04-22: 20 mL via PERINEURAL

## 2013-05-04 NOTE — Addendum Note (Signed)
Addendum created 05/04/13 1709 by Laurie Panda, MD   Modules edited: Anesthesia Blocks and Procedures, Anesthesia Medication Administration

## 2013-05-07 NOTE — Addendum Note (Signed)
Addendum created 05/07/13 1038 by Laurie Panda, MD   Modules edited: Anesthesia Blocks and Procedures

## 2014-04-13 ENCOUNTER — Ambulatory Visit (INDEPENDENT_AMBULATORY_CARE_PROVIDER_SITE_OTHER): Payer: 59 | Admitting: Family Medicine

## 2014-04-13 ENCOUNTER — Encounter: Payer: Self-pay | Admitting: Family Medicine

## 2014-04-13 VITALS — BP 159/100 | HR 96 | Temp 98.4°F | Ht 74.0 in | Wt 283.0 lb

## 2014-04-13 DIAGNOSIS — J209 Acute bronchitis, unspecified: Secondary | ICD-10-CM

## 2014-04-13 MED ORDER — AMOXICILLIN-POT CLAVULANATE 875-125 MG PO TABS: 1.0000 | ORAL_TABLET | Freq: Two times a day (BID) | ORAL | Status: DC

## 2014-04-13 NOTE — Progress Notes (Signed)
Pre visit review using our clinic review tool, if applicable. No additional management support is needed unless otherwise documented below in the visit note. 

## 2014-04-13 NOTE — Progress Notes (Signed)
   Subjective:    Patient ID: Oscar [name], male    DOB: [birth year], 54 y.o.   MRN: [id number]  HPI Here for one week of dry cough, stuffy head, and PND. No ST or fever. On Delsym.    Review of Systems  Constitutional: Negative.   HENT: Positive for congestion and postnasal drip. Negative for sinus pressure.   Eyes: Negative.   Respiratory: Positive for cough. Negative for shortness of breath and wheezing.        Objective:   Physical Exam  Constitutional: He appears well-developed and well-nourished.  HENT:  Right Ear: External ear normal.  Left Ear: External ear normal.  Nose: Nose normal.  Mouth/Throat: Oropharynx is clear and moist.  Eyes: Conjunctivae are normal.  Pulmonary/Chest: Effort normal. No respiratory distress. He has no wheezes. He has no rales.  Scattered rhonchi   Lymphadenopathy:    He has no cervical adenopathy.          Assessment & Plan:  Add Mucinex.

## 2014-05-03 ENCOUNTER — Ambulatory Visit (INDEPENDENT_AMBULATORY_CARE_PROVIDER_SITE_OTHER): Payer: 59 | Admitting: Family Medicine

## 2014-05-03 ENCOUNTER — Encounter: Payer: Self-pay | Admitting: Family Medicine

## 2014-05-03 VITALS — BP 145/75 | HR 101 | Temp 98.8°F | Ht 74.0 in | Wt 283.0 lb

## 2014-05-03 DIAGNOSIS — J209 Acute bronchitis, unspecified: Secondary | ICD-10-CM

## 2014-05-03 MED ORDER — CLARITHROMYCIN 500 MG PO TABS: 500.0000 mg | ORAL_TABLET | Freq: Two times a day (BID) | ORAL | Status: DC

## 2014-05-03 MED ORDER — CEFTRIAXONE SODIUM 1 G IJ SOLR
1.0000 g | Freq: Once | INTRAMUSCULAR | Status: AC
  Administered 2014-05-03: 1 g via INTRAMUSCULAR

## 2014-05-03 NOTE — Progress Notes (Signed)
Pre visit review using our clinic review tool, if applicable. No additional management support is needed unless otherwise documented below in the visit note. 

## 2014-05-03 NOTE — Addendum Note (Signed)
Addended by: Aggie Hacker A on: 05/03/2014 05:06 PM   Modules accepted: Orders

## 2014-05-03 NOTE — Progress Notes (Signed)
   Subjective:    Patient ID: Oscar [name], male    DOB: [birth year], 54 y.o.   MRN: [id number]  HPI Here for continued chest congestion and coughing up green sputum. No fever. He took a course of Augmentin and felt better for a few days but then the sx returned.    Review of Systems  Constitutional: Negative.   HENT: Negative.   Eyes: Negative.   Respiratory: Positive for cough, chest tightness and wheezing. Negative for shortness of breath.        Objective:   Physical Exam  Constitutional: He appears well-developed and well-nourished.  HENT:  Right Ear: External ear normal.  Left Ear: External ear normal.  Nose: Nose normal.  Mouth/Throat: Oropharynx is clear and moist.  Eyes: Conjunctivae are normal.  Neck: No thyromegaly present.  Pulmonary/Chest: Effort normal. No respiratory distress.  Scattered rhonchi and soft rales at the left base   Lymphadenopathy:    He has no cervical adenopathy.          Assessment & Plan:  Bronchitis vs an early pneumonia. Get a CXR. Given a shot of Rocephin and then Biaxin for 10 days. Recheck prn

## 2014-05-04 ENCOUNTER — Ambulatory Visit (INDEPENDENT_AMBULATORY_CARE_PROVIDER_SITE_OTHER)
Admission: RE | Admit: 2014-05-04 | Discharge: 2014-05-04 | Disposition: A | Discharge status: Home / Self Care | Payer: 59 | Source: Ambulatory Visit | Attending: Family Medicine | Admitting: Family Medicine

## 2014-05-04 ENCOUNTER — Telehealth: Payer: Self-pay | Admitting: Family Medicine

## 2014-05-04 DIAGNOSIS — J209 Acute bronchitis, unspecified: Secondary | ICD-10-CM

## 2014-05-04 NOTE — Telephone Encounter (Signed)
Pt needs xray result from this morning

## 2014-05-04 NOTE — Telephone Encounter (Signed)
I spoke with pt and gave results.  

## 2014-10-25 DIAGNOSIS — K439 Ventral hernia without obstruction or gangrene: Secondary | ICD-10-CM | POA: Insufficient documentation

## 2015-07-19 ENCOUNTER — Telehealth: Payer: Self-pay | Admitting: Family Medicine

## 2015-07-19 DIAGNOSIS — Z8249 Family history of ischemic heart disease and other diseases of the circulatory system: Secondary | ICD-10-CM

## 2015-07-19 NOTE — Telephone Encounter (Signed)
I left a voice message for pt with below information.  

## 2015-07-19 NOTE — Telephone Encounter (Signed)
Referral was done  

## 2015-07-19 NOTE — Telephone Encounter (Signed)
Pt would like to have a referral to a Cardiologist within our group.  Pt state it is a preventive due to family history.

## 2015-10-04 NOTE — Progress Notes (Signed)
Cardiology Office Note    Date:  10/10/2015   ID:  Thedore Mins, DOB 03/27/1961, MRN [id number]  PCP:  Laurey Morale, MD  Cardiologist:  Kirk Ruths, MD   Chief Complaint  Patient presents with  . Hypertension    New patient evaluation    History of Present Illness:  Oscar [name] is a 55 y.o. male For evaluation of hypertension. Patient has a family history of coronary disease as his mother had a myocardial infarction in her 44s. He recently had an evaluation at [facility]. This included what sounds to have been a calcium score and treadmill. He also had laboratories done. His calcium score was apparently high with a negative treadmill and he presents for evaluation. He denies dyspnea on exertion, orthopnea, PND, pedal edema, exertional chest pain or syncope.    Past Medical History  Diagnosis Date  . Hyperlipidemia   . Chickenpox   . Obesity   . Sleep apnea 2003    CPAP machine, pt does not know settings  . Nephrolithiasis     2-3 kidney stones in past  . Hepatitis A     as a child, no current liver problems  . Hypertension     he also sees Dr. Lynita Lombard in [place], [place]   . Lower GI bleed     Hx: of  . Pneumonia     Past Surgical History  Procedure Laterality Date  . Colonoscopy  07-09-09    benign polyp repeat 5 years, Dr.Sam Penelope Coop  . Tonsillectomy  as child  . Uvulectomy  yrs ago    for snoring  . Appendectomy   10-5yrs ago  . Hernia repair      repaired x 4  . Ventral hernia repair  04/09/2012    Procedure: LAPAROSCOPIC VENTRAL HERNIA;  Surgeon: Earnstine Regal, MD;  Location: [facility];  Service: General;  Laterality: N/A;  Laparoscopic Ventral Incisional Hernia Repair with Mesh  . Insertion of mesh  04/09/2012    Procedure: INSERTION OF MESH;  Surgeon: Earnstine Regal, MD;  Location: [facility];  Service: General;  Laterality: N/A;  . Closed reduction finger with percutaneous pinning Left 04/22/2013    Procedure: LEFT WRIST CLOSED REDUCTION CONVERTED TO OPEN REDUCTION  INTERNAL FIXATION, LEFT CARPAL TUNNEL RELEASE;  Surgeon: Linna Hoff, MD;  Location: [facility];  Service: Orthopedics;  Laterality: Left;    Current Medications: Outpatient Prescriptions Prior to Visit  Medication Sig Dispense Refill  . cholecalciferol (VITAMIN D) 1000 UNITS tablet Take 5,000 Units by mouth every evening.    . clarithromycin (BIAXIN) 500 MG tablet Take 1 tablet (500 mg total) by mouth 2 (two) times daily. 20 tablet 0  . DHEA 25 MG CAPS Take 25 mg by mouth every morning.     . hydrochlorothiazide (HYDRODIURIL) 25 MG tablet Take 25 mg by mouth every morning.    . niacin 500 MG tablet Take 500 mg by mouth at bedtime.     . Probiotic Product (PROBIOTIC FORMULA PO) Take 1 tablet by mouth at bedtime.     . vitamin C (ASCORBIC ACID) 500 MG tablet Take 1 tablet (500 mg total) by mouth daily. 50 tablet 0  . zolpidem (AMBIEN) 10 MG tablet Take 10 mg by mouth at bedtime as needed for sleep.     Marland Kitchen atorvastatin (LIPITOR) 10 MG tablet Take 10 mg by mouth once a week. 4 times per week    . B Complex-C (B-COMPLEX WITH VITAMIN C) tablet Take 1  tablet by mouth every morning.      No facility-administered medications prior to visit.     Allergies:   Norvasc   Social History   Social History  . Marital Status: Married    Spouse Name: Baxter Flattery  . Number of Children: 4  . Years of Education: 13   Occupational History  . 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 Smoking status: Never Smoker   . Smokeless tobacco: Never Used  . Alcohol Use: 0.0 oz/week    0 Standard drinks or equivalent per week     Comment: Once a week-wine  . Drug Use: No  . Sexual Activity: No   Other Topics Concern  . None   Social History Narrative   Married.  Lives with wife.  Independent of ADLs.     Family History:  The patient's family history includes CAD in his mother; Diabetes in his brother; Hypertension in his brother; Obesity in his brother; Stroke in his mother.    ROS:   Please see the history of present illness.    No weight loss, productive cough, hemoptysis, dysphagia, odynophagia, melena, hematochezia, dysuria, hematuria, rash, seizure activity, orthopnea, PND, pedal edema, claudication. All remaining systems negative.   PHYSICAL EXAM:   VS:  BP 160/102 mmHg  Pulse 93  Ht 6\' 2"  (1.88 m)  Wt 275 lb (124.739 kg)  BMI 35.29 kg/m2   GEN: Well nourished, obese in no acute distress HEENT: normal Neck: no JVD, carotid bruits, or masses Cardiac: RRR; no murmurs, rubs, or gallops,no edema  Respiratory:  clear to auscultation bilaterally, normal work of breathing GI: soft, nontender, nondistended, + BS MS: no deformity or atrophy Skin: warm and dry, no rash, Positive tattoo Neuro:  Alert and Oriented x 3, Strength and sensation are intact Psych: euthymic mood, full affect  Wt Readings from Last 3 Encounters:  10/10/15 275 lb (124.739 kg)  05/03/14 283 lb (128.368 kg)  04/13/14 283 lb (128.368 kg)      Assessment and plan 1 Presumed coronary artery disease-the patient states he will send all of his recent results from coronary calcium score, stress test and laboratories for our review. It sounds as though he had a high calcium score with negative stress test.Add aspirin 81 mg daily. Continue statin. If calcium score extremely high I will likely increase Crestor to 40 mg daily. We discussed lifestyle modification including diet, exercise and weight loss. 2 hypertension-blood pressure is elevated today but he follows this closely at home. It is typically controlled. Continue present medications. I have asked him to bring his blood pressure cuff to the office to correlate with ours. 3 hyperlipidemia-we will have him for his most recent lipids to [place] and adjust his statin as indicated. 4 possible diabetes mellitus-hemoglobin A1c has been elevated previously. I have asked him to follow up with primary care for this issue.  We will see him back in 6  months. Kirk Ruths, MD     Medication Adjustments/Labs and Tests Ordered: Current medicines are reviewed at length with the patient today.  Concerns regarding medicines are outlined above.  Medication changes, Labs and Tests ordered today are listed in the Patient Instructions below. There are no Patient Instructions on file for this visit.   Signed, Kirk Ruths, MD  10/10/2015 3:03 PM     [facility]

## 2015-10-10 ENCOUNTER — Ambulatory Visit (INDEPENDENT_AMBULATORY_CARE_PROVIDER_SITE_OTHER): Payer: 59 | Admitting: Cardiology

## 2015-10-10 ENCOUNTER — Encounter: Payer: Self-pay | Admitting: Cardiology

## 2015-10-10 VITALS — BP 160/102 | HR 93 | Ht 74.0 in | Wt 275.0 lb

## 2015-10-10 DIAGNOSIS — I10 Essential (primary) hypertension: Secondary | ICD-10-CM

## 2015-10-10 DIAGNOSIS — I2510 Atherosclerotic heart disease of native coronary artery without angina pectoris: Secondary | ICD-10-CM | POA: Diagnosis not present

## 2015-10-10 DIAGNOSIS — E785 Hyperlipidemia, unspecified: Secondary | ICD-10-CM

## 2015-10-10 NOTE — Patient Instructions (Signed)
Your physician wants you to follow-up in: 6 months with Dr.Crenshaw. You will receive a reminder letter in the mail two months in advance. If you don't receive a letter, please call our office to schedule the follow-up appointment.  

## 2015-10-11 ENCOUNTER — Telehealth: Payer: Self-pay | Admitting: Cardiology

## 2015-10-11 ENCOUNTER — Telehealth: Payer: Self-pay | Admitting: *Deleted

## 2015-10-11 NOTE — Telephone Encounter (Signed)
Spoke with pt, medications confirmed.

## 2015-10-11 NOTE — Telephone Encounter (Signed)
Faxed Release signed by patient to [facility] to obtain records per Dr Stanford Breed request.  Faxed on 10/11/15. lp

## 2015-10-17 ENCOUNTER — Telehealth: Payer: Self-pay | Admitting: Cardiology

## 2015-10-17 NOTE — Telephone Encounter (Signed)
Received records from [facility] as requested by Dr Stanford Breed.  Records given to Dr Stanford Breed for review. lp

## 2015-10-18 ENCOUNTER — Telehealth: Payer: Self-pay | Admitting: *Deleted

## 2015-10-18 DIAGNOSIS — I2510 Atherosclerotic heart disease of native coronary artery without angina pectoris: Secondary | ICD-10-CM

## 2015-10-18 NOTE — Telephone Encounter (Addendum)
Records from [facility] reviewed by dr Stanford Breed, exercise nuclear stress test ordered. Pt to increase crestor to 40 mg once daily and have lab work in 4 weeks Left message for pt to call

## 2015-10-20 MED ORDER — ROSUVASTATIN CALCIUM 40 MG PO TABS: 40.0000 mg | ORAL_TABLET | Freq: Every day | ORAL | Status: DC

## 2015-10-20 NOTE — Telephone Encounter (Signed)
Spoke with pt, he has agreed to stress test, [place] will [place] to schedule. Patient voiced understanding of medication change and need for repeat blood work. New script sent to the pharmacy  Lab orders mailed to the pt

## 2015-10-20 NOTE — Telephone Encounter (Signed)
Returning your call. °

## 2015-10-23 NOTE — Telephone Encounter (Signed)
Spoke with pt, he wants to make sure dr Stanford Breed is aware the most recent lab work was on no statin at all. He then started the crestor 20 mg. He has been on the crestor now for about one month and is not sure he would need to increase. He would like to do blood work before increasing to 40 mg to see if the 20 mg dose will work. He also wants to make sure dr Stanford Breed still wants to do a stress test since he had one 3 months ago and his cholesterol numbers are probably better. Will forward for dr Stanford Breed review

## 2015-10-24 ENCOUNTER — Other Ambulatory Visit: Payer: Self-pay | Admitting: *Deleted

## 2015-10-24 ENCOUNTER — Telehealth (HOSPITAL_COMMUNITY): Payer: Self-pay

## 2015-10-24 DIAGNOSIS — I2510 Atherosclerotic heart disease of native coronary artery without angina pectoris: Secondary | ICD-10-CM

## 2015-10-24 MED ORDER — ROSUVASTATIN CALCIUM 40 MG PO TABS: 40.0000 mg | ORAL_TABLET | Freq: Every day | ORAL | 3 refills | Status: DC

## 2015-10-24 NOTE — Telephone Encounter (Signed)
Encounter complete. 

## 2015-10-24 NOTE — Telephone Encounter (Signed)
Had ett, would prefer nuclear study Given high ca score would recommend high dose statin Kirk Ruths

## 2015-10-25 NOTE — Telephone Encounter (Signed)
Spoke with pt, Aware of dr Jacalyn Lefevre recommendations.  He reports that he has discovered that high doses of vit d causes calcium in the blood. He has been taking high doses of vit d for 4 to 5 years now. He is going to stop the vit d and wants to know dr Jacalyn Lefevre thoughts. Nuclear stress test canceled for tomorrow awaiting pre-cert with his insurance for the testing. Will forward for dr Stanford Breed review

## 2015-10-25 NOTE — Telephone Encounter (Signed)
Ca in coronaries not related to vit d or serum ca level Kirk Ruths

## 2015-10-26 ENCOUNTER — Inpatient Hospital Stay (HOSPITAL_COMMUNITY): Admission: RE | Admit: 2015-10-26 | Payer: 59 | Source: Ambulatory Visit

## 2015-10-31 ENCOUNTER — Telehealth: Payer: Self-pay | Admitting: Cardiology

## 2015-10-31 NOTE — Telephone Encounter (Signed)
Stress test was cancelled due to coverage.  Message routed to D. Jones w/pre-cert for follow up

## 2015-10-31 NOTE — Telephone Encounter (Signed)
Pt called follow up on the status of his stress test.    Please give him a call back.

## 2015-11-01 ENCOUNTER — Observation Stay (HOSPITAL_COMMUNITY)
Admission: EM | Admit: 2015-11-01 | Discharge: 2015-11-01 | Discharge status: Against Medical Advice | Payer: 59 | Attending: Internal Medicine | Admitting: Internal Medicine

## 2015-11-01 ENCOUNTER — Emergency Department (HOSPITAL_COMMUNITY): Payer: 59

## 2015-11-01 ENCOUNTER — Encounter (HOSPITAL_COMMUNITY): Payer: Self-pay

## 2015-11-01 DIAGNOSIS — Z7982 Long term (current) use of aspirin: Secondary | ICD-10-CM | POA: Insufficient documentation

## 2015-11-01 DIAGNOSIS — I2510 Atherosclerotic heart disease of native coronary artery without angina pectoris: Secondary | ICD-10-CM | POA: Diagnosis not present

## 2015-11-01 DIAGNOSIS — I10 Essential (primary) hypertension: Secondary | ICD-10-CM | POA: Diagnosis not present

## 2015-11-01 DIAGNOSIS — Z6835 Body mass index (BMI) 35.0-35.9, adult: Secondary | ICD-10-CM | POA: Diagnosis not present

## 2015-11-01 DIAGNOSIS — Z7984 Long term (current) use of oral hypoglycemic drugs: Secondary | ICD-10-CM | POA: Insufficient documentation

## 2015-11-01 DIAGNOSIS — Z79899 Other long term (current) drug therapy: Secondary | ICD-10-CM | POA: Insufficient documentation

## 2015-11-01 DIAGNOSIS — Z9989 Dependence on other enabling machines and devices: Secondary | ICD-10-CM | POA: Diagnosis not present

## 2015-11-01 DIAGNOSIS — E1169 Type 2 diabetes mellitus with other specified complication: Secondary | ICD-10-CM | POA: Diagnosis present

## 2015-11-01 DIAGNOSIS — G4733 Obstructive sleep apnea (adult) (pediatric): Secondary | ICD-10-CM | POA: Diagnosis not present

## 2015-11-01 DIAGNOSIS — E785 Hyperlipidemia, unspecified: Secondary | ICD-10-CM | POA: Insufficient documentation

## 2015-11-01 DIAGNOSIS — K5731 Diverticulosis of large intestine without perforation or abscess with bleeding: Secondary | ICD-10-CM | POA: Diagnosis not present

## 2015-11-01 DIAGNOSIS — R079 Chest pain, unspecified: Secondary | ICD-10-CM | POA: Diagnosis not present

## 2015-11-01 DIAGNOSIS — K922 Gastrointestinal hemorrhage, unspecified: Secondary | ICD-10-CM

## 2015-11-01 DIAGNOSIS — K625 Hemorrhage of anus and rectum: Secondary | ICD-10-CM | POA: Diagnosis present

## 2015-11-01 DIAGNOSIS — K5791 Diverticulosis of intestine, part unspecified, without perforation or abscess with bleeding: Secondary | ICD-10-CM | POA: Diagnosis not present

## 2015-11-01 DIAGNOSIS — R0789 Other chest pain: Secondary | ICD-10-CM

## 2015-11-01 DIAGNOSIS — E669 Obesity, unspecified: Secondary | ICD-10-CM | POA: Diagnosis not present

## 2015-11-01 DIAGNOSIS — E876 Hypokalemia: Secondary | ICD-10-CM | POA: Insufficient documentation

## 2015-11-01 DIAGNOSIS — G4730 Sleep apnea, unspecified: Secondary | ICD-10-CM | POA: Diagnosis present

## 2015-11-01 DIAGNOSIS — R7303 Prediabetes: Secondary | ICD-10-CM | POA: Diagnosis not present

## 2015-11-01 HISTORY — DX: Prediabetes: R73.03

## 2015-11-01 HISTORY — DX: Diverticulitis of intestine, part unspecified, without perforation or abscess without bleeding: K57.92

## 2015-11-01 LAB — COMPREHENSIVE METABOLIC PANEL
ALT: 33 U/L (ref 17–63)
ANION GAP: 8 (ref 5–15)
AST: 34 U/L (ref 15–41)
Albumin: 3.9 g/dL (ref 3.5–5.0)
Alkaline Phosphatase: 40 U/L (ref 38–126)
BUN: 15 mg/dL (ref 6–20)
CHLORIDE: 102 mmol/L (ref 101–111)
CO2: 25 mmol/L (ref 22–32)
CREATININE: 1.09 mg/dL (ref 0.61–1.24)
Calcium: 8.7 mg/dL — ABNORMAL LOW (ref 8.9–10.3)
Glucose, Bld: 139 mg/dL — ABNORMAL HIGH (ref 65–99)
POTASSIUM: 3.3 mmol/L — AB (ref 3.5–5.1)
SODIUM: 135 mmol/L (ref 135–145)
Total Bilirubin: 0.7 mg/dL (ref 0.3–1.2)
Total Protein: 7.3 g/dL (ref 6.5–8.1)

## 2015-11-01 LAB — CBC
HEMATOCRIT: 47.6 % (ref 39.0–52.0)
HEMOGLOBIN: 16.3 g/dL (ref 13.0–17.0)
MCH: 29.5 pg (ref 26.0–34.0)
MCHC: 34.2 g/dL (ref 30.0–36.0)
MCV: 86.2 fL (ref 78.0–100.0)
PLATELETS: 278 10*3/uL (ref 150–400)
RBC: 5.52 MIL/uL (ref 4.22–5.81)
RDW: 14.2 % (ref 11.5–15.5)
WBC: 13.1 10*3/uL — AB (ref 4.0–10.5)

## 2015-11-01 LAB — I-STAT TROPONIN, ED: Troponin i, poc: 0.01 ng/mL (ref 0.00–0.08)

## 2015-11-01 MED ORDER — ASPIRIN EC 81 MG PO TBEC: 81.0000 mg | DELAYED_RELEASE_TABLET | Freq: Every day | ORAL | Status: DC

## 2015-11-01 MED ORDER — MORPHINE SULFATE (PF) 2 MG/ML IV SOLN: 2.0000 mg | INTRAVENOUS | Status: DC | PRN

## 2015-11-01 MED ORDER — GI COCKTAIL ~~LOC~~: 30.0000 mL | Freq: Four times a day (QID) | ORAL | Status: DC | PRN

## 2015-11-01 MED ORDER — ENOXAPARIN SODIUM 40 MG/0.4ML ~~LOC~~ SOLN: 40.0000 mg | Freq: Every day | SUBCUTANEOUS | Status: DC

## 2015-11-01 MED ORDER — ONDANSETRON HCL 4 MG/2ML IJ SOLN: 4.0000 mg | Freq: Four times a day (QID) | INTRAMUSCULAR | Status: DC | PRN

## 2015-11-01 MED ORDER — ROSUVASTATIN CALCIUM 20 MG PO TABS: 40.0000 mg | ORAL_TABLET | Freq: Every day | ORAL | Status: DC

## 2015-11-01 MED ORDER — PEG 3350-KCL-NABCB-NACL-NASULF 236 G PO SOLR
4000.0000 mL | Freq: Once | ORAL | Status: DC
  Filled 2015-11-01: qty 4000

## 2015-11-01 MED ORDER — LACTATED RINGERS IV SOLN: INTRAVENOUS | Status: DC

## 2015-11-01 MED ORDER — ACETAMINOPHEN 325 MG PO TABS: 650.0000 mg | ORAL_TABLET | ORAL | Status: DC | PRN

## 2015-11-01 MED ORDER — AMLODIPINE BESYLATE 5 MG PO TABS: 5.0000 mg | ORAL_TABLET | Freq: Every day | ORAL | Status: DC

## 2015-11-01 MED ORDER — ZOLPIDEM TARTRATE 5 MG PO TABS: 5.0000 mg | ORAL_TABLET | Freq: Every evening | ORAL | Status: DC | PRN

## 2015-11-01 MED ORDER — HYDROCHLOROTHIAZIDE 25 MG PO TABS
25.0000 mg | ORAL_TABLET | Freq: Every day | ORAL | Status: DC
Start: 2015-11-02 — End: 2015-11-02

## 2015-11-01 MED ORDER — INSULIN ASPART 100 UNIT/ML ~~LOC~~ SOLN: 0.0000 [IU] | Freq: Three times a day (TID) | SUBCUTANEOUS | Status: DC

## 2015-11-01 NOTE — ED Notes (Signed)
PA at bedside.

## 2015-11-01 NOTE — Progress Notes (Signed)
Unable to complete patient assessment due to patient wanting to speak with the provider. Provider paged with patient's concerns. Informed that she would be up to speak with the patient.

## 2015-11-01 NOTE — Telephone Encounter (Signed)
Phone Number: [phone]        [facility] PATIENT TO GET HIS NUC APPROVED    PATIENT : [facility]  DOB.1960/07/28  [employer] ID # [id number]  **CASE NUMBER** [id number]  " CASE IN RECONSIDERATION STATUS"  TELEPHONE NUMBER [phone] OPT #3   THANK YOU ALL FOR YOUR HELP IN THIS MATTER.

## 2015-11-01 NOTE — ED Notes (Signed)
Hospitalist at bedside 

## 2015-11-01 NOTE — ED Notes (Signed)
Vonna Kotyk, [place] states he will collect occult blood if needed.

## 2015-11-01 NOTE — ED Provider Notes (Signed)
[facility] DEPT Provider Note   CSN: [id number] Arrival date & time: 11/01/15  1633  First Provider Contact:  None       History   Chief Complaint Chief Complaint  Patient presents with  . Rectal Bleeding  . Chest Pain  . Diarrhea    HPI Oscar [name] is a 55 y.o. male.  Patient with history of diverticular bleeding -- presents with complaint of acute onset of bright red blood starting today. Patient has had several episodes of bloody, poorly formed stool today. No lightheadedness or syncope. Patient states that he finished a ten-day course of Cipro and Flagyl for presumed diverticulitis 4 days ago. In addition, patient has had approximately 3 days of midsternal chest pressure. This is been fairly constant. It is not associated with exertion, diaphoresis, palpitations. Pressure does not radiate. Patient has known coronary artery disease based on previous CT scan (states high calcium score). Patient has undergone aggressive risk factor modification. He has a very strong family history of coronary artery disease and ACS. Patient had a recent stress test and states that he is waiting for a nuclear stress test for further evaluation. The onset of this condition was acute. The course is constant. Aggravating factors: none. Alleviating factors: none.        Past Medical History:  Diagnosis Date  . Chickenpox   . Diverticulitis   . Hepatitis A    as a child, no current liver problems  . Hyperlipidemia   . Hypertension    he also sees Dr. Lynita Lombard in [place], [place]   . Lower GI bleed    Hx: of  . Nephrolithiasis    2-3 kidney stones in past  . Obesity   . Pneumonia   . Sleep apnea 2003   CPAP machine, pt does not know settings    Patient Active Problem List   Diagnosis Date Noted  . Fracture of left distal radius 04/22/2013  . Acute blood loss anemia 12/28/2012  . Diverticular hemorrhage 12/26/2012  . Obesity (BMI 30-39.9) 04/17/2012  . Nausea & vomiting, possible SBO vs  ileus 04/17/2012  . Incisional hernia, recurrent 02/24/2012  . Rectus diastasis 02/24/2012  . BACK PAIN, LUMBAR 11/16/2009  . VIRAL URI 01/02/2009  . HYPERLIPIDEMIA 05/24/2008  . HYPERTENSION 05/24/2008  . ASTHMA 05/24/2008  . SLEEP APNEA 05/24/2008  . NEPHROLITHIASIS, HX OF 05/24/2008    Past Surgical History:  Procedure Laterality Date  . APPENDECTOMY   10-59yrs ago  . CLOSED REDUCTION FINGER WITH PERCUTANEOUS PINNING Left 04/22/2013   Procedure: LEFT WRIST CLOSED REDUCTION CONVERTED TO OPEN REDUCTION INTERNAL FIXATION, LEFT CARPAL TUNNEL RELEASE;  Surgeon: Linna Hoff, MD;  Location: [facility];  Service: Orthopedics;  Laterality: Left;  . COLONOSCOPY  07-09-09   benign polyp repeat 5 years, Dr.Sam Penelope Coop  . HERNIA REPAIR     repaired x 4  . INSERTION OF MESH  04/09/2012   Procedure: INSERTION OF MESH;  Surgeon: Earnstine Regal, MD;  Location: [facility];  Service: General;  Laterality: N/A;  . TONSILLECTOMY  as child  . UVULECTOMY  yrs ago   for snoring  . VENTRAL HERNIA REPAIR  04/09/2012   Procedure: LAPAROSCOPIC VENTRAL HERNIA;  Surgeon: Earnstine Regal, MD;  Location: [facility];  Service: General;  Laterality: N/A;  Laparoscopic Ventral Incisional Hernia Repair with Mesh       Home Medications    Prior to Admission medications   Medication Sig Start Date End Date Taking? Authorizing Provider  amLODipine (  NORVASC) 5 MG tablet Take 5 mg by mouth daily.    Historical Provider, MD  aspirin 81 MG tablet Take 81 mg by mouth daily.    Historical Provider, MD  B Complex Vitamins (B COMPLEX-B12 PO) Take by mouth daily.    Historical Provider, MD  cholecalciferol (VITAMIN D) 1000 UNITS tablet Take 5,000 Units by mouth every evening.    Historical Provider, MD  Coenzyme Q10 (COQ10 PO) Take 1 tablet by mouth daily.    Historical Provider, MD  DHEA 25 MG CAPS Take 25 mg by mouth every morning.     Historical Provider, MD  hydrochlorothiazide (HYDRODIURIL) 25 MG tablet Take 25 mg by mouth every  morning.    Historical Provider, MD  magnesium gluconate (MAGONATE) 500 MG tablet Take 500 mg by mouth daily.    Historical Provider, MD  metFORMIN (GLUCOPHAGE) 500 MG tablet Take 500 mg by mouth. Take 500 mg in the AM and 1000 mg in the PM. 10/04/14   Historical Provider, MD  niacin 500 MG tablet Take 500 mg by mouth at bedtime.     Historical Provider, MD  Omega-3 Fatty Acids (FISH OIL PO) Take 1 capsule by mouth daily.    Historical Provider, MD  Probiotic Product (PROBIOTIC FORMULA PO) Take 1 tablet by mouth 2 (two) times daily.     Historical Provider, MD  Psyllium (METAMUCIL FIBER PO) Take by mouth 2 (two) times daily.    Historical Provider, MD  rosuvastatin (CRESTOR) 40 MG tablet Take 1 tablet (40 mg total) by mouth daily. 10/24/15   Lelon Perla, MD  vitamin C (ASCORBIC ACID) 500 MG tablet Take 1 tablet (500 mg total) by mouth daily. Patient taking differently: Take 500 mg by mouth 2 (two) times daily.  04/22/13   Iran Planas, MD  zolpidem (AMBIEN) 10 MG tablet Take 10 mg by mouth at bedtime as needed for sleep.  01/28/13   Historical Provider, MD    Family History Family History  Problem Relation Age of Onset  . Stroke Mother   . CAD Mother     MI in her 48s  . Diabetes Brother   . Hypertension Brother   . Obesity Brother     Social History Social History  Substance Use Topics  . Smoking status: Never Smoker  . Smokeless tobacco: Never Used  . Alcohol use 0.0 oz/week     Comment: Once a week-wine     Allergies   Norvasc [amlodipine besylate]   Review of Systems Review of Systems  Constitutional: Negative for diaphoresis and fever.  HENT: Negative for rhinorrhea and sore throat.   Eyes: Negative for redness.  Respiratory: Negative for cough and shortness of breath.   Cardiovascular: Positive for chest pain. Negative for palpitations and leg swelling.  Gastrointestinal: Positive for blood in stool. Negative for abdominal pain, diarrhea, nausea and vomiting.    Genitourinary: Negative for dysuria.  Musculoskeletal: Negative for back pain, myalgias and neck pain.  Skin: Negative for rash.  Neurological: Negative for syncope, light-headedness and headaches.  Psychiatric/Behavioral: The patient is not nervous/anxious.      Physical Exam Updated Vital Signs BP (!) 150/102 (BP Location: Left Arm)   Pulse 101   Temp 98 F (36.7 C) (Oral)   Resp 17   Ht 6\' 2"  (1.88 m)   Wt 118.8 kg   SpO2 94%   BMI 33.64 kg/m   Physical Exam  Constitutional: He appears well-developed and well-nourished.  HENT:  Head: Normocephalic and  atraumatic.  Mouth/Throat: Mucous membranes are normal. Mucous membranes are not dry.  Eyes: Conjunctivae are normal. Right eye exhibits no discharge. Left eye exhibits no discharge.  No conjunctival pallor.   Neck: Trachea normal and normal range of motion. Neck supple. Normal carotid pulses and no JVD present. No muscular tenderness present. Carotid bruit is not present. No tracheal deviation present.  Cardiovascular: Normal rate, regular rhythm, S1 normal, S2 normal, normal heart sounds and intact distal pulses.  Exam reveals no distant heart sounds and no decreased pulses.   No murmur heard. No tachycardia.   Pulmonary/Chest: Effort normal and breath sounds normal. No respiratory distress. He has no wheezes. He exhibits no tenderness.  Abdominal: Soft. Normal aorta and bowel sounds are normal. There is no tenderness. There is no rebound and no guarding.  Musculoskeletal: He exhibits no edema.  Neurological: He is alert.  Skin: Skin is warm and dry. He is not diaphoretic. No cyanosis. No pallor.  Psychiatric: He has a normal mood and affect.  Nursing note and vitals reviewed.    ED Treatments / Results  Labs (all labs ordered are listed, but only abnormal results are displayed) Labs Reviewed  COMPREHENSIVE METABOLIC PANEL - Abnormal; Notable for the following:       Result Value   Potassium 3.3 (*)    Glucose,  Bld 139 (*)    Calcium 8.7 (*)    All other components within normal limits  CBC - Abnormal; Notable for the following:    WBC 13.1 (*)    All other components within normal limits  HEMOGLOBIN A1C  TROPONIN I  TROPONIN I  TROPONIN I  CBC  CBC  CBC  I-STAT TROPOININ, ED    EKG  EKG Interpretation  Date/Time:  Wednesday November 01 2015 18:48:22 EDT Ventricular Rate:  99 PR Interval:    QRS Duration: 102 QT Interval:  358 QTC Calculation: 460 R Axis:   -58 Text Interpretation:  Sinus rhythm Left anterior fascicular block Abnormal R-wave progression, late transition agree. no change from previous Confirmed by Johnney Killian, MD, Jeannie Done [phone]) on 11/01/2015 6:52:05 PM       Radiology Dg Chest 2 View  Result Date: 11/01/2015 CLINICAL DATA:  Chest pain. EXAM: CHEST  2 VIEW COMPARISON:  Radiographs of May 04, 2014. FINDINGS: The heart size and mediastinal contours are within normal limits. Both lungs are clear. No pneumothorax or pleural effusion is noted. The visualized skeletal structures are unremarkable. IMPRESSION: No active cardiopulmonary disease. Electronically Signed   By: Marijo Conception, M.D.   On: 11/01/2015 18:11    Procedures Procedures (including critical care time)  Medications Ordered in ED Medications  aspirin EC tablet 81 mg (not administered)  zolpidem (AMBIEN) tablet 5 mg (not administered)  rosuvastatin (CRESTOR) tablet 40 mg (not administered)  amLODipine (NORVASC) tablet 5 mg (not administered)  hydrochlorothiazide (HYDRODIURIL) tablet 25 mg (not administered)  insulin aspart (novoLOG) injection 0-15 Units (not administered)  enoxaparin (LOVENOX) injection 40 mg (not administered)  morphine 2 MG/ML injection 2 mg (not administered)  gi cocktail (Maalox,Lidocaine,Donnatal) (not administered)  acetaminophen (TYLENOL) tablet 650 mg (not administered)  ondansetron (ZOFRAN) injection 4 mg (not administered)  polyethylene glycol (GoLYTELY/NuLYTELY) solution  4,000 mL (not administered)  lactated ringers infusion (not administered)     Initial Impression / Assessment and Plan / ED Course  I have reviewed the triage vital signs and the nursing notes.  Pertinent labs & imaging results that were available during my care of the  patient were reviewed by me and considered in my medical decision making (see chart for details).  Clinical Course   Patient seen and examined. EKG reviewed. Given continued GI bleed, chest pain with risk factors, feel that admission is indicated on these grounds.  Discussed patient with Dr. Tamera Punt.   Patient updated and agrees with plan.  Discussed with Dr. Lorin Mercy, [facility] who will see patient and admit.   Final Clinical Impressions(s) / ED Diagnoses   Final diagnoses:  Lower GI bleed  Chest pain, unspecified chest pain type   Patient with lower GI bleed, hemodynamically stable, admit for monitoring given ongoing bleeding. Also chest pain in setting of known coronary artery disease.  New Prescriptions New Prescriptions   No medications on file     Carlisle Cater, PA-C 11/01/15 [place], MD 11/01/15 2312

## 2015-11-01 NOTE — Progress Notes (Signed)
Patient spoke with provider about admission concerns. He elected to leave AMA. Paperwork signed and patient left the facility.

## 2015-11-01 NOTE — ED Triage Notes (Signed)
Pt c/o generalized chest heaviness x 4-5 days and bright red, rectal bleed and diarrhea starting this morning.  Pain score 2/10.  Hx of diverticulitis.  Pt reports recent flair and completed antibiotic x 4 days ago.  Pt c/o lightheadedness, but believes it is due to the bleed and not cardiac.  Denies other associated cardiac complaints.

## 2015-11-01 NOTE — ED Notes (Signed)
Patient transported to X-ray 

## 2015-11-01 NOTE — ED Notes (Signed)
Patient not in room. Unable to assess.

## 2015-11-01 NOTE — Telephone Encounter (Signed)
Follow-up      The pt is calling to speak with Hilda Blades

## 2015-11-01 NOTE — ED Notes (Signed)
RN at bedside starting IV will collect labs 

## 2015-11-01 NOTE — H&P (Signed)
History and Physical    Oscar [name] [id number] DOB: 09-May-1960 DOA: 11/01/2015  PCP: Laurey Morale, MD Consultants:  Sydnee Cabal - cardiology; [facility] - executive medicine; Stephanie Coup - health monitoring doctor (supplements) Patient coming from: home - lives with wife and 2 children  Chief Complaint: rectal bleeding  HPI: Oscar [name] is a 55 y.o. male with medical history significant of diverticular bleeding as well as HTN/HLD presenting with BRBPR.  This AM, awoke with rectal bleeding.  Significant blood in toilet.  7-8 bloody episodes today.  Most recent episode in the ER was about 1/2 as much blood so he thinks it is starting to improve now.  Patient also reports that he and his wife were recently on vacation and he got diverticulitis attack - started taking Cipro/Flagyl and completed 10 day course finishing up last Saturday.    Patient also complains of chest pressure.  It started 4-5 days ago with uncomfortable chest pressure across diffuse chest.  Chest discomfort "kind of centers".   No associated SOB.  Non-exertional, nothing makes better/worse.  A little more SOB with exercise than previously but thinks that is because he has been doing less cardio than prior.  He does report having a visit at [facility] about 4 months ago for executive work up.  CT/calcium scoring/carotid arteries/full body MRI to assess joints with special attention to abdomen for multiple prior abdominal surgeries.  10ya the plaque score was 11%, now 95% despite exercising more and eating better.  Did have stress test at [facility], stopped at 7-8 minutes because he was doing well, by his report.  They recommended he f/u with local cardiologist due to his high calcium score and has seen Dr. Stanford Breed once and he recommended nuclear stress test (has not happened yet).  He increased Crestor from 10 to 40 mg.  Has horrible HDL, usually high of 32.  Overall cholesterol 80-150.  Takes Metformin for  pre-diabetes (last A1c 5.9. Highest ever was 6.2).  Has OSA and wears CPAP.   ED Course:  Normal hgb and troponin/EKG but GI bleeding and chest pressure so called hospitalist for admission  Review of Systems: As per HPI; otherwise 10 point review of systems reviewed and negative.   Ambulatory Status:  Ambulates independently  Past Medical History:  Diagnosis Date  . Chickenpox   . Diverticulitis   . Hepatitis A    as a child, no current liver problems  . Hyperlipidemia   . Hypertension    he also sees Dr. Lynita Lombard in [place], [place]   . Lower GI bleed    Hx: of  . Nephrolithiasis    2-3 kidney stones in past  . Obesity   . Pneumonia   . Prediabetes   . Sleep apnea 2003   CPAP machine, pt does not know settings    Past Surgical History:  Procedure Laterality Date  . APPENDECTOMY   10-52yrs ago  . CLOSED REDUCTION FINGER WITH PERCUTANEOUS PINNING Left 04/22/2013   Procedure: LEFT WRIST CLOSED REDUCTION CONVERTED TO OPEN REDUCTION INTERNAL FIXATION, LEFT CARPAL TUNNEL RELEASE;  Surgeon: Linna Hoff, MD;  Location: [facility];  Service: Orthopedics;  Laterality: Left;  . COLONOSCOPY  07-09-09   benign polyp repeat 5 years, Dr.Sam Penelope Coop  . HERNIA REPAIR     repaired x 4  . INSERTION OF MESH  04/09/2012   Procedure: INSERTION OF MESH;  Surgeon: Earnstine Regal, MD;  Location: [facility];  Service: General;  Laterality: N/A;  .  TONSILLECTOMY  as child  . UVULECTOMY  yrs ago   for snoring  . VENTRAL HERNIA REPAIR  04/09/2012   Procedure: LAPAROSCOPIC VENTRAL HERNIA;  Surgeon: Earnstine Regal, MD;  Location: [facility];  Service: General;  Laterality: N/A;  Laparoscopic Ventral Incisional Hernia Repair with Mesh    Social History   Social History  . Marital status: Married    Spouse name: Baxter Flattery  . Number of children: 4  . Years of education: 7   Occupational History  . Self employed: [employer]   Social History Main Topics  . Smoking status: Never  Smoker  . Smokeless tobacco: Never Used  . Alcohol use 0.0 oz/week     Comment: 2-3 glasses 2-3 times a week-wine; occasional liquior drinks  . Drug use: No  . Sexual activity: No   Other Topics Concern  . Not on file   Social History Narrative   Married.  Lives with wife.  Independent of ADLs.    Allergies  Allergen Reactions  . Advair Diskus [Fluticasone-Salmeterol] Cough    Family History  Problem Relation Age of Onset  . Stroke Mother     late 9s  . CAD Mother     MI in her 34s  . CAD Father 78  . Marfan syndrome Brother   . Diabetes Brother   . Hypertension Brother   . Obesity Brother     Prior to Admission medications   Medication Sig Start Date End Date Taking? Authorizing Provider  acidophilus (RISAQUAD) CAPS capsule Take 1 capsule by mouth 2 (two) times daily.   Yes Historical Provider, MD  amLODipine (NORVASC) 5 MG tablet Take 5 mg by mouth daily.   Yes Historical Provider, MD  aspirin EC 81 MG tablet Take 81 mg by mouth daily.   Yes Historical Provider, MD  B Complex-C (B-COMPLEX WITH VITAMIN C) tablet Take 1 tablet by mouth daily.   Yes Historical Provider, MD  co-enzyme Q-10 30 MG capsule Take 30 mg by mouth daily.   Yes Historical Provider, MD  DHEA 50 MG CAPS Take 50 mg by mouth daily.   Yes Historical Provider, MD  hydrochlorothiazide (HYDRODIURIL) 25 MG tablet Take 25 mg by mouth daily.    Yes Historical Provider, MD  magnesium oxide (MAG-OX) 400 (241.3 Mg) MG tablet Take 400 mg by mouth at bedtime.   Yes Historical Provider, MD  metFORMIN (GLUCOPHAGE) 500 MG tablet Take 500-1,000 mg by mouth 2 (two) times daily. Pt takes one tablet in the morning and two at night.   Yes Historical Provider, MD  niacin 500 MG tablet Take 500 mg by mouth at bedtime.    Yes Historical Provider, MD  omega-3 acid ethyl esters (LOVAZA) 1 g capsule Take 1 g by mouth daily.   Yes Historical Provider, MD  psyllium (REGULOID) 0.52 g capsule Take 1.04 g by mouth 2 (two) times  daily.   Yes Historical Provider, MD  rosuvastatin (CRESTOR) 40 MG tablet Take 1 tablet (40 mg total) by mouth daily. 10/24/15  Yes Lelon Perla, MD  vitamin C (ASCORBIC ACID) 500 MG tablet Take 1,000 mg by mouth 2 (two) times daily.   Yes Historical Provider, MD  zolpidem (AMBIEN) 5 MG tablet Take 5 mg by mouth at bedtime as needed for sleep.   Yes Historical Provider, MD    Physical Exam: Vitals:   11/01/15 1811 11/01/15 1822 11/01/15 1830 11/01/15 2050  BP: 137/92  139/84 156/85  Pulse: 101 101 100 100  Resp: 21 24 22 13   Temp:    98 F (36.7 C)  TempSrc:    Oral  SpO2: 93% 95% 93% 98%  Weight:      Height:         General: Appears calm and comfortable and is NAD; sitting up in bedside chair and is reluctant to commit to staying in hospital, requesting completion of evaluation ASAP Eyes:  PERRL, EOMI, normal lids, iris ENT:  grossly normal hearing, lips & tongue, mmm Neck:  no LAD, masses or thyromegaly Cardiovascular:  RRR, no m/r/g. No LE edema.  Respiratory:  CTA bilaterally, no w/r/r. Normal respiratory effort. Abdomen:  soft, ntnd, NABS Skin:  no rash or induration seen on limited exam Musculoskeletal:  grossly normal tone BUE/BLE, good ROM, no bony abnormality Psychiatric:  grossly normal mood and affect, speech fluent and appropriate, AOx3 Neurologic:  CN 2-12 grossly intact, moves all extremities in coordinated fashion, sensation intact  Labs on Admission: I have personally reviewed following labs and imaging studies  CBC:  Recent Labs Lab 11/01/15 1741  WBC 13.1*  HGB 16.3  HCT 47.6  MCV 86.2  PLT 0000000   Basic Metabolic Panel:  Recent Labs Lab 11/01/15 1741  NA 135  K 3.3*  CL 102  CO2 25  GLUCOSE 139*  BUN 15  CREATININE 1.09  CALCIUM 8.7*   GFR: Estimated Creatinine Clearance: 104.8 mL/min (by C-G formula based on SCr of 1.09 mg/dL). Liver Function Tests:  Recent Labs Lab 11/01/15 1741  AST 34  ALT 33  ALKPHOS 40  BILITOT 0.7    PROT 7.3  ALBUMIN 3.9   No results for input(s): LIPASE, AMYLASE in the last 168 hours. No results for input(s): AMMONIA in the last 168 hours. Coagulation Profile: No results for input(s): INR, PROTIME in the last 168 hours. Cardiac Enzymes: No results for input(s): CKTOTAL, CKMB, CKMBINDEX, TROPONINI in the last 168 hours. BNP (last 3 results) No results for input(s): PROBNP in the last 8760 hours. HbA1C: No results for input(s): HGBA1C in the last 72 hours. CBG: No results for input(s): GLUCAP in the last 168 hours. Lipid Profile: No results for input(s): CHOL, HDL, LDLCALC, TRIG, CHOLHDL, LDLDIRECT in the last 72 hours. Thyroid Function Tests: No results for input(s): TSH, T4TOTAL, FREET4, T3FREE, THYROIDAB in the last 72 hours. Anemia Panel: No results for input(s): VITAMINB12, FOLATE, FERRITIN, TIBC, IRON, RETICCTPCT in the last 72 hours. Urine analysis:    Component Value Date/Time   BILIRUBINUR n 06/18/2011 0907   PROTEINUR trace 06/18/2011 0907   UROBILINOGEN 0.2 06/18/2011 0907   NITRITE n 06/18/2011 0907   LEUKOCYTESUR Negative 06/18/2011 0907    Creatinine Clearance: Estimated Creatinine Clearance: 104.8 mL/min (by C-G formula based on SCr of 1.09 mg/dL).  Sepsis Labs: @LABRCNTIP (procalcitonin:4,lacticidven:4) )No results found for this or any previous visit (from the past 240 hour(s)).   Radiological Exams on Admission: Dg Chest 2 View  Result Date: 11/01/2015 CLINICAL DATA:  Chest pain. EXAM: CHEST  2 VIEW COMPARISON:  Radiographs of May 04, 2014. FINDINGS: The heart size and mediastinal contours are within normal limits. Both lungs are clear. No pneumothorax or pleural effusion is noted. The visualized skeletal structures are unremarkable. IMPRESSION: No active cardiopulmonary disease. Electronically Signed   By: Marijo Conception, M.D.   On: 11/01/2015 18:11    EKG: Independently reviewed.  NSR with rate 99; nonspecific ST changes with no evidence of  acute ischemia; NSCSLT  Assessment/Plan Principal Problem:   Diverticular hemorrhage Active Problems:   Hyperlipidemia   Essential hypertension   Sleep apnea   Obesity (BMI 30-39.9)   Chest pain   Hypokalemia    Diverticular hemorrhage -Patient with h/o lower GI bleeding admitted 12/16/12 -Place in observation status with telemetry -Serial CBC q6h -2 large bore IVs -Continue ASA due to chest pain -GI consult with Dr. Jessie Foot GI - spoke with Dr. Cristina Gong and he will pass along the information to his rounding colleague in the AM -Clear liquids until MN and then NPO in case of plan for C-scope -Bowel prep overnight in case of need for c-scope tomorrow  Chest pain -Atypical by description - substernal in nature but not exertional and relieved spontaneously -Multiple CVD RF including impressive FH as well as extremely high self-reported calcium score -Fairly recent negative GXT (4/17) but given symptoms it is reasonable to perform nuclear medicine stress test for further information -Placed in obs status with telemetry as above -Continue daily ASA despite GI bleed -Negative EKG and troponin thus far; will trend troponins and repeat EKG in AM -Cardiology consult requested; message sent to [facility] via Inbox Message  HLD -Crestor dose recently increased by Dr. Stanford Breed from 10 mg daily to 40 mg daily -Continue Crestor 40 mg daily -Repeat FLP  HTN -Suboptimal control thus far tonight -Reports "severe white coat hypertension" and that he has improved control at home -Continue Norvasc, HCTZ -Consider ambulatory BP monitoring as an outpatient  OSA -Continue home CPAP vs. Our autopap - patient preference  Obesity -Hypergylcemia on presentation -Reports h/o "prediabetes" -Will order accuchecks and sliding scale insulin for now -Recheck A1c  Hypokalemia -Will replete and recheck in AM   DVT prophylaxis: Low risk, ambulate patient; active GI bleed so no  Lovenox and should not need SCDs Code Status: Full - confirmed with patient Family Communication: Not present Disposition Plan: Home once clinically improved Consults called: GI; Cardiology Admission status: Observation, telemetry    Karmen Bongo MD [facility]  If 7PM-7AM, please contact night-coverage [email or web address] Password [email or web address]  11/01/2015, 10:07 PM

## 2015-11-01 NOTE — Telephone Encounter (Signed)
Given previously negative ETT will cancel nuc as will not be covered Kirk [facility]

## 2015-11-01 NOTE — Telephone Encounter (Signed)
New message       Calling to talk to the nurse regarding getting a peer- to-peer appt set up so that the pt can get his stress test approved.  Please call

## 2015-11-01 NOTE — Progress Notes (Signed)
Patient requested to see me.  I came to see the patient.  He is not satisfied with the plan for admission and prefers to have his evaluation done as an outpatient.  I explained his medical problems; my recommended treatment plan; and the potential ramifications associated with leaving the hospital against medical advice.  He prefers to sign AMA paperwork and be discharged at this time.  Carlyon Shadow, M.D.

## 2015-11-01 NOTE — ED Notes (Addendum)
Attempted IV insertion; attempt was unsuccessful. 2nd RN to bedside to attempt IV insertion/lab draw.

## 2015-11-02 ENCOUNTER — Encounter: Payer: Self-pay | Admitting: *Deleted

## 2015-11-02 NOTE — Telephone Encounter (Signed)
Left message for Oscar [name] to call.

## 2015-11-02 NOTE — Telephone Encounter (Signed)
Oscar [name] ( Broker's office is wanting to see if she can get a Tax inspector to [employer] can be done today. Please call at [phone]

## 2015-11-02 NOTE — Telephone Encounter (Signed)
Spoke with pt, he reports he was seen in the ER last night for chest tightness. They wanted to do an inpatient nuclear test but he preferred to have it done on an outpatient basis. Spoke with the pre-cert departmant, they will reopen his case.

## 2015-11-03 NOTE — Telephone Encounter (Signed)
Spoke with Oscar [name], aware we are working on [employer].

## 2015-11-03 NOTE — Telephone Encounter (Signed)
Follow-up      The broker office is returning Oscar [name]'s call

## 2015-11-10 ENCOUNTER — Encounter: Payer: Self-pay | Admitting: Cardiology

## 2015-11-14 NOTE — Telephone Encounter (Signed)
Spoke with pt, aware stress test is still in appeals process with his insurance. Will let him know as soon as we find out the outcome.

## 2015-11-20 ENCOUNTER — Ambulatory Visit
Admission: RE | Admit: 2015-11-20 | Discharge: 2015-11-20 | Disposition: A | Discharge status: Home / Self Care | Payer: 59 | Source: Ambulatory Visit | Attending: Family Medicine | Admitting: Family Medicine

## 2015-11-20 ENCOUNTER — Other Ambulatory Visit: Payer: Self-pay | Admitting: Family Medicine

## 2015-11-20 DIAGNOSIS — R1030 Lower abdominal pain, unspecified: Secondary | ICD-10-CM

## 2015-11-20 DIAGNOSIS — R109 Unspecified abdominal pain: Secondary | ICD-10-CM

## 2015-11-20 MED ORDER — IOPAMIDOL (ISOVUE-300) INJECTION 61%
125.0000 mL | Freq: Once | INTRAVENOUS | Status: AC | PRN
  Administered 2015-11-20: 125 mL via INTRAVENOUS

## 2015-12-19 ENCOUNTER — Telehealth: Payer: Self-pay | Admitting: *Deleted

## 2015-12-19 NOTE — Telephone Encounter (Signed)
Left message for pt to call, his nuclear stress test has been approved through 01-19-2016. Ask pt to call to schedule or let me know if he wants to do testing at this time.

## 2016-01-11 DIAGNOSIS — Z8249 Family history of ischemic heart disease and other diseases of the circulatory system: Secondary | ICD-10-CM | POA: Insufficient documentation

## 2016-01-11 DIAGNOSIS — I25119 Atherosclerotic heart disease of native coronary artery with unspecified angina pectoris: Secondary | ICD-10-CM | POA: Insufficient documentation

## 2016-03-21 ENCOUNTER — Encounter: Payer: Self-pay | Admitting: Family Medicine

## 2016-04-10 ENCOUNTER — Telehealth: Payer: Self-pay | Admitting: Internal Medicine

## 2016-04-10 NOTE — Telephone Encounter (Signed)
A user error has taken place.

## 2016-04-11 DIAGNOSIS — R1084 Generalized abdominal pain: Secondary | ICD-10-CM | POA: Diagnosis not present

## 2016-04-11 DIAGNOSIS — R1032 Left lower quadrant pain: Secondary | ICD-10-CM | POA: Diagnosis not present

## 2016-04-15 ENCOUNTER — Ambulatory Visit (INDEPENDENT_AMBULATORY_CARE_PROVIDER_SITE_OTHER): Payer: 59 | Admitting: Internal Medicine

## 2016-04-15 ENCOUNTER — Encounter: Payer: Self-pay | Admitting: Internal Medicine

## 2016-04-15 VITALS — BP 132/88 | HR 100 | Ht 71.75 in | Wt 278.0 lb

## 2016-04-15 DIAGNOSIS — K921 Melena: Secondary | ICD-10-CM | POA: Diagnosis not present

## 2016-04-15 DIAGNOSIS — K5731 Diverticulosis of large intestine without perforation or abscess with bleeding: Secondary | ICD-10-CM | POA: Diagnosis not present

## 2016-04-15 DIAGNOSIS — Z86010 Personal history of colonic polyps: Secondary | ICD-10-CM

## 2016-04-15 DIAGNOSIS — Z8719 Personal history of other diseases of the digestive system: Secondary | ICD-10-CM

## 2016-04-15 NOTE — Patient Instructions (Signed)
  You have been scheduled for a colonoscopy. Please follow written instructions given to you at your visit today.  Please pick up your prep supplies at the pharmacy. If you use inhalers (even only as needed), please bring them with you on the day of your procedure. Your physician has requested that you go to [email or web address] and enter the access code given to you at your visit today. This web site gives a general overview about your procedure. However, you should still follow specific instructions given to you by our office regarding your preparation for the procedure.     I appreciate the opportunity to care for you. Carl Gessner, MD, FACG 

## 2016-04-15 NOTE — Progress Notes (Signed)
Oscar [name] 56 y.o. 04-13-1960 [id number] Referred by: Dr. Derinda Late Assessment & Plan:   Encounter Diagnoses  Name Primary?  . Hematochezia Yes  . Diverticulosis of large intestine with hemorrhage   . History of diverticulitis of colon   . Hx of colonic polyps    Multiple episodes of GI bleeding that sound like recurrent diverticular hemorrhage and he has also had problems with diverticulitis - proven by CT but at times may have had symptomatic diverticulosis or other causes of abdominal pain treated as diverticulitis.  He is dissatisfied with approach of assuming he has had diverticulosis with hemorrhage as is standard of care many times.  It is possible he has other sources of bleeding such as angiodysplasia, neoplasia though I suspect he has had diverticulosis and hemorrhage at least some of the time.  His most recent bleeding episode seems over so it is ok to resume ASA  Colonoscopy will be scheduled to reassess his situation - he is aware that if I see angiodysplasia would need a second colonoscopy to treat that at hospital since this one will be done in [place].  The risks and benefits as well as alternatives of endoscopic procedure(s) have been discussed and reviewed. All questions answered. The patient agrees to proceed.  He is probably not a good candidate for elective resection of diverticulosis due to multiple prior abdominal hernia repairs and mesh.  I appreciate the opportunity to care for this patient. [id number] F, MD   Subjective:   Chief Complaint: GI bleeding, diverticulosis/itis, anemia  HPI Very nice 56 yo married wm with a hx of recurrent GI bleeding attributed to diverticulosis and also recurrent diverticulitis. Most recent problems with passage of bright red and maroon stools late December - Hgb 10.2 12/26 and then up to 12.7 04/11/16 and no more bleeding since late Dec. Also had ED visit with similar issues in Aug 2017.  That occurred after taking cipro and metronidazole for suspected diverticulitis. Hgb was 16 then - he was to be admitted but declined.   CT abd/pelvis around that time after showed extensive colonic diverticulosis but no diverticulitis, r nephrolithiasis, coronary artery and aortic atherosclerosis. Images viewed w/ patient.  In 2014 he was admitted with hematochezia and developed anemia and was observed w/o colonoscopy due to prior colonoscopy [facility]) showing diverticulosis. 2013 last colonoscopy showed left diverticulosis and hemorrhoids.2005 diminutive adenoma, 2010 inflamed cecal polyp not adenoma.  Diverticulitis problems began 2013 05/2011 CT showing sigmoid diverticulitis. He has taken Abx several x since for similar sxs - He goes to [employer] [facility] for executive health screenings and 07/2015 had mild diverticulitis on CT Tx Augmentin, was w/o sxs, then July was Tx cipro metronidazole before the ED trip mentioned above - he had LLQ pain, weakness and chills while travelling.  Also has had hernia repairs x 5 - last by Dr. Harlow Asa in 2014 and he had an ileus after that.  Also has taken 2 probiotics for chronic bloating and diarrhea which are much better on those but still has bloating and is on metformin. Also has taken supplemental testosterone and DHEA with high serum testosterone levels.  Allergies  Allergen Reactions  . Advair Diskus [Fluticasone-Salmeterol] Cough   Outpatient Medications Prior to Visit  Medication Sig Dispense Refill  . acidophilus (RISAQUAD) CAPS capsule Take 1 capsule by mouth 2 (two) times daily.    Marland Kitchen amLODipine (NORVASC) 5 MG tablet Take 5 mg by mouth daily.    . B Complex-C (B-COMPLEX WITH  VITAMIN C) tablet Take 1 tablet by mouth daily.    Marland Kitchen DHEA 50 MG CAPS Take 50 mg by mouth daily.    . hydrochlorothiazide (HYDRODIURIL) 25 MG tablet Take 25 mg by mouth daily.     . metFORMIN (GLUCOPHAGE) 500 MG tablet Take 500-1,000 mg by mouth 2 (two) times daily. Pt takes one  tablet in the morning and two at night.    . rosuvastatin (CRESTOR) 40 MG tablet Take 1 tablet (40 mg total) by mouth daily. 90 tablet 3  . vitamin C (ASCORBIC ACID) 500 MG tablet Take 1,000 mg by mouth 2 (two) times daily.    Marland Kitchen zolpidem (AMBIEN) 5 MG tablet Take 5 mg by mouth at bedtime as needed for sleep.    Marland Kitchen aspirin EC 81 MG tablet Take 81 mg by mouth daily.    Marland Kitchen co-enzyme Q-10 30 MG capsule Take 30 mg by mouth daily.    . magnesium oxide (MAG-OX) 400 (241.3 Mg) MG tablet Take 400 mg by mouth at bedtime.    . niacin 500 MG tablet Take 500 mg by mouth at bedtime.     Marland Kitchen omega-3 acid ethyl esters (LOVAZA) 1 g capsule Take 1 g by mouth daily.    . psyllium (REGULOID) 0.52 g capsule Take 1.04 g by mouth 2 (two) times daily.     No facility-administered medications prior to visit.    Past Medical History:  Diagnosis Date  . Adenomatous polyp   . Chickenpox   . Diverticulitis   . Hepatitis A    as a child, no current liver problems  . Hyperlipidemia   . Hypertension    he also sees Dr. Lynita Lombard in [place], [place]   . Lower GI bleed    Hx: of  . Nephrolithiasis    2-3 kidney stones in past  . Obesity   . Pneumonia   . Prediabetes   . Sleep apnea 2003   CPAP machine, pt does not know settings   Past Surgical History:  Procedure Laterality Date  . APPENDECTOMY   10-31yrs ago  . CLOSED REDUCTION FINGER WITH PERCUTANEOUS PINNING Left 04/22/2013   Procedure: LEFT WRIST CLOSED REDUCTION CONVERTED TO OPEN REDUCTION INTERNAL FIXATION, LEFT CARPAL TUNNEL RELEASE;  Surgeon: Linna Hoff, MD;  Location: [facility];  Service: Orthopedics;  Laterality: Left;  . COLONOSCOPY  07-09-09   benign polyp repeat 5 years, Dr.Sam [facility]  . HERNIA REPAIR     repaired x 4  . INSERTION OF MESH  04/09/2012   Procedure: INSERTION OF MESH;  Surgeon: Earnstine Regal, MD;  Location: [facility];  Service: General;  Laterality: N/A;  . TONSILLECTOMY  as child  . UVULECTOMY  yrs ago   for snoring  . VENTRAL HERNIA REPAIR   04/09/2012   Procedure: LAPAROSCOPIC VENTRAL HERNIA;  Surgeon: Earnstine Regal, MD;  Location: [facility];  Service: General;  Laterality: N/A;  Laparoscopic Ventral Incisional Hernia Repair with Mesh   Social History   Social History  . Marital status: Married    Spouse name: Baxter Flattery  . Number of children: 4  . Years of education: 72   Occupational History  . 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 Smoking status: Never Smoker  . Smokeless tobacco: Never Used  . Alcohol use 0.0 oz/week     Comment: 2-3 glasses 2-3 times a week-wine; occasional liquior drinks  . Drug use: No  .  Sexual activity: No   Other Topics Concern  . None   Social History Narrative   Married.  Lives with wife.  Independent of ADLs.   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 Stroke Mother     late 34s  . CAD Mother     MI in her 66s  . CAD Father 51  . Marfan syndrome Brother   . Diabetes Brother   . Hypertension Brother   . Obesity Brother   . Colon cancer Neg Hx     Current Meds  Medication Sig  . acidophilus (RISAQUAD) CAPS capsule Take 1 capsule by mouth 2 (two) times daily.  Marland Kitchen amLODipine (NORVASC) 5 MG tablet Take 5 mg by mouth daily.  . B Complex-C (B-COMPLEX WITH VITAMIN C) tablet Take 1 tablet by mouth daily.  Marland Kitchen DHEA 50 MG CAPS Take 50 mg by mouth daily.  . hydrochlorothiazide (HYDRODIURIL) 25 MG tablet Take 25 mg by mouth daily.   . metFORMIN (GLUCOPHAGE) 500 MG tablet Take 500-1,000 mg by mouth 2 (two) times daily. Pt takes one tablet in the morning and two at night.  . rosuvastatin (CRESTOR) 40 MG tablet Take 1 tablet (40 mg total) by mouth daily.  . vitamin C (ASCORBIC ACID) 500 MG tablet Take 1,000 mg by mouth 2 (two) times daily.  Marland Kitchen zolpidem (AMBIEN) 5 MG tablet Take 5 mg by mouth at bedtime as needed for sleep.   Allergies  Allergen Reactions  . Advair Diskus  [Fluticasone-Salmeterol] Cough   Past Medical History:  Diagnosis Date  . Adenomatous polyp   . Chickenpox   . Diverticulitis   . Hepatitis A    as a child, no current liver problems  . Hyperlipidemia   . Hypertension    he also sees Dr. Lynita Lombard in [place], [place]   . Lower GI bleed    Hx: of  . Nephrolithiasis    2-3 kidney stones in past  . Obesity   . Pneumonia   . Prediabetes   . Sleep apnea 2003   CPAP machine, pt does not know settings   Past Surgical History:  Procedure Laterality Date  . APPENDECTOMY   10-62yrs ago  . CLOSED REDUCTION FINGER WITH PERCUTANEOUS PINNING Left 04/22/2013   Procedure: LEFT WRIST CLOSED REDUCTION CONVERTED TO OPEN REDUCTION INTERNAL FIXATION, LEFT CARPAL TUNNEL RELEASE;  Surgeon: Linna Hoff, MD;  Location: [facility];  Service: Orthopedics;  Laterality: Left;  . COLONOSCOPY  07-09-09   benign polyp repeat 5 years, Dr.Sam [facility]  . HERNIA REPAIR     repaired x 4  . INSERTION OF MESH  04/09/2012   Procedure: INSERTION OF MESH;  Surgeon: Earnstine Regal, MD;  Location[facility];  Service: General;  Laterality: N/A;  . TONSILLECTOMY  as child  . UVULECTOMY  yrs ago   for snoring  . VENTRAL HERNIA REPAIR  04/09/2012   Procedure: LAPAROSCOPIC VENTRAL HERNIA;  Surgeon: Earnstine Regal, MD;  Location[facility];  Service: General;  Laterality: N/A;  Laparoscopic Ventral Incisional Hernia Repair with Mesh   Social History   Social History  . Marital status: Married    Spouse name: Baxter Flattery  . Number of children: 4  . Years of education: 34   Occupational History  . Self employed: [employer]   Social History Main Topics  . Smoking status: Never Smoker  . Smokeless tobacco: Never  Used  . Alcohol use 0.0 oz/week     Comment: 2-3 glasses 2-3 times a week-wine; occasional liquior drinks  . Drug use: No  . Sexual activity: No   Other Topics Concern  . Not on file   Social History Narrative   Married.  Lives with  wife.  Independent of ADLs.   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 Stroke Mother     late 23s  . CAD Mother     MI in her 56s  . CAD Father 39  . Marfan syndrome Brother   . Diabetes Brother   . Hypertension Brother   . Obesity Brother   . Colon cancer Neg Hx         Review of Systems Has high cardiac calcium score and concerned about that and multiple other family members predeceased him and he is concerned about his overall health and wants to be proactive.   Objective:   Physical Exam @BP  132/88 (BP Location: Left Arm, Patient Position: Sitting, Cuff Size: Normal)   Pulse 100   Ht 5' 11.75" (1.822 m) Comment: height measured without shoes  Wt 278 lb (126.1 kg)   BMI 37.97 kg/m @  General:  Well-developed, well-nourished and in no acute distress Eyes:  anicteric. ENT:   Mouth and posterior pharynx free of lesions.  Neck:   supple w/o thyromegaly or mass.  Lungs: Clear to auscultation bilaterally. Heart:  S1S2, no rubs, murmurs, gallops. Abdomen:  soft, non-tender, no hepatosplenomegaly, hernia, or mass and BS+. + scars, diastasis recti Rectal: deferred Lymph:  no cervical or supraclavicular adenopathy. Extremities:   no edema, cyanosis or clubbing Skin   no rash.+ tattoos Neuro:  A&O x 3.  Psych:  appropriate mood and  Affect.   Data Reviewed:   As per HPI - have reviewed multiple hospital notes/records, CT scans and images, Dr. Sandi Mariscal notes 2017, [facility] office notes, colonoscopy and pathology records.  `

## 2016-04-17 ENCOUNTER — Encounter: Payer: Self-pay | Admitting: Internal Medicine

## 2016-05-06 ENCOUNTER — Encounter: Payer: Self-pay | Admitting: Internal Medicine

## 2016-05-20 ENCOUNTER — Encounter: Payer: Self-pay | Admitting: Internal Medicine

## 2016-05-20 ENCOUNTER — Ambulatory Visit (AMBULATORY_SURGERY_CENTER): Payer: 59 | Admitting: Internal Medicine

## 2016-05-20 VITALS — BP 154/91 | HR 76 | Temp 98.9°F | Resp 18 | Ht 71.0 in | Wt 278.0 lb

## 2016-05-20 DIAGNOSIS — K635 Polyp of colon: Secondary | ICD-10-CM | POA: Diagnosis not present

## 2016-05-20 DIAGNOSIS — K921 Melena: Secondary | ICD-10-CM | POA: Diagnosis present

## 2016-05-20 DIAGNOSIS — G4730 Sleep apnea, unspecified: Secondary | ICD-10-CM | POA: Diagnosis not present

## 2016-05-20 DIAGNOSIS — I10 Essential (primary) hypertension: Secondary | ICD-10-CM | POA: Diagnosis not present

## 2016-05-20 DIAGNOSIS — K633 Ulcer of intestine: Secondary | ICD-10-CM | POA: Diagnosis not present

## 2016-05-20 DIAGNOSIS — K5731 Diverticulosis of large intestine without perforation or abscess with bleeding: Secondary | ICD-10-CM | POA: Diagnosis not present

## 2016-05-20 DIAGNOSIS — D125 Benign neoplasm of sigmoid colon: Secondary | ICD-10-CM

## 2016-05-20 HISTORY — PX: COLONOSCOPY: SHX174

## 2016-05-20 MED ORDER — SODIUM CHLORIDE 0.9 % IV SOLN: 500.0000 mL | INTRAVENOUS | Status: DC

## 2016-05-20 NOTE — Progress Notes (Signed)
A/ox3, pleased with MAC, report to RN 

## 2016-05-20 NOTE — Progress Notes (Signed)
Clip card given to pt.

## 2016-05-20 NOTE — Progress Notes (Signed)
Pt's states no medical or surgical changes since previsit or office visit. 

## 2016-05-20 NOTE — Progress Notes (Signed)
Called to room to assist during endoscopic procedure.  Patient ID and intended procedure confirmed with present staff. Received instructions for my participation in the procedure from the performing physician.  

## 2016-05-20 NOTE — Patient Instructions (Addendum)
I found severe diverticulosis and some slight inflammatory changes oozing blood in one of the diverticula - treated with clips. This could be a source of bleeding but I would have to say it is unlikely to have caused all of your bleeding.  I found and removed one tiny polyp that was probably inflammatory also - I clipped that site.  The clips will create scarring I hope and if these sites have been bleeding it may stop that.  You also have large, inflamed internal hemorrhoids that are likely causing some of the bleeding you have but probably not when you bleed and lose units of blood. We should think about treating these.  I will call you to discuss when pathology returns.  I appreciate the opportunity to care for you. Gatha Mayer, MD, FACG  YOU HAD AN ENDOSCOPIC PROCEDURE TODAY AT [facility]:   Refer to the procedure report that was given to you for any specific questions about what was found during the examination.  If the procedure report does not answer your questions, please call your gastroenterologist to clarify.  If you requested that your care partner not be given the details of your procedure findings, then the procedure report has been included in a sealed envelope for you to review at your convenience later.  YOU SHOULD EXPECT: Some feelings of bloating in the abdomen. Passage of more gas than usual.  Walking can help get rid of the air that was put into your GI tract during the procedure and reduce the bloating. If you had a lower endoscopy (such as a colonoscopy or flexible sigmoidoscopy) you may notice spotting of blood in your stool or on the toilet paper. If you underwent a bowel prep for your procedure, you may not have a normal bowel movement for a few days.  Please Note:  You might notice some irritation and congestion in your nose or some drainage.  This is from the oxygen used during your procedure.  There is no need for concern and it should  clear up in a day or so.  SYMPTOMS TO REPORT IMMEDIATELY:   Following lower endoscopy (colonoscopy or flexible sigmoidoscopy):  Excessive amounts of blood in the stool  Significant tenderness or worsening of abdominal pains  Swelling of the abdomen that is new, acute  Fever of 100F or higher  For urgent or emergent issues, a gastroenterologist can be reached at any hour by calling [phone].   DIET:  We do recommend a small meal at first, but then you may proceed to your regular diet.  Drink plenty of fluids but you should avoid alcoholic beverages for 24 hours.  ACTIVITY:  You should plan to take it easy for the rest of today and you should NOT DRIVE or use heavy machinery until tomorrow (because of the sedation medicines used during the test).    FOLLOW UP: Our staff will call the number listed on your records the next business day following your procedure to check on you and address any questions or concerns that you may have regarding the information given to you following your procedure. If we do not reach you, we will leave a message.  However, if you are feeling well and you are not experiencing any problems, there is no need to return our call.  We will assume that you have returned to your regular daily activities without incident.  If any biopsies were taken you will be contacted by phone or by  letter within the next 1-3 weeks.  Please call us at [phone] if you have not heard about the biopsies in 3 weeks.   SIGNATURES/CONFIDENTIALITY: You and/or your care partner have signed paperwork which will be entered into your electronic medical record.  These signatures attest to the fact that that the information above on your After Visit Summary has been reviewed and is understood.  Full responsibility of the confidentiality of this discharge information lies with you and/or your care-partner.  Please read over handouts about polyps, hemorrhoids, hemorrhoidal banding, and  diverticulosis  Please continue your normal medications

## 2016-05-20 NOTE — Op Note (Signed)
[name] Patient Name: Oscar [name] Procedure Date: 05/20/2016 2:57 PM MRN: [id number] Endoscopist: Gatha Mayer , MD Age: 56 Referring MD:  Date of Birth: Dec 08, 1960 Gender: Male Account #: [id number] Procedure:                Colonoscopy Indications:              Hematochezia Medicines:                Propofol per Anesthesia, Monitored Anesthesia Care Procedure:                Pre-Anesthesia Assessment:                           - Prior to the procedure, a History and Physical                            was performed, and patient medications and                            allergies were reviewed. The patient's tolerance of                            previous anesthesia was also reviewed. The risks                            and benefits of the procedure and the sedation                            options and risks were discussed with the patient.                            All questions were answered, and informed consent                            was obtained. Prior Anticoagulants: The patient has                            taken no previous anticoagulant or antiplatelet                            agents. ASA Grade Assessment: III - A patient with                            severe systemic disease. After reviewing the risks                            and benefits, the patient was deemed in                            satisfactory condition to undergo the procedure.                           After obtaining informed consent, the colonoscope  was passed under direct vision. Throughout the                            procedure, the patient's blood pressure, pulse, and                            oxygen saturations were monitored continuously. The                            Model CF-HQ190L [id number]) scope was introduced                            through the anus and advanced to the the terminal                            ileum, with  identification of the appendiceal                            orifice and IC valve. The colonoscopy was performed                            without difficulty. The patient tolerated the                            procedure well. The quality of the bowel                            preparation was good. The terminal ileum, ileocecal                            valve, appendiceal orifice, and rectum were                            photographed. The bowel preparation used was                            Miralax. Scope In: 3:05:49 PM Scope Out: 3:37:51 PM Scope Withdrawal Time: 0 hours 28 minutes 1 second  Total Procedure Duration: 0 hours 32 minutes 2 seconds  Findings:                 The perianal and digital rectal examinations were                            normal. Pertinent negatives include normal prostate                            (size, shape, and consistency).                           Many small and large-mouthed diverticula were found                            in the sigmoid colon. There was active bleeding  coming from the diverticular opening. To stop                            active bleeding, two hemostatic clips were                            successfully placed (MR conditional). There was no                            bleeding at the end of the procedure.                           A diminutive polyp was found in the sigmoid colon.                            The polyp was sessile. The polyp was removed with a                            cold biopsy forceps. Resection and retrieval were                            complete. To prevent bleeding after the biopsy, one                            hemostatic clip was successfully placed (MR                            conditional). There was no bleeding at the end of                            the procedure. Estimated blood loss was minimal.                           Internal hemorrhoids were found during                             retroflexion. The hemorrhoids were large.                           The exam was otherwise without abnormality on                            direct and retroflexion views.                           The terminal ileum appeared normal. Complications:            No immediate complications. Estimated Blood Loss:     Estimated blood loss was minimal. Impression:               - Severe diverticulosis in the sigmoid colon. There                            was active bleeding coming from the diverticular  opening. Clips (MR conditional) were placed.                           - One diminutive polyp in the sigmoid colon,                            removed with a cold biopsy forceps. Resected and                            retrieved. Clip (MR conditional) was placed.                           - Internal hemorrhoids.                           - The examination was otherwise normal on direct                            and retroflexion views.                           - The examined portion of the ileum was normal. Recommendation:           - Patient has a contact number available for                            emergencies. The signs and symptoms of potential                            delayed complications were discussed with the                            patient. Return to normal activities tomorrow.                            Written discharge instructions were provided to the                            patient.                           - Resume previous diet.                           - Continue present medications.                           - Repeat colonoscopy is recommended. The                            colonoscopy date will be determined after pathology                            results from today's exam become available for  review.                           - WILL CALL HIM AFTER PATHOLOGY IN - THE CLIPS                             PLACED TODAY WERE AN ATTEMPT TO REDUCE THE BLEEDING                            HE HAS HAD THOUGH I CANNOT BE CERTAIN THESE AREAS                            WERE SOURCES THERE WERE FOCALINFLAMMATORY CHANGES                            WITH BLEEDING SEEN SO CLIPS PLACED.                           HE MAY NEED TREATMENT OF HEMORRHOIDS - I SUSPCET HE                            HAS SOME BLEEDING FROM THESE BUT OVERALL THINK HE                            HAS HAD RECURRENT DIVERTICULAR BLEEDING AND SOME                            HEMORRHOIDAL BLEEDING. NO SIGNS OF LIVER DISEASE TO                            SUGGEST PORTAL HYPERTENSION.                           HE IS NOT A GOOD CANDIDATE FOR ELECTIVE RESECTION                            OF LEFT COLON GIVEN NUMEROUS HERNIA SURGERIES AND                            MESH. Gatha Mayer, MD 05/20/2016 3:54:57 PM This report has been signed electronically.

## 2016-05-21 ENCOUNTER — Telehealth: Payer: Self-pay | Admitting: *Deleted

## 2016-05-21 NOTE — Telephone Encounter (Signed)
  Follow up Call-  Call back number 05/20/2016  Post procedure Call Back phone  # [phone]  Permission to leave phone message Yes  Some recent data might be hidden    [facility]

## 2016-05-21 NOTE — Telephone Encounter (Signed)
No answer and no voicemail available will attempt to call back later this afternoon. SM

## 2016-05-24 NOTE — Progress Notes (Signed)
Message left that I would call back 

## 2016-05-27 ENCOUNTER — Encounter: Payer: Self-pay | Admitting: Internal Medicine

## 2016-05-27 NOTE — Progress Notes (Signed)
Please cc his PCP Marylene Land, MD

## 2016-05-27 NOTE — Progress Notes (Signed)
Discussed the results - inflammatory polyp We decided not to pursue hemorrhoid banding as the bleeds he has had are most likely diverticular bleeding. Explained that the clip placements were not proven to make a difference but low risk and given the bleeding from the sites worth a try  No letter - colon recall 10 yrs 2028

## 2016-05-31 DIAGNOSIS — R2241 Localized swelling, mass and lump, right lower limb: Secondary | ICD-10-CM | POA: Diagnosis not present

## 2016-05-31 DIAGNOSIS — S8011XA Contusion of right lower leg, initial encounter: Secondary | ICD-10-CM | POA: Diagnosis not present

## 2016-05-31 DIAGNOSIS — F5101 Primary insomnia: Secondary | ICD-10-CM | POA: Diagnosis not present

## 2016-06-21 ENCOUNTER — Telehealth: Payer: Self-pay | Admitting: Cardiology

## 2016-07-04 NOTE — Telephone Encounter (Signed)
close

## 2016-10-01 ENCOUNTER — Telehealth: Payer: Self-pay | Admitting: Internal Medicine

## 2016-10-01 DIAGNOSIS — K921 Melena: Secondary | ICD-10-CM

## 2016-10-01 NOTE — Telephone Encounter (Signed)
About 6 episodes of rectal bleeding today - At [facility] now. Feels ok. We had discussed trying to study him while bleeding - if bleeding persists we can possibly do that though I am out of office I asked him to text me and contact me tomorrow with an update.

## 2016-10-02 NOTE — Telephone Encounter (Signed)
Bleeding essentially resolved. Asked him ti get CBC tomorrow.

## 2016-10-03 ENCOUNTER — Other Ambulatory Visit (INDEPENDENT_AMBULATORY_CARE_PROVIDER_SITE_OTHER): Payer: 59

## 2016-10-03 DIAGNOSIS — K921 Melena: Secondary | ICD-10-CM

## 2016-10-03 LAB — CBC WITH DIFFERENTIAL/PLATELET
BASOS ABS: 0 10*3/uL (ref 0.0–0.1)
Basophils Relative: 0.3 % (ref 0.0–3.0)
EOS ABS: 0.1 10*3/uL (ref 0.0–0.7)
Eosinophils Relative: 0.7 % (ref 0.0–5.0)
HCT: 44.2 % (ref 39.0–52.0)
HEMOGLOBIN: 14.7 g/dL (ref 13.0–17.0)
LYMPHS ABS: 1.1 10*3/uL (ref 0.7–4.0)
Lymphocytes Relative: 10.4 % — ABNORMAL LOW (ref 12.0–46.0)
MCHC: 33.2 g/dL (ref 30.0–36.0)
MCV: 88.4 fl (ref 78.0–100.0)
MONO ABS: 0.9 10*3/uL (ref 0.1–1.0)
Monocytes Relative: 8.5 % (ref 3.0–12.0)
NEUTROS PCT: 80.1 % — AB (ref 43.0–77.0)
Neutro Abs: 8.6 10*3/uL — ABNORMAL HIGH (ref 1.4–7.7)
Platelets: 252 10*3/uL (ref 150.0–400.0)
RBC: 5 Mil/uL (ref 4.22–5.81)
RDW: 16.6 % — ABNORMAL HIGH (ref 11.5–15.5)
WBC: 10.7 10*3/uL — AB (ref 4.0–10.5)

## 2016-10-04 NOTE — Progress Notes (Signed)
I have reviewed results with him. We need to contact him about an office appointment next week to review rectal bleeding and discuss hemorrhoid banding (not a banding appt). Ok to work him in somewhere - Wed AM or PM office Mon or [facility] is my preference - if that doesn't work could be next week if he can

## 2016-10-08 ENCOUNTER — Telehealth: Payer: Self-pay | Admitting: Internal Medicine

## 2016-10-08 NOTE — Telephone Encounter (Deleted)
A user error has taken place: ERROR °

## 2016-10-08 NOTE — Telephone Encounter (Signed)
Patient scheduled for 12/05/16 4:00. See results notes for additional details.

## 2016-11-22 DIAGNOSIS — B029 Zoster without complications: Secondary | ICD-10-CM | POA: Diagnosis not present

## 2016-12-05 ENCOUNTER — Encounter: Payer: Self-pay | Admitting: Internal Medicine

## 2016-12-05 ENCOUNTER — Ambulatory Visit (INDEPENDENT_AMBULATORY_CARE_PROVIDER_SITE_OTHER): Payer: 59 | Admitting: Internal Medicine

## 2016-12-05 ENCOUNTER — Encounter (INDEPENDENT_AMBULATORY_CARE_PROVIDER_SITE_OTHER): Payer: Self-pay

## 2016-12-05 VITALS — BP 130/98 | HR 94 | Ht 71.75 in | Wt 277.0 lb

## 2016-12-05 DIAGNOSIS — K641 Second degree hemorrhoids: Secondary | ICD-10-CM

## 2016-12-05 NOTE — Patient Instructions (Addendum)
HEMORRHOID BANDING PROCEDURE    FOLLOW-UP CARE   1. The procedure you have had should have been relatively painless since the banding of the area involved does not have nerve endings and there is no pain sensation.  The rubber band cuts off the blood supply to the hemorrhoid and the band may fall off as soon as 48 hours after the banding (the band may occasionally be seen in the toilet bowl following a bowel movement). You may notice a temporary feeling of fullness in the rectum which should respond adequately to plain Tylenol or Motrin.  2. Following the banding, avoid strenuous exercise that evening and resume full activity the next day.  A sitz bath (soaking in a warm tub) or bidet is soothing, and can be useful for cleansing the area after bowel movements.     3. To avoid constipation, take two tablespoons of natural wheat bran, natural oat bran, flax, Benefiber or any over the counter fiber supplement and increase your water intake to 7-8 glasses daily.    4. Unless you have been prescribed anorectal medication, do not put anything inside your rectum for two weeks: No suppositories, enemas, fingers, etc.  5. Occasionally, you may have more bleeding than usual after the banding procedure.  This is often from the untreated hemorrhoids rather than the treated one.  Don't be concerned if there is a tablespoon or so of blood.  If there is more blood than this, lie flat with your bottom higher than your head and apply an ice pack to the area. If the bleeding does not stop within a half an hour or if you feel faint, call our office at [phone] or go to the emergency room.  6. Problems are not common; however, if there is a substantial amount of bleeding, severe pain, chills, fever or difficulty passing urine (very rare) or other problems, you should call us at [phone] or report to the nearest emergency room.  7. Do not stay seated continuously for more than 2-3 hours for a day or two  after the procedure.  Tighten your buttock muscles 10-15 times every two hours and take 10-15 deep breaths every 1-2 hours.  Do not spend more than a few minutes on the toilet if you cannot empty your bowel; instead re-visit the toilet at a later time.    Please come back October 3rd at 11:15AM for more bandings.   I appreciate the opportunity to care for you. Silvano Rusk, MD, [facility]

## 2016-12-05 NOTE — Progress Notes (Signed)
    HEMORRHOID LIGATION:  Symptoms are intermittent bleeding. Suspect some hemorrhoidal versus diverticular.  Rectal exam no mass slight induration posterior anal canal. There is history of anal fissure surgery. Slightly decreased tone in that area. It is nontender in the anoderm looks normal.  Anoscopic exam shows grade 2 moderately inflamed internal hemorrhoids in all 3 positions.  PROCEDURE NOTE: The patient presents with symptomatic grade 2  hemorrhoids, requesting rubber band ligation of his/her hemorrhoidal disease.  All risks, benefits and alternative forms of therapy were described and informed consent was obtained.   The anorectum was pre-medicated with 0.125% ntg AND 5% LIDOCAINE The decision was made to band the RP internal hemorrhoid, and the [place] was used to perform band ligation without complication. LATEX FREE BANDS USEDDigital anorectal examination was then performed to assure proper positioning of the band, and to adjust the banded tissue as required.  The patient was discharged home without pain or other issues.  Dietary and behavioral recommendations were given and along with follow-up instructions.     The patient will return in early October for  follow-up and possible additional banding as required. No complications were encountered and the patient tolerated the procedure well.  I appreciate the opportunity to care for this patient. CC: Derinda Late, MD

## 2016-12-05 NOTE — Assessment & Plan Note (Signed)
Right posterior column banded today. WIll return in early October for additional banding.

## 2017-01-01 ENCOUNTER — Encounter: Payer: 59 | Admitting: Internal Medicine

## 2017-01-17 DIAGNOSIS — Z8249 Family history of ischemic heart disease and other diseases of the circulatory system: Secondary | ICD-10-CM | POA: Diagnosis not present

## 2017-01-17 DIAGNOSIS — I10 Essential (primary) hypertension: Secondary | ICD-10-CM | POA: Diagnosis not present

## 2017-01-17 DIAGNOSIS — R931 Abnormal findings on diagnostic imaging of heart and coronary circulation: Secondary | ICD-10-CM | POA: Diagnosis not present

## 2017-06-17 DIAGNOSIS — Z125 Encounter for screening for malignant neoplasm of prostate: Secondary | ICD-10-CM | POA: Diagnosis not present

## 2017-06-17 DIAGNOSIS — Z0000 Encounter for general adult medical examination without abnormal findings: Secondary | ICD-10-CM | POA: Diagnosis not present

## 2017-07-02 DIAGNOSIS — I2510 Atherosclerotic heart disease of native coronary artery without angina pectoris: Secondary | ICD-10-CM | POA: Diagnosis not present

## 2017-07-02 DIAGNOSIS — G4733 Obstructive sleep apnea (adult) (pediatric): Secondary | ICD-10-CM | POA: Diagnosis not present

## 2017-07-02 DIAGNOSIS — J219 Acute bronchiolitis, unspecified: Secondary | ICD-10-CM | POA: Diagnosis not present

## 2017-07-02 DIAGNOSIS — Z0000 Encounter for general adult medical examination without abnormal findings: Secondary | ICD-10-CM | POA: Diagnosis not present

## 2017-07-03 ENCOUNTER — Other Ambulatory Visit: Payer: Self-pay | Admitting: Family Medicine

## 2017-07-03 DIAGNOSIS — I2510 Atherosclerotic heart disease of native coronary artery without angina pectoris: Secondary | ICD-10-CM

## 2017-07-08 DIAGNOSIS — M79662 Pain in left lower leg: Secondary | ICD-10-CM | POA: Insufficient documentation

## 2017-07-08 DIAGNOSIS — S86112A Strain of other muscle(s) and tendon(s) of posterior muscle group at lower leg level, left leg, initial encounter: Secondary | ICD-10-CM | POA: Diagnosis not present

## 2017-07-15 ENCOUNTER — Ambulatory Visit
Admission: RE | Admit: 2017-07-15 | Discharge: 2017-07-15 | Disposition: A | Discharge status: Home / Self Care | Payer: Self-pay | Source: Ambulatory Visit | Attending: Family Medicine | Admitting: Family Medicine

## 2017-07-15 DIAGNOSIS — I2510 Atherosclerotic heart disease of native coronary artery without angina pectoris: Secondary | ICD-10-CM

## 2017-07-15 MED ORDER — IOPAMIDOL (ISOVUE-370) INJECTION 76%
75.0000 mL | Freq: Once | INTRAVENOUS | Status: AC | PRN
  Administered 2017-07-15: 75 mL via INTRAVENOUS

## 2017-08-15 DIAGNOSIS — R931 Abnormal findings on diagnostic imaging of heart and coronary circulation: Secondary | ICD-10-CM | POA: Diagnosis not present

## 2017-08-15 DIAGNOSIS — I10 Essential (primary) hypertension: Secondary | ICD-10-CM | POA: Diagnosis not present

## 2017-08-15 DIAGNOSIS — Z8249 Family history of ischemic heart disease and other diseases of the circulatory system: Secondary | ICD-10-CM | POA: Diagnosis not present

## 2017-09-02 DIAGNOSIS — I2510 Atherosclerotic heart disease of native coronary artery without angina pectoris: Secondary | ICD-10-CM | POA: Diagnosis not present

## 2017-09-02 DIAGNOSIS — R0602 Shortness of breath: Secondary | ICD-10-CM | POA: Diagnosis not present

## 2017-09-02 DIAGNOSIS — I2584 Coronary atherosclerosis due to calcified coronary lesion: Secondary | ICD-10-CM | POA: Diagnosis not present

## 2017-09-02 DIAGNOSIS — R918 Other nonspecific abnormal finding of lung field: Secondary | ICD-10-CM | POA: Diagnosis not present

## 2017-09-02 DIAGNOSIS — Z8249 Family history of ischemic heart disease and other diseases of the circulatory system: Secondary | ICD-10-CM | POA: Diagnosis not present

## 2017-09-23 DIAGNOSIS — I2510 Atherosclerotic heart disease of native coronary artery without angina pectoris: Secondary | ICD-10-CM | POA: Diagnosis not present

## 2017-09-23 DIAGNOSIS — R9389 Abnormal findings on diagnostic imaging of other specified body structures: Secondary | ICD-10-CM | POA: Diagnosis not present

## 2017-09-23 DIAGNOSIS — I10 Essential (primary) hypertension: Secondary | ICD-10-CM | POA: Diagnosis not present

## 2017-09-23 DIAGNOSIS — Z8249 Family history of ischemic heart disease and other diseases of the circulatory system: Secondary | ICD-10-CM | POA: Diagnosis not present

## 2017-09-23 DIAGNOSIS — R931 Abnormal findings on diagnostic imaging of heart and coronary circulation: Secondary | ICD-10-CM | POA: Diagnosis not present

## 2017-09-23 DIAGNOSIS — R079 Chest pain, unspecified: Secondary | ICD-10-CM | POA: Diagnosis not present

## 2017-10-10 ENCOUNTER — Telehealth: Payer: Self-pay | Admitting: Internal Medicine

## 2017-10-10 NOTE — Telephone Encounter (Signed)
Spoke to Dr. Rogelio Seen of [facility] re: ASAA use  Thought ok to take that if he wanted patient to  Is at increased risk of bleeding but acceptable  He thought that he could hhold off on the ASA and use statins  Communicated via text to patient also same info  He has not bled since last year - clips in sigmoid tic area and hemorrhoid band RA

## 2018-03-10 DIAGNOSIS — Z8249 Family history of ischemic heart disease and other diseases of the circulatory system: Secondary | ICD-10-CM | POA: Diagnosis not present

## 2018-03-10 DIAGNOSIS — R931 Abnormal findings on diagnostic imaging of heart and coronary circulation: Secondary | ICD-10-CM | POA: Diagnosis not present

## 2018-03-10 DIAGNOSIS — I10 Essential (primary) hypertension: Secondary | ICD-10-CM | POA: Diagnosis not present

## 2018-04-30 DIAGNOSIS — M25522 Pain in left elbow: Secondary | ICD-10-CM | POA: Insufficient documentation

## 2018-10-21 ENCOUNTER — Inpatient Hospital Stay (HOSPITAL_COMMUNITY): Payer: No Typology Code available for payment source

## 2018-10-21 ENCOUNTER — Encounter (HOSPITAL_COMMUNITY): Payer: Self-pay | Admitting: Emergency Medicine

## 2018-10-21 ENCOUNTER — Other Ambulatory Visit: Payer: Self-pay

## 2018-10-21 ENCOUNTER — Inpatient Hospital Stay (HOSPITAL_COMMUNITY)
Admission: EM | Admit: 2018-10-21 | Discharge: 2018-10-27 | DRG: 234 | Disposition: A | Discharge status: Home / Self Care | Payer: No Typology Code available for payment source | Attending: Cardiothoracic Surgery | Admitting: Cardiothoracic Surgery

## 2018-10-21 ENCOUNTER — Inpatient Hospital Stay (HOSPITAL_COMMUNITY)
Admission: EM | Disposition: A | Discharge status: Home / Self Care | Payer: Self-pay | Source: Home / Self Care | Attending: Interventional Cardiology

## 2018-10-21 ENCOUNTER — Other Ambulatory Visit: Payer: Self-pay | Admitting: *Deleted

## 2018-10-21 DIAGNOSIS — Z955 Presence of coronary angioplasty implant and graft: Secondary | ICD-10-CM

## 2018-10-21 DIAGNOSIS — Z9889 Other specified postprocedural states: Secondary | ICD-10-CM

## 2018-10-21 DIAGNOSIS — G4730 Sleep apnea, unspecified: Secondary | ICD-10-CM | POA: Diagnosis present

## 2018-10-21 DIAGNOSIS — R079 Chest pain, unspecified: Secondary | ICD-10-CM

## 2018-10-21 DIAGNOSIS — Z823 Family history of stroke: Secondary | ICD-10-CM | POA: Diagnosis not present

## 2018-10-21 DIAGNOSIS — E669 Obesity, unspecified: Secondary | ICD-10-CM | POA: Diagnosis present

## 2018-10-21 DIAGNOSIS — Z833 Family history of diabetes mellitus: Secondary | ICD-10-CM | POA: Diagnosis not present

## 2018-10-21 DIAGNOSIS — Z7982 Long term (current) use of aspirin: Secondary | ICD-10-CM | POA: Diagnosis not present

## 2018-10-21 DIAGNOSIS — I213 ST elevation (STEMI) myocardial infarction of unspecified site: Secondary | ICD-10-CM

## 2018-10-21 DIAGNOSIS — I340 Nonrheumatic mitral (valve) insufficiency: Secondary | ICD-10-CM | POA: Diagnosis present

## 2018-10-21 DIAGNOSIS — Z20828 Contact with and (suspected) exposure to other viral communicable diseases: Secondary | ICD-10-CM | POA: Diagnosis present

## 2018-10-21 DIAGNOSIS — E118 Type 2 diabetes mellitus with unspecified complications: Secondary | ICD-10-CM | POA: Diagnosis present

## 2018-10-21 DIAGNOSIS — E8770 Fluid overload, unspecified: Secondary | ICD-10-CM | POA: Diagnosis not present

## 2018-10-21 DIAGNOSIS — I10 Essential (primary) hypertension: Secondary | ICD-10-CM

## 2018-10-21 DIAGNOSIS — D62 Acute posthemorrhagic anemia: Secondary | ICD-10-CM | POA: Diagnosis not present

## 2018-10-21 DIAGNOSIS — Z01810 Encounter for preprocedural cardiovascular examination: Secondary | ICD-10-CM

## 2018-10-21 DIAGNOSIS — E119 Type 2 diabetes mellitus without complications: Secondary | ICD-10-CM

## 2018-10-21 DIAGNOSIS — E785 Hyperlipidemia, unspecified: Secondary | ICD-10-CM

## 2018-10-21 DIAGNOSIS — I2119 ST elevation (STEMI) myocardial infarction involving other coronary artery of inferior wall: Secondary | ICD-10-CM | POA: Diagnosis present

## 2018-10-21 DIAGNOSIS — E876 Hypokalemia: Secondary | ICD-10-CM | POA: Diagnosis present

## 2018-10-21 DIAGNOSIS — I4892 Unspecified atrial flutter: Secondary | ICD-10-CM | POA: Diagnosis not present

## 2018-10-21 DIAGNOSIS — K5731 Diverticulosis of large intestine without perforation or abscess with bleeding: Secondary | ICD-10-CM

## 2018-10-21 DIAGNOSIS — G4733 Obstructive sleep apnea (adult) (pediatric): Secondary | ICD-10-CM | POA: Diagnosis present

## 2018-10-21 DIAGNOSIS — Z7984 Long term (current) use of oral hypoglycemic drugs: Secondary | ICD-10-CM

## 2018-10-21 DIAGNOSIS — I4891 Unspecified atrial fibrillation: Secondary | ICD-10-CM | POA: Diagnosis present

## 2018-10-21 DIAGNOSIS — Z8249 Family history of ischemic heart disease and other diseases of the circulatory system: Secondary | ICD-10-CM

## 2018-10-21 DIAGNOSIS — Z8279 Family history of other congenital malformations, deformations and chromosomal abnormalities: Secondary | ICD-10-CM | POA: Diagnosis not present

## 2018-10-21 DIAGNOSIS — E1169 Type 2 diabetes mellitus with other specified complication: Secondary | ICD-10-CM | POA: Diagnosis present

## 2018-10-21 DIAGNOSIS — Z6832 Body mass index (BMI) 32.0-32.9, adult: Secondary | ICD-10-CM | POA: Diagnosis not present

## 2018-10-21 DIAGNOSIS — Z951 Presence of aortocoronary bypass graft: Secondary | ICD-10-CM

## 2018-10-21 DIAGNOSIS — Z888 Allergy status to other drugs, medicaments and biological substances status: Secondary | ICD-10-CM

## 2018-10-21 DIAGNOSIS — I2510 Atherosclerotic heart disease of native coronary artery without angina pectoris: Secondary | ICD-10-CM

## 2018-10-21 HISTORY — PX: CORONARY/GRAFT ACUTE MI REVASCULARIZATION: CATH118305

## 2018-10-21 HISTORY — PX: LEFT HEART CATH AND CORONARY ANGIOGRAPHY: CATH118249

## 2018-10-21 LAB — CBC WITH DIFFERENTIAL/PLATELET
Abs Immature Granulocytes: 0.02 10*3/uL (ref 0.00–0.07)
Basophils Absolute: 0.1 10*3/uL (ref 0.0–0.1)
Basophils Relative: 1 %
Eosinophils Absolute: 0.1 10*3/uL (ref 0.0–0.5)
Eosinophils Relative: 1 %
HCT: 54.3 % — ABNORMAL HIGH (ref 39.0–52.0)
Hemoglobin: 18.7 g/dL — ABNORMAL HIGH (ref 13.0–17.0)
Immature Granulocytes: 0 %
Lymphocytes Relative: 13 %
Lymphs Abs: 1.3 10*3/uL (ref 0.7–4.0)
MCH: 30.2 pg (ref 26.0–34.0)
MCHC: 34.4 g/dL (ref 30.0–36.0)
MCV: 87.7 fL (ref 80.0–100.0)
Monocytes Absolute: 1.3 10*3/uL — ABNORMAL HIGH (ref 0.1–1.0)
Monocytes Relative: 13 %
Neutro Abs: 7.4 10*3/uL (ref 1.7–7.7)
Neutrophils Relative %: 72 %
Platelets: 256 10*3/uL (ref 150–400)
RBC: 6.19 MIL/uL — ABNORMAL HIGH (ref 4.22–5.81)
RDW: 13.9 % (ref 11.5–15.5)
WBC: 10.2 10*3/uL (ref 4.0–10.5)
nRBC: 0 % (ref 0.0–0.2)

## 2018-10-21 LAB — URINALYSIS, ROUTINE W REFLEX MICROSCOPIC
Bacteria, UA: NONE SEEN
Bilirubin Urine: NEGATIVE
Glucose, UA: NEGATIVE mg/dL
Ketones, ur: NEGATIVE mg/dL
Leukocytes,Ua: NEGATIVE
Nitrite: NEGATIVE
Protein, ur: NEGATIVE mg/dL
Specific Gravity, Urine: 1.009 (ref 1.005–1.030)
pH: 6 (ref 5.0–8.0)

## 2018-10-21 LAB — BASIC METABOLIC PANEL
Anion gap: 9 (ref 5–15)
BUN: 16 mg/dL (ref 6–20)
CO2: 27 mmol/L (ref 22–32)
Calcium: 9.3 mg/dL (ref 8.9–10.3)
Chloride: 102 mmol/L (ref 98–111)
Creatinine, Ser: 1.15 mg/dL (ref 0.61–1.24)
GFR calc Af Amer: >60 mL/min (ref >60)
GFR calc non Af Amer: >60 mL/min (ref >60)
Glucose, Bld: 146 mg/dL — ABNORMAL HIGH (ref 70–99)
Potassium: 3.3 mmol/L — ABNORMAL LOW (ref 3.5–5.1)
Sodium: 138 mmol/L (ref 135–145)

## 2018-10-21 LAB — ECHOCARDIOGRAM COMPLETE
Height: 74 in
Weight: 4000 oz

## 2018-10-21 LAB — POCT I-STAT, CHEM 8
BUN: 18 mg/dL (ref 6–20)
Calcium, Ion: 1.24 mmol/L (ref 1.15–1.40)
Chloride: 99 mmol/L (ref 98–111)
Creatinine, Ser: 1 mg/dL (ref 0.61–1.24)
Glucose, Bld: 204 mg/dL — ABNORMAL HIGH (ref 70–99)
HCT: 54 % — ABNORMAL HIGH (ref 39.0–52.0)
Hemoglobin: 18.4 g/dL — ABNORMAL HIGH (ref 13.0–17.0)
Potassium: 3 mmol/L — ABNORMAL LOW (ref 3.5–5.1)
Sodium: 140 mmol/L (ref 135–145)
TCO2: 26 mmol/L (ref 22–32)

## 2018-10-21 LAB — COMPREHENSIVE METABOLIC PANEL
ALT: 24 U/L (ref 0–44)
AST: 40 U/L (ref 15–41)
Albumin: 4.1 g/dL (ref 3.5–5.0)
Alkaline Phosphatase: 63 U/L (ref 38–126)
Anion gap: 13 (ref 5–15)
BUN: 16 mg/dL (ref 6–20)
CO2: 25 mmol/L (ref 22–32)
Calcium: 9.9 mg/dL (ref 8.9–10.3)
Chloride: 99 mmol/L (ref 98–111)
Creatinine, Ser: 1.2 mg/dL (ref 0.61–1.24)
GFR calc Af Amer: >60 mL/min (ref >60)
GFR calc non Af Amer: >60 mL/min (ref >60)
Glucose, Bld: 196 mg/dL — ABNORMAL HIGH (ref 70–99)
Potassium: 3.5 mmol/L (ref 3.5–5.1)
Sodium: 137 mmol/L (ref 135–145)
Total Bilirubin: 0.5 mg/dL (ref 0.3–1.2)
Total Protein: 7.8 g/dL (ref 6.5–8.1)

## 2018-10-21 LAB — LIPID PANEL
Cholesterol: 98 mg/dL (ref 0–200)
HDL: 34 mg/dL — ABNORMAL LOW (ref >40)
LDL Cholesterol: 32 mg/dL (ref 0–99)
Total CHOL/HDL Ratio: 2.9 RATIO
Triglycerides: 160 mg/dL — ABNORMAL HIGH (ref <150)
VLDL: 32 mg/dL (ref 0–40)

## 2018-10-21 LAB — TROPONIN I (HIGH SENSITIVITY)
Troponin I (High Sensitivity): 180 ng/L (ref <18)
Troponin I (High Sensitivity): 2189 ng/L (ref <18)

## 2018-10-21 LAB — PULMONARY FUNCTION TEST
FEF 25-75 Pre: 1.86 L/sec
FEF2575-%Pred-Pre: 53 %
FEV1-%Pred-Pre: 70 %
FEV1-Pre: 2.95 L
FEV1FVC-%Pred-Pre: 96 %
FEV6-%Pred-Pre: 68 %
FEV6-Pre: 3.63 L
FEV6FVC-%Pred-Pre: 94 %
FVC-%Pred-Pre: 72 %
FVC-Pre: 4 L
Pre FEV1/FVC ratio: 74 %
Pre FEV6/FVC Ratio: 91 %

## 2018-10-21 LAB — APTT: aPTT: 36 seconds (ref 24–36)

## 2018-10-21 LAB — CBC
HCT: 52.6 % — ABNORMAL HIGH (ref 39.0–52.0)
Hemoglobin: 17.6 g/dL — ABNORMAL HIGH (ref 13.0–17.0)
MCH: 29.8 pg (ref 26.0–34.0)
MCHC: 33.5 g/dL (ref 30.0–36.0)
MCV: 89.2 fL (ref 80.0–100.0)
Platelets: 243 10*3/uL (ref 150–400)
RBC: 5.9 MIL/uL — ABNORMAL HIGH (ref 4.22–5.81)
RDW: 14 % (ref 11.5–15.5)
WBC: 11.5 10*3/uL — ABNORMAL HIGH (ref 4.0–10.5)
nRBC: 0 % (ref 0.0–0.2)

## 2018-10-21 LAB — HEPARIN LEVEL (UNFRACTIONATED): Heparin Unfractionated: 0.31 IU/mL (ref 0.30–0.70)

## 2018-10-21 LAB — TYPE AND SCREEN
ABO/RH(D): O POS
Antibody Screen: NEGATIVE

## 2018-10-21 LAB — POCT ACTIVATED CLOTTING TIME: Activated Clotting Time: 164 seconds

## 2018-10-21 LAB — SARS CORONAVIRUS 2 BY RT PCR (HOSPITAL ORDER, PERFORMED IN ~~LOC~~ HOSPITAL LAB): SARS Coronavirus 2: NEGATIVE

## 2018-10-21 LAB — HEMOGLOBIN A1C
Hgb A1c MFr Bld: 6 % — ABNORMAL HIGH (ref 4.8–5.6)
Mean Plasma Glucose: 125.5 mg/dL

## 2018-10-21 LAB — ABO/RH: ABO/RH(D): O POS

## 2018-10-21 LAB — HIV ANTIBODY (ROUTINE TESTING W REFLEX): HIV Screen 4th Generation wRfx: NONREACTIVE

## 2018-10-21 LAB — PROTIME-INR
INR: 1.1 (ref 0.8–1.2)
Prothrombin Time: 13.8 seconds (ref 11.4–15.2)

## 2018-10-21 LAB — MRSA PCR SCREENING: MRSA by PCR: NEGATIVE

## 2018-10-21 SURGERY — CORONARY/GRAFT ACUTE MI REVASCULARIZATION
Anesthesia: LOCAL

## 2018-10-21 MED ORDER — FENTANYL CITRATE (PF) 100 MCG/2ML IJ SOLN
INTRAMUSCULAR | Status: AC
  Filled 2018-10-21: qty 2

## 2018-10-21 MED ORDER — PANTOPRAZOLE SODIUM 40 MG IV SOLR
40.0000 mg | Freq: Two times a day (BID) | INTRAVENOUS | Status: DC
  Administered 2018-10-21 (×2): 40 mg via INTRAVENOUS
  Filled 2018-10-21 (×3): qty 40

## 2018-10-21 MED ORDER — VANCOMYCIN HCL 10 G IV SOLR
1500.0000 mg | INTRAVENOUS | Status: AC
  Administered 2018-10-22: 08:00:00 1500 mg via INTRAVENOUS
  Filled 2018-10-21: qty 1500

## 2018-10-21 MED ORDER — NITROGLYCERIN IN D5W 200-5 MCG/ML-% IV SOLN
INTRAVENOUS | Status: AC
  Filled 2018-10-21: qty 250

## 2018-10-21 MED ORDER — ASPIRIN 81 MG PO CHEW: 324.0000 mg | CHEWABLE_TABLET | Freq: Once | ORAL | Status: AC

## 2018-10-21 MED ORDER — MORPHINE SULFATE (PF) 2 MG/ML IV SOLN
2.0000 mg | Freq: Once | INTRAVENOUS | Status: AC
  Administered 2018-10-21: 2 mg via INTRAVENOUS

## 2018-10-21 MED ORDER — TIROFIBAN HCL IN NACL 5-0.9 MG/100ML-% IV SOLN
INTRAVENOUS | Status: AC
  Filled 2018-10-21: qty 100

## 2018-10-21 MED ORDER — NITROGLYCERIN IN D5W 200-5 MCG/ML-% IV SOLN: 0.0000 ug/min | INTRAVENOUS | Status: DC

## 2018-10-21 MED ORDER — ONDANSETRON HCL 4 MG/2ML IJ SOLN: 4.0000 mg | Freq: Four times a day (QID) | INTRAMUSCULAR | Status: DC | PRN

## 2018-10-21 MED ORDER — ACETAMINOPHEN 325 MG PO TABS: 650.0000 mg | ORAL_TABLET | ORAL | Status: DC | PRN

## 2018-10-21 MED ORDER — ROSUVASTATIN CALCIUM 20 MG PO TABS
40.0000 mg | ORAL_TABLET | Freq: Every day | ORAL | Status: DC
  Administered 2018-10-21 – 2018-10-27 (×6): 40 mg via ORAL
  Filled 2018-10-21 (×6): qty 2

## 2018-10-21 MED ORDER — HEPARIN SODIUM (PORCINE) 1000 UNIT/ML IJ SOLN
INTRAMUSCULAR | Status: DC | PRN
  Administered 2018-10-21: 8000 [IU] via INTRAVENOUS

## 2018-10-21 MED ORDER — ASPIRIN 300 MG RE SUPP: 300.0000 mg | RECTAL | Status: DC

## 2018-10-21 MED ORDER — DOPAMINE-DEXTROSE 3.2-5 MG/ML-% IV SOLN
0.0000 ug/kg/min | INTRAVENOUS | Status: DC
  Filled 2018-10-21: qty 250

## 2018-10-21 MED ORDER — POTASSIUM CHLORIDE 2 MEQ/ML IV SOLN
80.0000 meq | INTRAVENOUS | Status: DC
  Filled 2018-10-21: qty 40

## 2018-10-21 MED ORDER — HEPARIN (PORCINE) IN NACL 1000-0.9 UT/500ML-% IV SOLN
INTRAVENOUS | Status: DC | PRN
  Administered 2018-10-21 (×2): 500 mL

## 2018-10-21 MED ORDER — HEPARIN (PORCINE) 25000 UT/250ML-% IV SOLN
1700.0000 [IU]/h | INTRAVENOUS | Status: DC
  Administered 2018-10-21 – 2018-10-22 (×2): 1700 [IU]/h via INTRAVENOUS
  Filled 2018-10-21 (×2): qty 250

## 2018-10-21 MED ORDER — LABETALOL HCL 5 MG/ML IV SOLN: 10.0000 mg | INTRAVENOUS | Status: AC | PRN

## 2018-10-21 MED ORDER — MILRINONE LACTATE IN DEXTROSE 20-5 MG/100ML-% IV SOLN
0.3000 ug/kg/min | INTRAVENOUS | Status: DC
  Filled 2018-10-21: qty 100

## 2018-10-21 MED ORDER — MIDAZOLAM HCL 2 MG/2ML IJ SOLN
INTRAMUSCULAR | Status: AC
  Filled 2018-10-21: qty 2

## 2018-10-21 MED ORDER — CHLORHEXIDINE GLUCONATE CLOTH 2 % EX PADS
6.0000 | MEDICATED_PAD | Freq: Every day | CUTANEOUS | Status: DC
  Administered 2018-10-21: 6 via TOPICAL

## 2018-10-21 MED ORDER — IOHEXOL 350 MG/ML SOLN
INTRAVENOUS | Status: DC | PRN
  Administered 2018-10-21: 125 mL via INTRA_ARTERIAL

## 2018-10-21 MED ORDER — TEMAZEPAM 15 MG PO CAPS: 15.0000 mg | ORAL_CAPSULE | Freq: Once | ORAL | Status: DC | PRN

## 2018-10-21 MED ORDER — CHLORHEXIDINE GLUCONATE CLOTH 2 % EX PADS
6.0000 | MEDICATED_PAD | Freq: Once | CUTANEOUS | Status: AC
  Administered 2018-10-21: 6 via TOPICAL

## 2018-10-21 MED ORDER — TRANEXAMIC ACID 1000 MG/10ML IV SOLN
1.5000 mg/kg/h | INTRAVENOUS | Status: AC
  Administered 2018-10-22: 09:00:00 1.5 mg/kg/h via INTRAVENOUS
  Filled 2018-10-21: qty 25

## 2018-10-21 MED ORDER — ASPIRIN 81 MG PO CHEW: 81.0000 mg | CHEWABLE_TABLET | Freq: Every day | ORAL | Status: DC

## 2018-10-21 MED ORDER — CHLORHEXIDINE GLUCONATE 0.12 % MT SOLN
15.0000 mL | Freq: Once | OROMUCOSAL | Status: AC
  Administered 2018-10-22: 15 mL via OROMUCOSAL
  Filled 2018-10-21: qty 15

## 2018-10-21 MED ORDER — HEPARIN SODIUM (PORCINE) 5000 UNIT/ML IJ SOLN
4000.0000 [IU] | Freq: Once | INTRAMUSCULAR | Status: AC
  Administered 2018-10-21: 4000 [IU] via INTRAVENOUS
  Filled 2018-10-21: qty 1

## 2018-10-21 MED ORDER — SODIUM CHLORIDE 0.9 % IV SOLN
INTRAVENOUS | Status: DC
  Administered 2018-10-21: 01:00:00 via INTRAVENOUS

## 2018-10-21 MED ORDER — TRANEXAMIC ACID (OHS) PUMP PRIME SOLUTION
2.0000 mg/kg | INTRAVENOUS | Status: DC
  Filled 2018-10-21: qty 2.27

## 2018-10-21 MED ORDER — MORPHINE SULFATE (PF) 10 MG/ML IV SOLN: 2.0000 mg | INTRAVENOUS | Status: DC

## 2018-10-21 MED ORDER — EPINEPHRINE PF 1 MG/ML IJ SOLN
0.0000 ug/min | INTRAVENOUS | Status: DC
  Filled 2018-10-21: qty 4

## 2018-10-21 MED ORDER — SODIUM CHLORIDE 0.9 % IV SOLN: INTRAVENOUS | Status: AC

## 2018-10-21 MED ORDER — VANCOMYCIN HCL 1000 MG IV SOLR
INTRAVENOUS | Status: DC
  Filled 2018-10-21: qty 1000

## 2018-10-21 MED ORDER — SODIUM CHLORIDE 0.9 % IV SOLN
750.0000 mg | INTRAVENOUS | Status: AC
  Administered 2018-10-22: 750 mg via INTRAVENOUS
  Filled 2018-10-21: qty 750

## 2018-10-21 MED ORDER — MORPHINE SULFATE (PF) 4 MG/ML IV SOLN
INTRAVENOUS | Status: AC
  Filled 2018-10-21: qty 1

## 2018-10-21 MED ORDER — BIVALIRUDIN BOLUS VIA INFUSION - CUPID: INTRAVENOUS | Status: DC | PRN

## 2018-10-21 MED ORDER — LIDOCAINE HCL (PF) 1 % IJ SOLN
INTRAMUSCULAR | Status: AC
  Filled 2018-10-21: qty 30

## 2018-10-21 MED ORDER — CHLORHEXIDINE GLUCONATE CLOTH 2 % EX PADS
6.0000 | MEDICATED_PAD | Freq: Once | CUTANEOUS | Status: AC
  Administered 2018-10-22: 6 via TOPICAL

## 2018-10-21 MED ORDER — TRANEXAMIC ACID (OHS) BOLUS VIA INFUSION
15.0000 mg/kg | INTRAVENOUS | Status: AC
  Administered 2018-10-22: 08:00:00 1701 mg via INTRAVENOUS
  Filled 2018-10-21: qty 1701

## 2018-10-21 MED ORDER — TICAGRELOR 90 MG PO TABS
ORAL_TABLET | ORAL | Status: AC
  Filled 2018-10-21: qty 2

## 2018-10-21 MED ORDER — PLASMA-LYTE 148 IV SOLN
INTRAVENOUS | Status: DC
  Filled 2018-10-21: qty 2.5

## 2018-10-21 MED ORDER — MORPHINE SULFATE (PF) 2 MG/ML IV SOLN
INTRAVENOUS | Status: AC
  Filled 2018-10-21: qty 2

## 2018-10-21 MED ORDER — MIDAZOLAM HCL 2 MG/2ML IJ SOLN
INTRAMUSCULAR | Status: DC | PRN
  Administered 2018-10-21: 0.5 mg via INTRAVENOUS

## 2018-10-21 MED ORDER — TIROFIBAN (AGGRASTAT) BOLUS VIA INFUSION
INTRAVENOUS | Status: DC | PRN
  Administered 2018-10-21: 2835 ug via INTRAVENOUS

## 2018-10-21 MED ORDER — MORPHINE SULFATE (PF) 4 MG/ML IV SOLN
4.0000 mg | Freq: Once | INTRAVENOUS | Status: AC
  Administered 2018-10-21: 4 mg via INTRAVENOUS

## 2018-10-21 MED ORDER — SODIUM CHLORIDE 0.9% FLUSH
3.0000 mL | Freq: Two times a day (BID) | INTRAVENOUS | Status: DC
  Administered 2018-10-21 (×2): 3 mL via INTRAVENOUS

## 2018-10-21 MED ORDER — NITROGLYCERIN IN D5W 200-5 MCG/ML-% IV SOLN
INTRAVENOUS | Status: AC | PRN
  Administered 2018-10-21: 10 ug/min via INTRAVENOUS

## 2018-10-21 MED ORDER — FENTANYL CITRATE (PF) 100 MCG/2ML IJ SOLN
INTRAMUSCULAR | Status: DC | PRN
  Administered 2018-10-21: 25 ug via INTRAVENOUS

## 2018-10-21 MED ORDER — HYDRALAZINE HCL 20 MG/ML IJ SOLN: 10.0000 mg | INTRAMUSCULAR | Status: AC | PRN

## 2018-10-21 MED ORDER — CHLORHEXIDINE GLUCONATE CLOTH 2 % EX PADS: 6.0000 | MEDICATED_PAD | Freq: Every day | CUTANEOUS | Status: DC

## 2018-10-21 MED ORDER — DEXMEDETOMIDINE HCL IN NACL 400 MCG/100ML IV SOLN
0.1000 ug/kg/h | INTRAVENOUS | Status: AC
  Administered 2018-10-22: 13:00:00 .5 ug/kg/h via INTRAVENOUS
  Filled 2018-10-21: qty 100

## 2018-10-21 MED ORDER — HEPARIN (PORCINE) IN NACL 1000-0.9 UT/500ML-% IV SOLN
INTRAVENOUS | Status: AC
  Filled 2018-10-21: qty 1000

## 2018-10-21 MED ORDER — NITROGLYCERIN 0.4 MG SL SUBL: 0.4000 mg | SUBLINGUAL_TABLET | SUBLINGUAL | Status: DC | PRN

## 2018-10-21 MED ORDER — ASPIRIN 81 MG PO CHEW: 324.0000 mg | CHEWABLE_TABLET | ORAL | Status: DC

## 2018-10-21 MED ORDER — NITROGLYCERIN 1 MG/10 ML FOR IR/CATH LAB
INTRA_ARTERIAL | Status: AC
  Filled 2018-10-21: qty 10

## 2018-10-21 MED ORDER — METOPROLOL TARTRATE 25 MG PO TABS
25.0000 mg | ORAL_TABLET | Freq: Two times a day (BID) | ORAL | Status: DC
  Administered 2018-10-21 (×3): 25 mg via ORAL
  Filled 2018-10-21 (×3): qty 1

## 2018-10-21 MED ORDER — ASPIRIN EC 81 MG PO TBEC: 81.0000 mg | DELAYED_RELEASE_TABLET | Freq: Every day | ORAL | Status: DC

## 2018-10-21 MED ORDER — SODIUM CHLORIDE 0.9 % IV SOLN
INTRAVENOUS | Status: DC
  Filled 2018-10-21: qty 30

## 2018-10-21 MED ORDER — VERAPAMIL HCL 2.5 MG/ML IV SOLN
INTRAVENOUS | Status: DC | PRN
  Administered 2018-10-21: 10 mL via INTRA_ARTERIAL

## 2018-10-21 MED ORDER — SODIUM CHLORIDE 0.9% FLUSH: 3.0000 mL | INTRAVENOUS | Status: DC | PRN

## 2018-10-21 MED ORDER — MAGNESIUM SULFATE 50 % IJ SOLN
40.0000 meq | INTRAMUSCULAR | Status: DC
  Filled 2018-10-21: qty 9.85

## 2018-10-21 MED ORDER — PHENYLEPHRINE HCL-NACL 20-0.9 MG/250ML-% IV SOLN
30.0000 ug/min | INTRAVENOUS | Status: AC
  Administered 2018-10-22: 20 ug/min via INTRAVENOUS
  Administered 2018-10-22: 08:00:00 25 ug/min via INTRAVENOUS
  Filled 2018-10-21: qty 250

## 2018-10-21 MED ORDER — INSULIN REGULAR(HUMAN) IN NACL 100-0.9 UT/100ML-% IV SOLN
INTRAVENOUS | Status: AC
  Administered 2018-10-22: 08:00:00 1 [IU]/h via INTRAVENOUS
  Filled 2018-10-21: qty 100

## 2018-10-21 MED ORDER — BISACODYL 5 MG PO TBEC: 5.0000 mg | DELAYED_RELEASE_TABLET | Freq: Once | ORAL | Status: DC

## 2018-10-21 MED ORDER — NITROGLYCERIN IN D5W 200-5 MCG/ML-% IV SOLN
2.0000 ug/min | INTRAVENOUS | Status: DC
  Filled 2018-10-21: qty 250

## 2018-10-21 MED ORDER — LIDOCAINE HCL (PF) 1 % IJ SOLN
INTRAMUSCULAR | Status: DC | PRN
  Administered 2018-10-21: 5 mL via SUBCUTANEOUS

## 2018-10-21 MED ORDER — TIROFIBAN HCL IN NACL 5-0.9 MG/100ML-% IV SOLN
0.1500 ug/kg/min | INTRAVENOUS | Status: DC
  Administered 2018-10-21 (×3): 0.15 ug/kg/min via INTRAVENOUS
  Filled 2018-10-21 (×3): qty 100

## 2018-10-21 MED ORDER — SODIUM CHLORIDE 0.9 % IV SOLN
1.5000 g | INTRAVENOUS | Status: AC
  Administered 2018-10-22: 08:00:00 1.5 g via INTRAVENOUS
  Filled 2018-10-21: qty 1.5

## 2018-10-21 MED ORDER — POTASSIUM CHLORIDE CRYS ER 20 MEQ PO TBCR
20.0000 meq | EXTENDED_RELEASE_TABLET | Freq: Two times a day (BID) | ORAL | Status: DC
  Administered 2018-10-21 (×2): 20 meq via ORAL
  Filled 2018-10-21 (×2): qty 1

## 2018-10-21 MED ORDER — SODIUM CHLORIDE 0.9 % IV SOLN: 250.0000 mL | INTRAVENOUS | Status: DC | PRN

## 2018-10-21 MED ORDER — MORPHINE SULFATE (PF) 2 MG/ML IV SOLN
2.0000 mg | INTRAVENOUS | Status: AC
  Administered 2018-10-21: 2 mg via INTRAVENOUS
  Filled 2018-10-21: qty 1

## 2018-10-21 MED ORDER — TIROFIBAN HCL IN NACL 5-0.9 MG/100ML-% IV SOLN
0.1500 ug/kg/min | INTRAVENOUS | Status: DC
  Administered 2018-10-21 (×2): 0.15 ug/kg/min via INTRAVENOUS
  Filled 2018-10-21 (×2): qty 100

## 2018-10-21 MED ORDER — TIROFIBAN HCL IN NACL 5-0.9 MG/100ML-% IV SOLN
INTRAVENOUS | Status: AC | PRN
  Administered 2018-10-21: 0.15 ug/kg/min via INTRAVENOUS

## 2018-10-21 MED ORDER — VERAPAMIL HCL 2.5 MG/ML IV SOLN
INTRAVENOUS | Status: AC
  Filled 2018-10-21: qty 2

## 2018-10-21 SURGICAL SUPPLY — 13 items
CATH INFINITI 5 FR JL3.5 (CATHETERS) ×1 IMPLANT
CATH INFINITI JR4 5F (CATHETERS) ×1 IMPLANT
DEVICE RAD COMP TR BAND LRG (VASCULAR PRODUCTS) ×1 IMPLANT
GLIDESHEATH SLEND A-KIT 6F 22G (SHEATH) ×1 IMPLANT
GUIDEWIRE INQWIRE 1.5J.035X260 (WIRE) IMPLANT
INQWIRE 1.5J .035X260CM (WIRE) ×2
KIT ENCORE 26 ADVANTAGE (KITS) ×1 IMPLANT
KIT HEART LEFT (KITS) ×2 IMPLANT
PACK CARDIAC CATHETERIZATION (CUSTOM PROCEDURE TRAY) ×2 IMPLANT
SHEATH PROBE COVER 6X72 (BAG) ×1 IMPLANT
TRANSDUCER W/STOPCOCK (MISCELLANEOUS) ×2 IMPLANT
TUBING CIL FLEX 10 FLL-RA (TUBING) ×2 IMPLANT
WIRE ASAHI PROWATER 180CM (WIRE) ×1 IMPLANT

## 2018-10-21 NOTE — Progress Notes (Signed)
  Echocardiogram 2D Echocardiogram has been performed.  Oscar [name] 10/21/2018, 10:30 AM

## 2018-10-21 NOTE — Progress Notes (Signed)
[place] for Heparin Indication: multivessel CAD  Allergies  Allergen Reactions  . Acetaminophen Other (See Comments)    Bleeding issues  . Ibuprofen Other (See Comments)    Bleeding issues  . Advair Diskus [Fluticasone-Salmeterol] Cough  . Latex Itching    Patient Measurements: Height: 6\' 2"  (188 cm) Weight: 250 lb (113.4 kg) IBW/kg (Calculated) : 82.2 Heparin Dosing Weight: 110 kg  Vital Signs: Temp: 98.8 F (37.1 C) (07/22 2025) Temp Source: Oral (07/22 2025) BP: 130/89 (07/22 2100) Pulse Rate: 87 (07/22 2100)  Labs: Recent Labs    10/21/18 0021 10/21/18 0113 10/21/18 0354 10/21/18 1922  HGB 18.7* 18.4* 17.6*  --   HCT 54.3* 54.0* 52.6*  --   PLT 256  --  243  --   APTT 36  --   --   --   LABPROT 13.8  --   --   --   INR 1.1  --   --   --   HEPARINUNFRC  --   --   --  0.31  CREATININE 1.20 1.00 1.15  --   TROPONINIHS 180*  --  2,189*  --     Estimated Creatinine Clearance: 93.8 mL/min (by C-G formula based on SCr of 1.15 mg/dL).   Assessment: 58 y.o. 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  Aggrastat started post-cath also.  Heparin level is therapeutic at 0.31 on 1700 units/hr. No bleeding noted.   Goal of Therapy:  Heparin level 0.3-0.5 units/ml Monitor platelets by anticoagulation protocol: Yes   Plan:  Continue heparin drip at 1700 units/hr - off on call to OR Daily heparin level and CBC Monitor for s/sx of bleeding Aggrastat to stop on call to OR also  Thank you for involving pharmacy in this patient's care.  Renold Genta, PharmD, BCPS Clinical Pharmacist Clinical phone for 10/21/2018 until 10:30p is [phone] 10/21/2018 9:42 PM  **Pharmacist phone directory can be found on [email or web address] listed under [place]**

## 2018-10-21 NOTE — Progress Notes (Signed)
TCTS consulted for CABG evaluation. °

## 2018-10-21 NOTE — Consult Note (Signed)
[address]       [place] [address]             [phone]        Khanh Gouin [place] Medical Record #[id number] Date of Birth: [birth year]  Referring: No ref. provider found Primary Care: Derinda Late, MD Primary Cardiologist:Brian Stanford Breed, MD  Chief Complaint:   Stuttering chest pain for prior 3 days Chief Complaint  Patient presents with  . Code STEMI    History of Present Illness:    58 yo very active, previously healthy man began to experience precordial chest pain this past weekend.  He was able to exercise with his normal routine.  Last night, he experienced increased severity of pain that began to radiate to his back and neck.  This prompted 911 call.  He was transferred to [facility] emergency department where a STEMI was diagnosed by EKG.  He was taken to the Cath Lab and by the time the images were performed a suspected thrombus had resolved.  However he did have evidence for multivessel coronary artery disease.  Consult was now obtained for consideration of CABG.  He has been hemodynamically stable.  He denies chest pain at present.   Current Activity/ Functional Status: Patient is independent with mobility/ambulation, transfers, ADL's, IADL's.   Zubrod Score: At the time of surgery this patient's most appropriate activity status/level should be described as: []     0    Normal activity, no symptoms []     1    Restricted in physical strenuous activity but ambulatory, able to do out light work []     2    Ambulatory and capable of self care, unable to do work activities, up and about                 more than 50%  Of the time                            []     3    Only limited self care, in bed greater than 50% of waking hours []     4    Completely disabled, no self care, confined to bed or chair []     5    Moribund  Past Medical History:  Diagnosis Date  . Adenomatous polyp   . Chickenpox   . 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 Diverticulitis   . Hepatitis A    as a child, no current liver problems  . Hyperlipidemia   . Hypertension    he also sees Dr. Lynita Lombard in [place], [place]   . Lower GI bleed    Hx: of  . Nephrolithiasis    2-3 kidney stones in past  . Obesity   . Pneumonia   . Prediabetes   . Sleep apnea 2003   CPAP machine, pt does not know settings    Past Surgical History:  Procedure Laterality Date  . APPENDECTOMY   10-2yrs ago  . CLOSED REDUCTION FINGER WITH PERCUTANEOUS PINNING Left 04/22/2013   Procedure: LEFT WRIST CLOSED REDUCTION CONVERTED TO OPEN REDUCTION INTERNAL FIXATION, LEFT CARPAL TUNNEL RELEASE;  Surgeon: Linna Hoff, MD;  Location: [facility];  Service: Orthopedics;  Laterality: Left;  . COLONOSCOPY  07-09-09   benign polyp repeat 5 years, Dr.Sam Penelope Coop  . CORONARY/GRAFT ACUTE MI REVASCULARIZATION N/A 10/21/2018   Procedure: Coronary/Graft Acute MI Revascularization;  Surgeon: Belva Crome,  MD;  Location: [facility];  Service: Cardiovascular;  Laterality: N/A;  . HERNIA REPAIR     repaired x 4  . INSERTION OF MESH  04/09/2012   Procedure: INSERTION OF MESH;  Surgeon: Earnstine Regal, MD;  Location: [facility];  Service: General;  Laterality: N/A;  . LEFT HEART CATH AND CORONARY ANGIOGRAPHY N/A 10/21/2018   Procedure: LEFT HEART CATH AND CORONARY ANGIOGRAPHY;  Surgeon: Belva Crome, MD;  Location: [facility];  Service: Cardiovascular;  Laterality: N/A;  . TONSILLECTOMY  as child  . UVULECTOMY  yrs ago   for snoring  . VENTRAL HERNIA REPAIR  04/09/2012   Procedure: LAPAROSCOPIC VENTRAL HERNIA;  Surgeon: Earnstine Regal, MD;  Location: [facility];  Service: General;  Laterality: N/A;  Laparoscopic Ventral Incisional Hernia Repair with Mesh    Social History   Tobacco Use  Smoking Status Never Smoker  Smokeless Tobacco Never Used    Social History   Substance and Sexual Activity  Alcohol Use Yes  . Alcohol/week: 0.0 standard drinks   Comment: 2-3 glasses 2-3 times a  week-wine; occasional liquior drinks     Allergies  Allergen Reactions  . Acetaminophen Other (See Comments)    Bleeding issues  . Ibuprofen Other (See Comments)    Bleeding issues  . Advair Diskus [Fluticasone-Salmeterol] Cough  . Latex Itching    Current Facility-Administered Medications  Medication Dose Route Frequency Provider Last Rate Last Dose  . 0.9 %  sodium chloride infusion   Intravenous Continuous Delora Fuel, MD 20 mL/hr at 10/21/18 0032    . 0.9 %  sodium chloride infusion  250 mL Intravenous PRN Belva Crome, MD      . acetaminophen (TYLENOL) tablet 650 mg  650 mg Oral Q4H PRN Belva Crome, MD      . Derrill Memo ON 10/22/2018] aspirin EC tablet 81 mg  81 mg Oral Daily Milus Banister, MD      . Chlorhexidine Gluconate Cloth 2 % PADS 6 each  6 each Topical Daily Leonie Man, MD      . heparin ADULT infusion 100 units/mL (25000 units/293mL sodium chloride 0.45%)  1,700 Units/hr Intravenous Continuous Belva Crome, MD      . metoprolol tartrate (LOPRESSOR) tablet 25 mg  25 mg Oral BID Belva Crome, MD   25 mg at 10/21/18 1147  . nitroGLYCERIN (NITROSTAT) SL tablet 0.4 mg  0.4 mg Sublingual Q5 Min x 3 PRN Milus Banister, MD      . nitroGLYCERIN 50 mg in dextrose 5 % 250 mL (0.2 mg/mL) infusion  0-200 mcg/min Intravenous Titrated Belva Crome, MD 16.5 mL/hr at 10/21/18 0500 55 mcg/min at 10/21/18 0500  . ondansetron (ZOFRAN) injection 4 mg  4 mg Intravenous Q6H PRN Belva Crome, MD      . pantoprazole (PROTONIX) injection 40 mg  40 mg Intravenous Q12H Milus Banister, MD   40 mg at 10/21/18 0510  . potassium chloride SA (K-DUR) CR tablet 20 mEq  20 mEq Oral BID Leonie Man, MD   20 mEq at 10/21/18 1146  . rosuvastatin (CRESTOR) tablet 40 mg  40 mg Oral Daily Milus Banister, MD   40 mg at 10/21/18 1147  . sodium chloride flush (NS) 0.9 % injection 3 mL  3 mL Intravenous Q12H Belva Crome, MD   3 mL at 10/21/18 0510  . sodium chloride flush (NS) 0.9 %  injection 3  mL  3 mL Intravenous PRN Belva Crome, MD      . tirofiban (AGGRASTAT) infusion 50 mcg/mL 100 mL  0.15 mcg/kg/min Intravenous Continuous Leonie Man, MD 20.4 mL/hr at 10/21/18 1148 0.15 mcg/kg/min at 10/21/18 1148    Medications Prior to Admission  Medication Sig Dispense Refill Last Dose  . acidophilus (RISAQUAD) CAPS capsule Take 1 capsule by mouth 2 (two) times daily.   10/20/2018 at Unknown time  . ALPRAZolam (XANAX) 1 MG tablet Take 1 mg by mouth at bedtime as needed for sleep.   Past Week at Unknown time  . amLODipine (NORVASC) 5 MG tablet Take 5 mg by mouth daily.   10/20/2018 at Unknown time  . Anastrozole (ARIMIDEX PO) Take 0.25 mg by mouth See admin instructions. MON and THUR   Past Week at Unknown time  . DHEA 50 MG CAPS Take 50 mg by mouth daily.   10/20/2018 at Unknown time  . ezetimibe (ZETIA) 10 MG tablet Take 10 mg by mouth daily.   10/20/2018 at Unknown time  . hydrochlorothiazide (HYDRODIURIL) 25 MG tablet Take 25 mg by mouth daily.    10/20/2018 at Unknown time  . metFORMIN (GLUCOPHAGE) 500 MG tablet Take 1,000 mg by mouth 2 (two) times daily. Pt takes one tablet in the morning and two at night.   10/20/2018 at Unknown time  . rosuvastatin (CRESTOR) 40 MG tablet Take 1 tablet (40 mg total) by mouth daily. (Patient taking differently: Take 20 mg by mouth daily. ) 90 tablet 3 10/20/2018 at Unknown time  . Turmeric (QC TUMERIC COMPLEX PO) Take 1 capsule by mouth daily.   10/20/2018 at Unknown time  . vitamin C (ASCORBIC ACID) 500 MG tablet Take 1,000 mg by mouth 2 (two) times daily.   10/20/2018 at Unknown time    Family History  Problem Relation Age of Onset  . Stroke Mother        late 29s  . CAD Mother        MI in her 45s  . CAD Father 13  . Marfan syndrome Brother   . Diabetes Brother   . Hypertension Brother   . Obesity Brother   . Colon cancer Neg Hx      Review of Systems:   ROS A comprehensive review of systems was negative except for: sleep  apnea and several abdominal hernia surgeries remotely     Cardiac Review of Systems: Y or  [    ]= no  Chest Pain [    ]  Resting SOB [   ] Exertional SOB  [  ]  Orthopnea [  ]   Pedal Edema [   ]    Palpitations [  ] Syncope  [  ]   Presyncope [   ]  General Review of Systems: [Y] = yes [  ]=no Constitional: recent weight change [  ]; anorexia [  ]; fatigue [  ]; nausea [  ]; night sweats [  ]; fever [  ]; or chills [  ]                                                               Dental: Last Dentist visit:   Eye : blurred vision [  ]; diplopia [   ];  vision changes [  ];  Amaurosis fugax[  ]; Resp: cough [  ];  wheezing[  ];  hemoptysis[  ]; shortness of breath[  ]; paroxysmal nocturnal dyspnea[  ]; dyspnea on exertion[  ]; or orthopnea[  ];  GI:  gallstones[  ], vomiting[  ];  dysphagia[  ]; melena[  ];  hematochezia [  ]; heartburn[  ];   Hx of  Colonoscopy[  ]; GU: kidney stones [  ]; hematuria[  ];   dysuria [  ];  nocturia[  ];  history of     obstruction [  ]; urinary frequency [  ]             Skin: rash, swelling[  ];, hair loss[  ];  peripheral edema[  ];  or itching[  ]; Musculosketetal: myalgias[  ];  joint swelling[  ];  joint erythema[  ];  joint pain[  ];  back pain[  ];  Heme/Lymph: bruising[  ];  bleeding[  ];  anemia[  ];  Neuro: TIA[  ];  headaches[  ];  stroke[  ];  vertigo[  ];  seizures[  ];   paresthesias[  ];  difficulty walking[  ];  Psych:depression[  ]; anxiety[  ];  Endocrine: diabetes[  ];  thyroid dysfunction[  ];            unknown            unknown      Physical Exam: BP 116/61   Pulse 81   Temp 98.7 F (37.1 C) (Oral)   Resp 19   Ht 6\' 2"  (1.88 m)   Wt 113.4 kg   SpO2 (!) 86%   BMI 32.10 kg/m    General appearance: alert, cooperative and no distress Head: Normocephalic, without obvious abnormality, atraumatic Resp: clear to auscultation bilaterally Back: negative, symmetric, no curvature. ROM normal. No CVA tenderness. Cardio: regular rate  and rhythm, S1, S2 normal, no murmur, click, rub or gallop GI: soft, non-tender; bowel sounds normal; no masses,  no organomegaly Extremities: extremities normal, atraumatic, no cyanosis or edema Neurologic: Grossly normal  Diagnostic Studies & Laboratory data:     Recent Radiology Findings:   No results found.   I have independently reviewed the above radiologic studies and discussed with the patient   Recent Lab Findings: Lab Results  Component Value Date   WBC 11.5 (H) 10/21/2018   HGB 17.6 (H) 10/21/2018   HCT 52.6 (H) 10/21/2018   PLT 243 10/21/2018   GLUCOSE 146 (H) 10/21/2018   CHOL 98 10/21/2018   TRIG 160 (H) 10/21/2018   HDL 34 (L) 10/21/2018   LDLCALC 32 10/21/2018   ALT 24 10/21/2018   AST 40 10/21/2018   NA 138 10/21/2018   K 3.3 (L) 10/21/2018   CL 102 10/21/2018   CREATININE 1.15 10/21/2018   BUN 16 10/21/2018   CO2 27 10/21/2018   INR 1.1 10/21/2018   HGBA1C 6.0 (H) 10/21/2018      Assessment / Plan:  Pleasant 58 yo man with strong family history and recent STEMI. Agree with suggestion for CABG based on degree of severity and diabetic status. Lean towards all-arterial revascularization. Plan for surgery on 10/22/18.        I  spent 40 minutes counseling the patient face to face.  Mahkayla Preece Z. Orvan Seen, MD 10/21/2018 11:53 AM

## 2018-10-21 NOTE — Progress Notes (Signed)
Pre cabg has been completed.   Preliminary results in CV Proc.   Abram Sander 10/21/2018 3:08 PM

## 2018-10-21 NOTE — ED Triage Notes (Signed)
Pt in POV reporting central CP X2 days, sharp, non radiating. Pt appears to be in discomfort in triage. Given 324 ASA, 3 NTG en route.

## 2018-10-21 NOTE — Progress Notes (Signed)
   10/21/18 0100  Clinical Encounter Type  Visited With Patient;Family;Health care provider  Visit Type Initial;Follow-up;Psychological support;Code;ED;Pre-op  Referral From Other (Comment) (code stemi)  Spiritual Encounters  Spiritual Needs Emotional  Stress Factors  Patient Stress Factors Loss of control;Health changes  Family Stress Factors Loss of control   Spoke w/ pt, wanted me to tell his wife that he loved her very much.  Thought she was in the ED lobby, but she was in the car.  Gave contact info to dr, who called w/ pt's permission.  I f/u with call to pt's spouse.  She mentioned that he has had negative experiences in hospital before and knew he would be very nervous.  She also said pt has sleep apnea and she left his CPAP at the ED front desk.  I let dr know.  Communicated messages to pt.  Myra Gianotti resident, [phone]

## 2018-10-21 NOTE — Progress Notes (Signed)
Progress Note  Patient Name: Oscar [name] Date of Encounter: 10/21/2018  Primary Cardiologist: Kirk Ruths, MD   Subjective   Has a headache mostly.  Also has a little tightness in his chest, but not the pressure that he had last night. No dyspnea.  No edema.  Inpatient Medications    Scheduled Meds: . [START ON 10/22/2018] aspirin EC  81 mg Oral Daily  . metoprolol tartrate  25 mg Oral BID  . pantoprazole (PROTONIX) IV  40 mg Intravenous Q12H  . rosuvastatin  40 mg Oral Daily  . sodium chloride flush  3 mL Intravenous Q12H   Continuous Infusions: . sodium chloride 20 mL/hr at 10/21/18 0032  . sodium chloride    . heparin    . nitroGLYCERIN 55 mcg/min (10/21/18 0500)  . tirofiban 0.15 mcg/kg/min (10/21/18 0758)   PRN Meds: sodium chloride, acetaminophen, hydrALAZINE, labetalol, nitroGLYCERIN, ondansetron (ZOFRAN) IV, sodium chloride flush   Vital Signs    Vitals:   10/21/18 0530 10/21/18 0600 10/21/18 0630 10/21/18 0749  BP: 117/71 112/74 122/73   Pulse: 75 75 83   Resp: 15 15 17    Temp:    98.1 F (36.7 C)  TempSrc:    Axillary  SpO2: 91% 92% 91%   Weight:      Height:        Intake/Output Summary (Last 24 hours) at 10/21/2018 0837 Last data filed at 10/21/2018 0500 Gross per 24 hour  Intake 404.54 ml  Output 600 ml  Net -195.46 ml   Last 3 Weights 10/21/2018 12/05/2016 05/20/2016  Weight (lbs) 250 lb 277 lb 278 lb  Weight (kg) 113.399 kg 125.646 kg 126.1 kg      Telemetry    Sinus rhythm with rates in the 60s to 80s- Personally Reviewed  ECG    Sinus rhythm with first-degree AV block (PR interval 218).  Rate 74 bpm.  Left axis deviation/LAFB (-53).  Inferior ST elevations no longer present- Personally Reviewed  Physical Exam   GEN:  Resting in bed, has a headache, but no obvious distress Neck: No JVD or carotid bruit Cardiac: RRR, no murmurs, rubs, or gallops.  Normal S1 and S2 Respiratory: Clear to auscultation bilaterally.  Nonlabored, good  air movement GI: Soft, nontender, non-distended  MS: No edema; No deformity. Neuro:  Nonfocal  Psych: Normal affect   Labs    High Sensitivity Troponin:   Recent Labs  Lab 10/21/18 0021 10/21/18 0354  TROPONINIHS 180* 2,189*      Cardiac EnzymesNo results for input(s): TROPONINI in the last 168 hours. No results for input(s): TROPIPOC in the last 168 hours.   Chemistry Recent Labs  Lab 10/21/18 0021 10/21/18 0354  NA 137 138  K 3.5 3.3*  CL 99 102  CO2 25 27  GLUCOSE 196* 146*  BUN 16 16  CREATININE 1.20 1.15  CALCIUM 9.9 9.3  PROT 7.8  --   ALBUMIN 4.1  --   AST 40  --   ALT 24  --   ALKPHOS 63  --   BILITOT 0.5  --   GFRNONAA >60 >60  GFRAA >60 >60  ANIONGAP 13 9     Hematology Recent Labs  Lab 10/21/18 0021 10/21/18 0354  WBC 10.2 11.5*  RBC 6.19* 5.90*  HGB 18.7* 17.6*  HCT 54.3* 52.6*  MCV 87.7 89.2  MCH 30.2 29.8  MCHC 34.4 33.5  RDW 13.9 14.0  PLT 256 243    BNPNo results for input(s): BNP, PROBNP  in the last 168 hours.   DDimer No results for input(s): DDIMER in the last 168 hours.   Radiology    No results found.  Cardiac Studies    Cardiac Cath 10/21/2018: 90% ulcerated proximal LAD, 70% D1 and D2.  Likely culprit lesion 99% thrombotic mid-distal circumflex, proximal RCA 80% with mid RCA 30 and 60% followed by 75% and PDA.  EF estimated 50 to 55% with inferoapical hypokinesis  TTE pending  Patient Profile     58 y.o. male with very strong family history of premature CAD as well as DM-2, hypertension hyperlipidemia as well as prior GI bleed with multiple GI surgeries who presented overnight 721-22 with for 2-day history of stuttering chest pain found to have EKG upon arrival to ER with inferolateral ST elevation that subsequently resolved during cardiac catheterization which performed found finding multivessel CAD.  Essentially he aborted his MI with the next EKG showing resolved elevations. He had had a history of coronary CT  performed in 08/2017. That demonstrated 3V CAD with a focal 70% pLAD lesion and distal LCx. There was a 50% narrowing of the RCA. CT-FFR was performed on the mLAD lesion (prox 0.87, dist 0.79) and the LCx lesion (prox 0.85 dist 0.75). The RCA was not FFR'd due to motion artifact.   Cardiac cath revealed likely culprit lesion being thrombotic lesion in the Circumflex with distorted flow along with significant disease in the LAD and RCA.  He was started on Aggrastat and nitroglycerin and brought to the CCU with plans for CVTS consultation given his history of GI bleed there is concern for need for DAPT with multivessel PCI.Marland Kitchen  Assessment & Plan    Principal Problem:   Acute ST elevation myocardial infarction (STEMI) of inferolateral wall (HCC) - Aborted Active Problems:   CAD, multiple vessel   Hyperlipidemia associated with type 2 diabetes mellitus ([place])   Essential hypertension   Diverticular hemorrhage - recurrent   Diabetes mellitus type 2 with complications ([place])   Sleep apnea   Principal Problem:   Acute ST elevation myocardial infarction (STEMI) of inferolateral wall (HCC) - Aborted/  CAD, multiple vessel --Following troponin levels, elevated but not to the extent that one would expect with STEMI  Currently on heparin drip plus Aggrastat and nitroglycerin.  We will try to wean nitroglycerin drip as he has a headache as long as no chest pain.  PRN Tylenol for chest pain if that is not adequate, would use morphine.  Monitor for signs of bleeding.  On aspirin (was never given Plavix or Brilinta)    Hyperlipidemia associated with type 2 diabetes mellitus ([place])  Previous lipid checks have been pretty well controlled on rosuvastatin.  Continue current dose.    Essential hypertension --> on low-dose metoprolol with adequate blood pressure while on nitroglycerin.  Consider starting ARB prior to surgery.    Diverticular hemorrhage - recurrent --> concern for the potential need for dual  antiplatelet therapy given his history of bleed.  This plus being diabetic would make CABG a better option for multivessel disease.  On PPI for prophylaxis    Diabetes mellitus type 2 with complications ([place])  Was on metformin at home.  Currently on sliding scale insulin  Would likely benefit from consideration of dapagliflozin or empagliflozin either prior to her discharge.    Sleep apnea --> using home CPAP machine    Hypokalemia-replete potassium    For questions or updates, please contact [place] Please consult [email or web address] for contact info under  Signed, Glenetta Hew, MD  10/21/2018, 8:37 AM

## 2018-10-21 NOTE — H&P (Signed)
Cardiology Admission History and Physical:   Patient ID: Oscar [name] MRN: [id number]; DOB: 1960/07/17   Admission date: 10/21/2018  Primary Care Provider: Derinda Late, MD Primary Cardiologist: No primary care provider on file.  Primary Electrophysiologist:  None   Chief Complaint:  Chest pain  Patient Profile:   Oscar [name] is a 58 y.o. male with a strong family history of premature coronary artery disease, DM2, HTN, HLD, and prior GI bleed who presents with 2 days of stuttering chest pain found to have an inferolateral STEMI.   History of Present Illness:   Oscar [name] has been followed cardiology at [facility] given his risk factors for CAD. He has been aggressively treated for these modifiable risk factors with an HbA1c < 6, an LDL of 40. He has had excellent exercise tolerance largely without limitation - even doing over an hour of moderate to high intensity cycling this weekend without limitation. Over the last two days, however, he reports "just not feeling right" with some sharp, stuttering chest pains. This evening, he had a particularly severe episode for which EMS was activated and he was taken to the [facility]. Interestingly, EMS tracing show STE only in II, but upon arrival to the ER 12 lead EKG notable for prominent STE in inferior (II, III, AVF) leads with reciprocal ST depressions anteriorly. He was hemodynamically stable (BP 160/80, HR 84) with ongoing chest pain.   Of note, patient had a coronary CT performed in 08/2017. That demonstrated 3V CAD with a focal 70% pLAD lesion and distal LCx. There was a 50% narrowing of the RCA. CT-FFR was performed on the mLAD lesion (prox 0.87, dist 0.79) and the LCx lesion (prox 0.85 dist 0.75). The RCA was not FFR'd due to motion artifact.   Regarding his history of GI bleed, patient had recurrent diverticular bleed in 2017 / 2018. ASA had previously been held but he had been given the ok to restart. The patient denies any recent issues with  melena or hematochezia.   Past Medical History:  Diagnosis Date  . Adenomatous polyp   . Chickenpox   . 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 Diverticulitis   . Hepatitis A    as a child, no current liver problems  . Hyperlipidemia   . Hypertension    he also sees Dr. Lynita Lombard in [place], [place]   . Lower GI bleed    Hx: of  . Nephrolithiasis    2-3 kidney stones in past  . Obesity   . Pneumonia   . Prediabetes   . Sleep apnea 2003   CPAP machine, pt does not know settings    Past Surgical History:  Procedure Laterality Date  . APPENDECTOMY   10-68yrs ago  . CLOSED REDUCTION FINGER WITH PERCUTANEOUS PINNING Left 04/22/2013   Procedure: LEFT WRIST CLOSED REDUCTION CONVERTED TO OPEN REDUCTION INTERNAL FIXATION, LEFT CARPAL TUNNEL RELEASE;  Surgeon: Linna Hoff, MD;  Location: [facility];  Service: Orthopedics;  Laterality: Left;  . COLONOSCOPY  07-09-09   benign polyp repeat 5 years, Dr.Sam Penelope Coop  . HERNIA REPAIR     repaired x 4  . INSERTION OF MESH  04/09/2012   Procedure: INSERTION OF MESH;  Surgeon: Earnstine Regal, MD;  Location: [facility];  Service: General;  Laterality: N/A;  . TONSILLECTOMY  as child  . UVULECTOMY  yrs ago   for snoring  . VENTRAL HERNIA REPAIR  04/09/2012   Procedure: LAPAROSCOPIC VENTRAL HERNIA;  Surgeon: Earnstine Regal, MD;  Location: [facility];  Service: General;  Laterality: N/A;  Laparoscopic Ventral Incisional Hernia Repair with Mesh     Medications Prior to Admission: Prior to Admission medications   Medication Sig Start Date End Date Taking? Authorizing Provider  acidophilus (RISAQUAD) CAPS capsule Take 1 capsule by mouth 2 (two) times daily.    [provider]  amLODipine (NORVASC) 5 MG tablet Take 5 mg by mouth daily.    [provider]  B Complex-C (B-COMPLEX WITH VITAMIN C) tablet Take 1 tablet by mouth daily.    [provider]  DHEA 50 MG CAPS Take 50 mg by mouth daily.    [provider]   hydrochlorothiazide (HYDRODIURIL) 25 MG tablet Take 25 mg by mouth daily.     [provider]  metFORMIN (GLUCOPHAGE) 500 MG tablet Take 500-1,000 mg by mouth 2 (two) times daily. Pt takes one tablet in the morning and two at night.    [provider]  rosuvastatin (CRESTOR) 40 MG tablet Take 1 tablet (40 mg total) by mouth daily. 10/24/15   Lelon Perla, MD  vitamin C (ASCORBIC ACID) 500 MG tablet Take 1,000 mg by mouth 2 (two) times daily.    [provider]  zolpidem (AMBIEN) 5 MG tablet Take 5 mg by mouth at bedtime as needed for sleep.    [provider]     Allergies:    Allergies  Allergen Reactions  . Advair Diskus [Fluticasone-Salmeterol] Cough    Social History:   Social History   Socioeconomic History  . Marital status: Married    Spouse name: Baxter Flattery  . Number of children: 4  . Years of education: 52  . Highest education level: Not on file  Occupational History  . Occupation: Self employed: Owns Programmer, multimedia: [employer] [place]  Social Needs  . Financial resource strain: Not on file  . Food insecurity    Worry: Not on file    Inability: Not on file  . Transportation needs    Medical: Not on file    Non-medical: Not on file  Tobacco Use  . Smoking status: Never Smoker  . Smokeless tobacco: Never Used  Substance and Sexual Activity  . Alcohol use: Yes    Alcohol/week: 0.0 standard drinks    Comment: 2-3 glasses 2-3 times a week-wine; occasional liquior drinks  . Drug use: No  . Sexual activity: Never  Lifestyle  . Physical activity    Days per week: Not on file    Minutes per session: Not on file  . Stress: Not on file  Relationships  . Social Herbalist on phone: Not on file    Gets together: Not on file    Attends religious service: Not on file    Active member of club or organization: Not on file    Attends meetings of clubs or organizations: Not on file    Relationship  status: Not on file  . Intimate partner violence    Fear of current or ex partner: Not on file    Emotionally abused: Not on file    Physically abused: Not on file    Forced sexual activity: Not on file  Other Topics Concern  . Not on file  Social History Narrative   Married.  Lives with wife.  Independent of ADLs.   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 There is no history of Colon cancer.    Review of Systems: [y] = yes, [ ]  = no     General: Weight gain [ ] ; Weight loss [ ] ; Anorexia [ ] ; Fatigue [ ] ; Fever [ ] ; Chills [ ] ; Weakness [ ]    Cardiac: Chest pain/pressure [y]; Resting SOB [ ] ; Exertional SOB [ ] ; Orthopnea [ ] ; Pedal Edema [ ] ; Palpitations [ ] ; Syncope [ ] ; Presyncope [ ] ; Paroxysmal nocturnal dyspnea[ ]    Pulmonary: Cough [ ] ; Wheezing[ ] ; Hemoptysis[ ] ; Sputum [ ] ; Snoring [ ]    GI: Vomiting[ ] ; Dysphagia[ ] ; Melena[ ] ; Hematochezia [ ] ; Heartburn[ ] ; Abdominal pain [ ] ; Constipation [ ] ; Diarrhea [ ] ; BRBPR [ ]    GU: Hematuria[ ] ; Dysuria [ ] ; Nocturia[ ]    Vascular: Pain in legs with walking [ ] ; Pain in feet with lying flat [ ] ; Non-healing sores [ ] ; Stroke [ ] ; TIA [ ] ; Slurred speech [ ] ;   Neuro: Headaches[ ] ; Vertigo[ ] ; Seizures[ ] ; Paresthesias[ ] ;Blurred vision [ ] ; Diplopia [ ] ; Vision changes [ ]    Ortho/Skin: Arthritis [ ] ; Joint pain [ ] ; Muscle pain [ ] ; Joint swelling [ ] ; Back Pain [ ] ; Rash [ ]    Psych: Depression[ ] ; Anxiety[ ]    Heme: Bleeding problems [y]; Clotting disorders [ ] ; Anemia [ ]    Endocrine: Diabetes [ ] ; Thyroid dysfunction[ ]   Physical Exam/Data:   Vitals:   10/21/18 0019 10/21/18 0022 10/21/18 0023 10/21/18 0030  BP:  (!) 153/87  (!) 157/90  Pulse:   90 85  Resp:  (!) 32 (!) 35 (!) 31   SpO2:   99% 100%  Weight: 113.4 kg     Height: 6\' 2"  (1.88 m)      No intake or output data in the 24 hours ending 10/21/18 0054 Filed Weights   10/21/18 0019  Weight: 113.4 kg   Body mass index is 32.1 kg/m.  General:  Obese caucasian man, in distress.  HEENT: normal Lymph: no adenopathy Neck: no JVD Endocrine:  No thryomegaly Vascular: No carotid bruits; FA pulses 2+ bilaterally without bruits  Cardiac:  normal S1, S2; RRR; no murmur. Lungs:  clear to auscultation bilaterally, no wheezing, rhonchi or rales  Abd: soft, nontender, no hepatomegaly  Ext: no edema Musculoskeletal:  No deformities, BUE and BLE strength normal and equal Skin: warm and dry  Neuro:  CNs 2-12 intact, no focal abnormalities noted Psych:  Normal affect   EKG:  The ECG that was done revealed 30mm STE in inferior leads with reciprocal changes anteriorly.   Relevant CV Studies: CT Coronary 08/2017: Findings: Coronary Arteries:  There is co dominance of the coronary arterial circulation with at least 2 posterolateral branches arising from the left circumflex.The coronary artery ostia are in normal location.  Left Main: Course: Normal, trifurcation with small ramus intermedius coursing toward the lateral left ventricular wall. Size: Normal Atherosclerosis: Mixed soft and calcified plaque Stenosis: Less than 30%  Left Anterior Descending: Course: Short segment myocardial bridging in the mid LAD. Size: Normal Atherosclerosis: Mixed soft and calcified plaque Stenosis: Focal high grade stenosis measuring approximately 70% in the proximal LAD, just proximal to take off of the D1 branch.  Diagonals: Course: Normal; only D1 is partially seen.  Atherosclerosis: Mixed soft and calcified  plaque Stenosis: Less than 30%  Left Circumflex: Course: Normal Size: Normal Atherosclerosis: Mixed soft and calcified plaque Stenosis: Focal high grade stenosis measuring approximately 70% in the distal most  LCx, just proximal to the origin of the first posterolateral branch.  Obtuse Marginals Course: Normal Size: Normal Atherosclerosis: Scattered eccentric calcified plaque Stenosis: Less than 30%  Right Coronary Artery: Course: Normal Size: Normal Atherosclerosis: Mixed soft and calcified plaque greatest at the level of the right AV groove Stenosis: Focal stenosis measuring approximately 50% at the level of the right AV groove. Some of this is likely artifactual.  Chamber Morphology: Left Ventricle:  Chamber size: Normal Chamber morphology: Normal wall thickness  Left Atrium: Chamber size: Normal Chamber morphology: No mass/thrombus  Valves: Aortic Valve: Morphologically tricuspid.   CT Chest: Partially visualized central airways are patent. Small pulmonary nodule in the left lower lobe measuring 3 mm (series 9, image 139). Minimal partial basilar atelectasis. No pleural effusions. Partially visualized upper abdomen is unremarkable. No aggressive osseous lesions.  Impression: 1. Normal origin of coronary arteries.  2. Three vessel coronary artery atherosclerosis. Focal high grade stenosis of approximately 70% in the proximal left anterior descending and distal left circumflex, caused by non-calcified and calcified plaque. CAD-RADS 4A.  3. Apparent focal narrowing of approximately 50% in the right coronary artery, at the level of the right AV groove. Some of this is likely artifactual due to motion and step artifact.  4. Focal high grade narrowing in the distalmost LCx of approximately 70%, just prior to the takeoff of the first posterolateral branch.  4. Scattered small pulmonary nodules measuring 3 mm. If the patient is low risk for lung cancer, no further follow-up is recommended.If the patient is high risk for lung cancer, consider 12 month follow-up CT. (2017 Fleischner Guidelines)  CT-FFR Findings/Values:  Left Anterior Descending System: 0.87 proximal,  0.79 distal Left Circumflex System: 0.85 proximal, 0.75 distal Right Coronary System: unable to be processed due to motion artifact  Impression:  Potentially hemodynamically significant (flow-limiting) stenosis involving the distal left circumflex artery. The right coronary artery could not be processed due to motion artifact. Consider additional testing.   Stress Echo 08/2017: STRESS ECHOCARDIOGRAPHY ------------------------------------------------------       Protocol: Treadmill (Modified Bruce)       Drugs: None Target Heart Rate: 138 Maximum Predicted Heart Rate: 163    Resting ECG: Normal   Protocol Note: DEFINITY LOT#6226, 69mL, IVP  TYPE   STAGE  TIME  HR  BP  RPE SPO2 COMMENTS -------- ----- --------- --- ------- ----- ---- ------------------------------ Baseline         81 142/ 83 Stress   1  178 sec. 96 128/ 93 7/20 Stress   2  185 sec. 106 113/ 90 11/20 Stress   3  181 sec. 117 151/ 74 13/20 Stress   4   64 sec. 121  / Stress   5  123 sec. 129 165/105      CALF PAIN 3/10 Stress   6  135 sec. 137 171/119 18/20   CALF PAIN 18/20 Recovery  1   1 min. 125 142/100 Recovery  2   2 min. 115 181/ 68 Recovery  3   4 min. 103 166/ 70 Recovery  4   6 min. 102 148/ 70 Recovery  5   8 min. 101 135/ 74   Stress Duration: 14.43 minutes. Max Stress H.R: 137  Target Heart Rate (138) Achieved: No  Max. workload of 11.30 METs  was achieved during exercise.    HR Response: Appropriate    BP Response: Resting hypertension - exaggerated response  WALL SEGMENT FINDINGS --------------------------------------------------------              Rest      Stress             --------------- --------------- Anterior Septum Basal: Normal     Hyperkinetic          Mid: Normal     Hyperkinetic     Apical Septum: Normal     Hyperkinetic    Anterior Wall Basal: Normal     Hyperkinetic          Mid: Normal     Hyperkinetic         Apical: Normal     Hyperkinetic    Lateral Wall Basal: Normal     Hyperkinetic          Mid: Normal     Hyperkinetic         Apical: Normal     Hyperkinetic   Posterior Wall Basal: Normal     Hyperkinetic          Mid: Normal     Hyperkinetic   Inferior Wall Basal: Normal     Hyperkinetic          Mid: Normal     Hyperkinetic         Apical: Normal     Hyperkinetic  Inferior Septum Basal: Normal     Hyperkinetic          Mid: Normal     Hyperkinetic            EF: >55%      >55%       Rest      Low Stress   Peak      Recovery    MR: MILD MR LVOT Vel:  ADDITIONAL FINDINGS ----------------------------------------------------------   Chest Discomfort: None     Arrhythmia: None Termination Reason: leg discomfort    Complications: None  STRESS ECG RESULTS -----------------------------------------------------------   ECG Results: Normal. .  INTERPRETATION ---------------------------------------------------------------   INDETERMINATE.  VALVULAR REGURGITATION: MILD MR, TRIVIAL TR  NO VALVULAR STENOSIS  Note: NORMAL RESTING STUDY WITH NO WALL MOTION ABNORMALITIES AT REST AND  PEAK STRESS (120's-132 BPM during stress imaging)  MILDLY THICKENED AORTIC VALVE  MILD MITRAL REGURGITATION  Patient stopped due to calf pain  Initial mild drop in BP with exercise.  Maximum workload of 11.30 METs was achieved during exercise.  RESTING HYPERTENSION - EXAGGERATED RESPONSE  Laboratory Data:  ChemistryNo results for input(s): NA, K, CL, CO2, GLUCOSE, BUN, CREATININE, CALCIUM, GFRNONAA, GFRAA, ANIONGAP in the last 168 hours.  No results for input(s): PROT, ALBUMIN, AST, ALT, ALKPHOS, BILITOT in the last 168 hours.   Hematology Recent Labs  Lab 10/21/18 0021  WBC 10.2  RBC 6.19*  HGB 18.7*  HCT 54.3*  MCV 87.7  MCH 30.2  MCHC 34.4  RDW 13.9  PLT 256   Cardiac Enzymes hsTn 180 BNPNo results for input(s): BNP, PROBNP in the last 168 hours.  DDimer No results for input(s): DDIMER in the last 168 hours.  Radiology/Studies:  No results found.  Assessment and Plan:   Oscar [name] is a 58 year old man with a strong family history of premature CAD who presents with 2 days of stuttering chest pain found to have inferior STEMI. In the cath lab, was noted to have significant 3V disease, with likely  culprit being LCx lesion (99% distal lesion).  #Inferior STEMI; Culprit Likely dLCx lesion #Severe 3V CAD -- CT surgery consult for possible CABG given significant lesions in all three vessels -- On nitro gtt for pain control.  -- On heparin, aggrastat gtt -- Can restart  -- Hemoglobin a1c ordered for risk stratification -- LDL well controlled on current regimen.   #Hx of GI Bleed -- Will ensure on IV PPI -- Per recent GI notes ok for restart ASA.  -- Active type and screen; ensure 2 large bore IV access.   #HLD -- Continue home statin regimen.    Severity of Illness: The appropriate patient status for this patient is INPATIENT. Inpatient status is judged to be reasonable and necessary in order to provide the required intensity of service to ensure the patient's safety. The patient's presenting symptoms, physical exam findings, and initial radiographic and laboratory data in the context of their chronic comorbidities is felt to place them at high risk for further clinical deterioration. Furthermore, it is not anticipated that the patient will be medically stable for discharge from the hospital within 2 midnights of admission. The following factors support the patient status of inpatient.   " The patient's presenting symptoms include chest pain. " The worrisome physical exam findings include ST  elevation MI " The initial radiographic and laboratory data are worrisome because of EKG changes, troponin.  " The chronic co-morbidities include HTN, HLD, family history of CAD.   * I certify that at the point of admission it is my clinical judgment that the patient will require inpatient hospital care spanning beyond 2 midnights from the point of admission due to high intensity of service, high risk for further deterioration and high frequency of surveillance required.*   For questions or updates, please contact [place] Please consult [email or web address] for contact info under      Signed, Milus Banister, MD  10/21/2018 12:54 AM

## 2018-10-21 NOTE — Progress Notes (Signed)
Patient has home CPAP at bedside.  RT assistance not needed at this time. 

## 2018-10-21 NOTE — Progress Notes (Signed)
Potassium 3.3. Paged cardiology. Waiting for return call.

## 2018-10-21 NOTE — Plan of Care (Signed)
  Problem: Education: Goal: Understanding of CV disease, CV risk reduction, and recovery process will improve Outcome: Progressing   Problem: Cardiovascular: Goal: Ability to achieve and maintain adequate cardiovascular perfusion will improve Outcome: Progressing Goal: Vascular access site(s) Level 0-1 will be maintained Outcome: Progressing   Problem: Clinical Measurements: Goal: Ability to maintain clinical measurements within normal limits will improve Outcome: Progressing Goal: Respiratory complications will improve Outcome: Progressing

## 2018-10-21 NOTE — Progress Notes (Signed)
[id number] Waited for pt to visit with ECHO . Pre op ed done with emphasis on IS and walking after surgery. Gave IS and pt could get to 2500 ml with instruction. Gave OHS booklet, care guide, and in the tube handout. Wrote down how to view pre op video. Pt stated wife will be available after discharge to assist with care. Pt stated he is very active and will be motivated to walk after surgery. Will follow up after surgery. Graylon Good RN BSN 10/21/2018 1:56 PM

## 2018-10-21 NOTE — Progress Notes (Signed)
ANTICOAGULATION CONSULT NOTE - Initial Consult  Pharmacy Consult for Heparin Indication: STEMI  Allergies  Allergen Reactions  . Advair Diskus [Fluticasone-Salmeterol] Cough    Patient Measurements: Height: 6\' 2"  (188 cm) Weight: 250 lb (113.4 kg) IBW/kg (Calculated) : 82.2 Heparin Dosing Weight: 110 kg  Vital Signs: BP: 124/68 (07/22 0128) Pulse Rate: 83 (07/22 0128)  Labs: Recent Labs    10/21/18 0021  HGB 18.7*  HCT 54.3*  PLT 256  APTT 36  LABPROT 13.8  INR 1.1  CREATININE 1.20  TROPONINIHS 180*    Estimated Creatinine Clearance: 89.9 mL/min (by C-G formula based on SCr of 1.2 mg/dL).   Medical History: Past Medical History:  Diagnosis Date  . Adenomatous polyp   . Chickenpox   . 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 Diverticulitis   . Hepatitis A    as a child, no current liver problems  . Hyperlipidemia   . Hypertension    he also sees Dr. Lynita Lombard in [place], [place]   . Lower GI bleed    Hx: of  . Nephrolithiasis    2-3 kidney stones in past  . Obesity   . Pneumonia   . Prediabetes   . Sleep apnea 2003   CPAP machine, pt does not know settings    Medications:  Medications Prior to Admission  Medication Sig Dispense Refill Last Dose  . acidophilus (RISAQUAD) CAPS capsule Take 1 capsule by mouth 2 (two) times daily.     Marland Kitchen amLODipine (NORVASC) 5 MG tablet Take 5 mg by mouth daily.     . B Complex-C (B-COMPLEX WITH VITAMIN C) tablet Take 1 tablet by mouth daily.     Marland Kitchen DHEA 50 MG CAPS Take 50 mg by mouth daily.     . hydrochlorothiazide (HYDRODIURIL) 25 MG tablet Take 25 mg by mouth daily.      . metFORMIN (GLUCOPHAGE) 500 MG tablet Take 500-1,000 mg by mouth 2 (two) times daily. Pt takes one tablet in the morning and two at night.     . rosuvastatin (CRESTOR) 40 MG tablet Take 1 tablet (40 mg total) by mouth daily. 90 tablet 3   . vitamin C (ASCORBIC ACID) 500 MG tablet Take 1,000 mg by mouth 2 (two) times daily.     Marland Kitchen zolpidem (AMBIEN)  5 MG tablet Take 5 mg by mouth at bedtime as needed for sleep.       Assessment: 58 y.o. male with STEMI sp cath, found to have multivessel CAD and awaiting CABG, for heparin.  Aggrastat started post-cath  Goal of Therapy:  Heparin level 0.3-0.5 Monitor platelets by anticoagulation protocol: Yes   Plan:  Start heparin 1700 units/hr at 1000 today Check heparin level in 8 hours.   Dennis Killilea, Bronson Curb 10/21/2018,2:40 AM

## 2018-10-21 NOTE — ED Provider Notes (Signed)
[facility] EMERGENCY DEPARTMENT Provider Note   CSN: [id number] Arrival date & time: 10/21/18  0003    History   Chief Complaint Chief Complaint  Patient presents with  . Code STEMI    HPI Oscar [name] is a 58 y.o. male.   The history is provided by the patient.  He has history of hypertension, hyperlipidemia, prediabetes and is brought in by ambulance because of chest pain.  He has been having intermittent chest pain for the last 2days, episodes only lasting 30 seconds to up to 2 minutes.  He had talked with his cardiologist and was to be seen sometime in the next several weeks.  Tonight at about 10:30 PM, he developed numbness in both arms and a severe pain in his chest which she is unable to characterize.  Pain is rated at 9/10.  He denied dyspnea or nausea but did have diaphoresis.  EMS was called and gave aspirin and nitroglycerin with no relief.  Of note, he is a non-smoker but has a strong family history of premature coronary atherosclerosis.  Past Medical History:  Diagnosis Date  . Adenomatous polyp   . Chickenpox   . 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 Diverticulitis   . Hepatitis A    as a child, no current liver problems  . Hyperlipidemia   . Hypertension    he also sees Dr. Lynita Lombard in [place], [place]   . Lower GI bleed    Hx: of  . Nephrolithiasis    2-3 kidney stones in past  . Obesity   . Pneumonia   . Prediabetes   . Sleep apnea 2003   CPAP machine, pt does not know settings    Patient Active Problem List   Diagnosis Date Noted  . Prolapsed internal hemorrhoids, grade 2 12/05/2016  . Chest pain 11/01/2015  . Ventral hernia 10/25/2014  . Fracture of left distal radius 04/22/2013  . 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 Obesity (BMI 30-39.9) 04/17/2012  . Incisional hernia, recurrent 02/24/2012  . Rectus diastasis 02/24/2012  . BACK PAIN, LUMBAR 11/16/2009  . Hyperlipidemia 05/24/2008  . Essential  hypertension 05/24/2008  . ASTHMA 05/24/2008  . Sleep apnea 05/24/2008  . NEPHROLITHIASIS, HX OF 05/24/2008    Past Surgical History:  Procedure Laterality Date  . APPENDECTOMY   10-76yrs ago  . CLOSED REDUCTION FINGER WITH PERCUTANEOUS PINNING Left 04/22/2013   Procedure: LEFT WRIST CLOSED REDUCTION CONVERTED TO OPEN REDUCTION INTERNAL FIXATION, LEFT CARPAL TUNNEL RELEASE;  Surgeon: Linna Hoff, MD;  Location: [facility];  Service: Orthopedics;  Laterality: Left;  . COLONOSCOPY  07-09-09   benign polyp repeat 5 years, Dr.Sam Penelope Coop  . HERNIA REPAIR     repaired x 4  . INSERTION OF MESH  04/09/2012   Procedure: INSERTION OF MESH;  Surgeon: Earnstine Regal, MD;  Location: [facility];  Service: General;  Laterality: N/A;  . TONSILLECTOMY  as child  . UVULECTOMY  yrs ago   for snoring  . VENTRAL HERNIA REPAIR  04/09/2012   Procedure: LAPAROSCOPIC VENTRAL HERNIA;  Surgeon: Earnstine Regal, MD;  Location: [facility];  Service: General;  Laterality: N/A;  Laparoscopic Ventral Incisional Hernia Repair with Mesh        Home Medications    Prior to Admission medications   Medication Sig Start Date End Date Taking? Authorizing Provider  acidophilus (RISAQUAD) CAPS capsule Take 1 capsule by mouth 2 (two) times daily.    [provider]  amLODipine (NORVASC) 5 MG tablet Take 5 mg by mouth daily.    [provider]  B Complex-C (B-COMPLEX WITH VITAMIN C) tablet Take 1 tablet by mouth daily.    [provider]  DHEA 50 MG CAPS Take 50 mg by mouth daily.    [provider]  hydrochlorothiazide (HYDRODIURIL) 25 MG tablet Take 25 mg by mouth daily.     [provider]  metFORMIN (GLUCOPHAGE) 500 MG tablet Take 500-1,000 mg by mouth 2 (two) times daily. Pt takes one tablet in the morning and two at night.    [provider]  rosuvastatin (CRESTOR) 40 MG tablet Take 1 tablet (40 mg total) by mouth daily. 10/24/15   Lelon Perla, MD  vitamin C (ASCORBIC ACID)  500 MG tablet Take 1,000 mg by mouth 2 (two) times daily.    [provider]  zolpidem (AMBIEN) 5 MG tablet Take 5 mg by mouth at bedtime as needed for sleep.    [provider]    Family History Family History  Problem Relation Age of Onset  . Stroke Mother        late 69s  . CAD Mother        MI in her 54s  . CAD Father 73  . Marfan syndrome Brother   . Diabetes Brother   . Hypertension Brother   . Obesity Brother   . Colon cancer Neg Hx     Social History Social History   Tobacco Use  . Smoking status: Never Smoker  . Smokeless tobacco: Never Used  Substance Use Topics  . Alcohol use: Yes    Alcohol/week: 0.0 standard drinks    Comment: 2-3 glasses 2-3 times a week-wine; occasional liquior drinks  . Drug use: No     Allergies   Advair diskus [fluticasone-salmeterol]   Review of Systems Review of Systems  All other systems reviewed and are negative.    Physical Exam Updated Vital Signs BP (!) 157/90   Pulse 85   Resp (!) 31   Ht 6\' 2"  (1.88 m)   Wt 113.4 kg   SpO2 100%   BMI 32.10 kg/m   Physical Exam Vitals signs and nursing note reviewed.    58 year old male, appears very uncomfortable, but is in no acute respiratory distress. Vital signs are significant for elevated respiratory rate and blood pressure. Oxygen saturation is 100%, which is normal. Head is normocephalic and atraumatic. PERRLA, EOMI. Oropharynx is clear. Neck is nontender and supple without adenopathy or JVD. Back is nontender and there is no CVA tenderness. Lungs are clear without rales, wheezes, or rhonchi. Chest is nontender. Heart has regular rate and rhythm without murmur. Abdomen is soft, flat, nontender without masses or hepatosplenomegaly and peristalsis is normoactive. Extremities have no cyanosis or edema, full range of motion is present. Skin is warm and dry without rash. Neurologic: Mental status is normal, cranial nerves are intact, there are no motor  or sensory deficits.  ED Treatments / Results  Labs (all labs ordered are listed, but only abnormal results are displayed) Labs Reviewed  CBC WITH DIFFERENTIAL/PLATELET - Abnormal; Notable for the following components:      Result Value   RBC 6.19 (*)    Hemoglobin 18.7 (*)    HCT 54.3 (*)    Monocytes Absolute 1.3 (*)    All other components within normal limits  SARS CORONAVIRUS 2 (HOSPITAL ORDER, [facility])  PROTIME-INR  APTT  COMPREHENSIVE METABOLIC PANEL  LIPID PANEL  TROPONIN I (HIGH SENSITIVITY)    EKG EKG Interpretation  Date/Time:  Wednesday October 21 2018 00:07:59 EDT Ventricular Rate:  85 PR Interval:  192 QRS Duration: 108 QT Interval:  374 QTC Calculation: 445 R Axis:   16 Text Interpretation:  ** Poor data quality, interpretation may be adversely affected Sinus rhythm with Premature ventricular complexes or Fusion complexes Incomplete left bundle branch block ST elevation consider inferolateral injury or acute infarct ** ** ** ** * ACUTE MI  ** ** ** ** Abnormal ECG When compared with ECG of 11/01/2015, ** ** ACUTE MI / STEMI ** ** is now present Confirmed by Delora Fuel ([id number]) on 10/21/2018 12:51:16 AM   EKG Interpretation  Date/Time:  Wednesday October 21 2018 00:21:38 EDT Ventricular Rate:  86 PR Interval:  192 QRS Duration: 108 QT Interval:  376 QTC Calculation: 450 R Axis:   4 Text Interpretation:  Sinus rhythm Inferoposterior infarct, acute (RCA) Lateral infarct, acute Anteroseptal infarct, age indeterminate Probable RV involvement, suggest recording right precordial leads Baseline wander in lead(s) V3 >>> Acute MI <<< When compared with ECG of EARLIER SAME DATE No significant change was found Confirmed by Delora Fuel ([id number]) on 10/21/2018 12:54:51 AM       Procedures Procedures  CRITICAL CARE Performed by: Delora Fuel Total critical care time: 45 minutes Critical care time was exclusive of separately billable procedures  and treating other patients. Critical care was necessary to treat or prevent imminent or life-threatening deterioration. Critical care was time spent personally by me on the following activities: development of treatment plan with patient and/or surrogate as well as nursing, discussions with consultants, evaluation of patient's response to treatment, examination of patient, obtaining history from patient or surrogate, ordering and performing treatments and interventions, ordering and review of laboratory studies, ordering and review of radiographic studies, pulse oximetry and re-evaluation of patient's condition.  Medications Ordered in ED Medications  0.9 %  sodium chloride infusion ( Intravenous New Bag/Given 10/21/18 0032)  aspirin chewable tablet 324 mg (324 mg Oral Given by EMS 10/21/18 0026)  heparin injection 4,000 Units (4,000 Units Intravenous Given 10/21/18 0026)  morphine 4 MG/ML injection 4 mg (4 mg Intravenous Given 10/21/18 0023)  morphine 2 MG/ML injection 2 mg (2 mg Intravenous Given 10/21/18 0032)  nitroGLYCERIN 100 mcg/mL intra-arterial injection (has no administration in time range)     Initial Impression / Assessment and Plan / ED Course  I have reviewed the triage vital signs and the nursing notes.  Pertinent labs & imaging results that were available during my care of the patient were reviewed by me and considered in my medical decision making (see chart for details).  Chest pain.  ECG in triage showed inferior wall STEMI.  Curiously, ECG from EMS did not show evidence of STEMI.  Code STEMI was activated and he is given heparin and morphine.  He did have significant relief of discomfort with morphine.  Old records are reviewed, and he had coronary CT angiogram 1 year ago which showed a 60-70% lesion in the LAD, but had normal stress echocardiogram.  I have discussed case with Dr. Tamala Julian of cardiology who will take him directly to catheterization lab.  Patient seen in conjunction  with Dr. Marletta Lor of cardiology service.  Final Clinical Impressions(s) / ED Diagnoses   Final diagnoses:  ST elevation myocardial infarction (STEMI), unspecified artery [facility])    ED Discharge Orders    None  Delora Fuel, MD 18/29/93 [phone]

## 2018-10-21 NOTE — CV Procedure (Signed)
   Acute inferolateral ST elevation MI with spontaneous resolution of ST changes and chest pain  Emergency catheterization via right radial using real-time vascular ultrasound guidance.  Patient is diabetic, hyperlipidemic, and has a history of recurrent diverticular bleeding.  Coronary angiography demonstrated: (1) 80% mid RCA 60% distal RCA 75% PDA..  (2) 80 to 90% tandem proximal LAD stenoses, diffusely diseased mid diagonal #1 with 75% stenosis, and 70% ostial diagonal number.  (3) mid to distal circumflex thrombotic 99% stenosis before a large inferolateral obtuse marginal branch.  (4) widely patent left main  Left ventriculography by hand-injection demonstrated mild inferobasal and mid inferior wall hypokinesis.  EF 55%.  LVEDP 20 mmHg  Recommendations: Diabetic with high bleeding risk on dual antiplatelet therapy will have the best long-term clinical outcome (survival and symptom control) with bypass surgery that should include LIMA to LAD, saphenous vein graft to right coronary, saphenous vein graft to distal circumflex, and possible saphenous vein graft to the first and second diagonal.

## 2018-10-21 NOTE — ED Notes (Signed)
Called [facility] to activate code stemi @ 62:94 per Dr Niki Payment Mins

## 2018-10-21 NOTE — ED Notes (Signed)
Call [employer], [phone] for updates.

## 2018-10-21 NOTE — Anesthesia Preprocedure Evaluation (Addendum)
Anesthesia Evaluation  Patient identified by MRN, date of birth, ID band Patient awake    Reviewed: Allergy & Precautions, NPO status , Patient's Chart, lab work & pertinent test results  History of Anesthesia Complications Negative for: history of anesthetic complications  Airway Mallampati: III  TM Distance: >3 FB Neck ROM: Full    Dental no notable dental hx. (+) Dental Advisory Given   Pulmonary sleep apnea and Continuous Positive Airway Pressure Ventilation ,    Pulmonary exam normal        Cardiovascular hypertension, + CAD and + Past MI  Normal cardiovascular exam   Severe three-vessel CAD including proximal LAD, mid to distal circumflex, and mid RCA.   Acute inferior STEMI presentation with spontaneous resolution of ST elevation  Mild inferior wall hypokinesis.  EF greater than 50%.    IMPRESSIONS    1. The left ventricle has normal systolic function with an ejection fraction of 60-65%. The cavity size was normal. There is moderately increased left ventricular wall thickness. Left ventricular diastolic Doppler parameters are consistent with  pseudonormalization.  2. The right ventricle has normal systolic function. The cavity was normal. There is no increase in right ventricular wall thickness.  3. The aortic valve is tricuspid. Moderate thickening of the aortic valve. Moderate calcification of the aortic valve.  4. The aorta is normal in size and structure.    Neuro/Psych negative neurological ROS  negative psych ROS   GI/Hepatic negative GI ROS, Neg liver ROS,   Endo/Other  diabetes  Renal/GU negative Renal ROS     Musculoskeletal negative musculoskeletal ROS (+)   Abdominal   Peds  Hematology negative hematology ROS (+)   Anesthesia Other Findings Day of surgery medications reviewed with the patient.  Reproductive/Obstetrics                            Anesthesia  Physical Anesthesia Plan  ASA: IV  Anesthesia Plan: General   Post-op Pain Management:    Induction: Intravenous  PONV Risk Score and Plan: 2 and Ondansetron and Dexamethasone  Airway Management Planned: Oral ETT  Additional Equipment: Arterial line, PA Cath, 3D TEE and Ultrasound Guidance Line Placement  Intra-op Plan:   Post-operative Plan: Post-operative intubation/ventilation  Informed Consent: I have reviewed the patients History and Physical, chart, labs and discussed the procedure including the risks, benefits and alternatives for the proposed anesthesia with the patient or authorized representative who has indicated his/her understanding and acceptance.     Dental advisory given  Plan Discussed with: CRNA and Anesthesiologist  Anesthesia Plan Comments:        Anesthesia Quick Evaluation

## 2018-10-22 ENCOUNTER — Inpatient Hospital Stay (HOSPITAL_COMMUNITY)
Admission: EM | Disposition: A | Discharge status: Home / Self Care | Payer: Self-pay | Source: Home / Self Care | Attending: Interventional Cardiology

## 2018-10-22 ENCOUNTER — Inpatient Hospital Stay (HOSPITAL_COMMUNITY): Payer: No Typology Code available for payment source | Admitting: Anesthesiology

## 2018-10-22 ENCOUNTER — Encounter (HOSPITAL_COMMUNITY): Payer: Self-pay | Admitting: Certified Registered Nurse Anesthetist

## 2018-10-22 ENCOUNTER — Inpatient Hospital Stay (HOSPITAL_COMMUNITY): Payer: No Typology Code available for payment source

## 2018-10-22 DIAGNOSIS — I2510 Atherosclerotic heart disease of native coronary artery without angina pectoris: Secondary | ICD-10-CM

## 2018-10-22 HISTORY — PX: CORONARY ARTERY BYPASS GRAFT: SHX141

## 2018-10-22 HISTORY — PX: TEE WITHOUT CARDIOVERSION: SHX5443

## 2018-10-22 HISTORY — PX: RADIAL ARTERY HARVEST: SHX5067

## 2018-10-22 LAB — SURGICAL PCR SCREEN
MRSA, PCR: NEGATIVE
Staphylococcus aureus: NEGATIVE

## 2018-10-22 LAB — POCT I-STAT 7, (LYTES, BLD GAS, ICA,H+H)
Acid-base deficit: 3 mmol/L — ABNORMAL HIGH (ref 0.0–2.0)
Acid-base deficit: 4 mmol/L — ABNORMAL HIGH (ref 0.0–2.0)
Acid-base deficit: 4 mmol/L — ABNORMAL HIGH (ref 0.0–2.0)
Bicarbonate: 25.8 mmol/L (ref 20.0–28.0)
Bicarbonate: 24.5 mmol/L (ref 20.0–28.0)
Bicarbonate: 21 mmol/L (ref 20.0–28.0)
Bicarbonate: 23.3 mmol/L (ref 20.0–28.0)
Calcium, Ion: 1.02 mmol/L — ABNORMAL LOW (ref 1.15–1.40)
Calcium, Ion: 1.1 mmol/L — ABNORMAL LOW (ref 1.15–1.40)
Calcium, Ion: 1.19 mmol/L (ref 1.15–1.40)
Calcium, Ion: 1.23 mmol/L (ref 1.15–1.40)
HCT: 39 % (ref 39.0–52.0)
HCT: 33 % — ABNORMAL LOW (ref 39.0–52.0)
HCT: 39 % (ref 39.0–52.0)
HCT: 40 % (ref 39.0–52.0)
Hemoglobin: 13.3 g/dL (ref 13.0–17.0)
Hemoglobin: 11.2 g/dL — ABNORMAL LOW (ref 13.0–17.0)
Hemoglobin: 13.3 g/dL (ref 13.0–17.0)
Hemoglobin: 13.6 g/dL (ref 13.0–17.0)
O2 Saturation: 100 %
O2 Saturation: 86 %
O2 Saturation: 96 %
O2 Saturation: 98 %
Patient temperature: 37.1
Potassium: 4.9 mmol/L (ref 3.5–5.1)
Potassium: 4.8 mmol/L (ref 3.5–5.1)
Potassium: 4.5 mmol/L (ref 3.5–5.1)
Potassium: 4.8 mmol/L (ref 3.5–5.1)
Sodium: 139 mmol/L (ref 135–145)
Sodium: 138 mmol/L (ref 135–145)
Sodium: 139 mmol/L (ref 135–145)
Sodium: 140 mmol/L (ref 135–145)
TCO2: 27 mmol/L (ref 22–32)
TCO2: 26 mmol/L (ref 22–32)
TCO2: 22 mmol/L (ref 22–32)
TCO2: 25 mmol/L (ref 22–32)
pCO2 arterial: 43.8 mmHg (ref 32.0–48.0)
pCO2 arterial: 56.9 mmHg — ABNORMAL HIGH (ref 32.0–48.0)
pCO2 arterial: 38.1 mmHg (ref 32.0–48.0)
pCO2 arterial: 48.1 mmHg — ABNORMAL HIGH (ref 32.0–48.0)
pH, Arterial: 7.378 (ref 7.350–7.450)
pH, Arterial: 7.242 — ABNORMAL LOW (ref 7.350–7.450)
pH, Arterial: 7.294 — ABNORMAL LOW (ref 7.350–7.450)
pH, Arterial: 7.349 — ABNORMAL LOW (ref 7.350–7.450)
pO2, Arterial: 188 mmHg — ABNORMAL HIGH (ref 83.0–108.0)
pO2, Arterial: 62 mmHg — ABNORMAL LOW (ref 83.0–108.0)
pO2, Arterial: 109 mmHg — ABNORMAL HIGH (ref 83.0–108.0)
pO2, Arterial: 94 mmHg (ref 83.0–108.0)

## 2018-10-22 LAB — CBC
HCT: 48.4 % (ref 39.0–52.0)
HCT: 41.3 % (ref 39.0–52.0)
HCT: 40.7 % (ref 39.0–52.0)
Hemoglobin: 16.4 g/dL (ref 13.0–17.0)
Hemoglobin: 13.6 g/dL (ref 13.0–17.0)
Hemoglobin: 13.7 g/dL (ref 13.0–17.0)
MCH: 30.3 pg (ref 26.0–34.0)
MCH: 30.2 pg (ref 26.0–34.0)
MCH: 30.4 pg (ref 26.0–34.0)
MCHC: 33.9 g/dL (ref 30.0–36.0)
MCHC: 32.9 g/dL (ref 30.0–36.0)
MCHC: 33.7 g/dL (ref 30.0–36.0)
MCV: 89.3 fL (ref 80.0–100.0)
MCV: 91.6 fL (ref 80.0–100.0)
MCV: 90.2 fL (ref 80.0–100.0)
Platelets: 234 10*3/uL (ref 150–400)
Platelets: UNDETERMINED 10*3/uL (ref 150–400)
Platelets: 137 10*3/uL — ABNORMAL LOW (ref 150–400)
RBC: 5.42 MIL/uL (ref 4.22–5.81)
RBC: 4.51 MIL/uL (ref 4.22–5.81)
RBC: 4.51 MIL/uL (ref 4.22–5.81)
RDW: 14.6 % (ref 11.5–15.5)
RDW: 14.6 % (ref 11.5–15.5)
RDW: 14.6 % (ref 11.5–15.5)
WBC: 12.1 10*3/uL — ABNORMAL HIGH (ref 4.0–10.5)
WBC: 21.2 10*3/uL — ABNORMAL HIGH (ref 4.0–10.5)
WBC: 20.8 10*3/uL — ABNORMAL HIGH (ref 4.0–10.5)
nRBC: 0 % (ref 0.0–0.2)
nRBC: 0 % (ref 0.0–0.2)
nRBC: 0 % (ref 0.0–0.2)

## 2018-10-22 LAB — POCT I-STAT 4, (NA,K, GLUC, HGB,HCT)
Glucose, Bld: 117 mg/dL — ABNORMAL HIGH (ref 70–99)
Glucose, Bld: 138 mg/dL — ABNORMAL HIGH (ref 70–99)
Glucose, Bld: 151 mg/dL — ABNORMAL HIGH (ref 70–99)
Glucose, Bld: 188 mg/dL — ABNORMAL HIGH (ref 70–99)
Glucose, Bld: 181 mg/dL — ABNORMAL HIGH (ref 70–99)
Glucose, Bld: 150 mg/dL — ABNORMAL HIGH (ref 70–99)
HCT: 48 % (ref 39.0–52.0)
HCT: 38 % — ABNORMAL LOW (ref 39.0–52.0)
HCT: 46 % (ref 39.0–52.0)
HCT: 37 % — ABNORMAL LOW (ref 39.0–52.0)
HCT: 32 % — ABNORMAL LOW (ref 39.0–52.0)
HCT: 40 % (ref 39.0–52.0)
Hemoglobin: 16.3 g/dL (ref 13.0–17.0)
Hemoglobin: 12.9 g/dL — ABNORMAL LOW (ref 13.0–17.0)
Hemoglobin: 15.6 g/dL (ref 13.0–17.0)
Hemoglobin: 12.6 g/dL — ABNORMAL LOW (ref 13.0–17.0)
Hemoglobin: 10.9 g/dL — ABNORMAL LOW (ref 13.0–17.0)
Hemoglobin: 13.6 g/dL (ref 13.0–17.0)
Potassium: 4.4 mmol/L (ref 3.5–5.1)
Potassium: 4.9 mmol/L (ref 3.5–5.1)
Potassium: 5.2 mmol/L — ABNORMAL HIGH (ref 3.5–5.1)
Potassium: 5.8 mmol/L — ABNORMAL HIGH (ref 3.5–5.1)
Potassium: 4.7 mmol/L (ref 3.5–5.1)
Potassium: 4.6 mmol/L (ref 3.5–5.1)
Sodium: 140 mmol/L (ref 135–145)
Sodium: 138 mmol/L (ref 135–145)
Sodium: 139 mmol/L (ref 135–145)
Sodium: 140 mmol/L (ref 135–145)
Sodium: 137 mmol/L (ref 135–145)
Sodium: 140 mmol/L (ref 135–145)

## 2018-10-22 LAB — GLUCOSE, CAPILLARY
Glucose-Capillary: 128 mg/dL — ABNORMAL HIGH (ref 70–99)
Glucose-Capillary: 130 mg/dL — ABNORMAL HIGH (ref 70–99)
Glucose-Capillary: 136 mg/dL — ABNORMAL HIGH (ref 70–99)
Glucose-Capillary: 160 mg/dL — ABNORMAL HIGH (ref 70–99)
Glucose-Capillary: 140 mg/dL — ABNORMAL HIGH (ref 70–99)
Glucose-Capillary: 136 mg/dL — ABNORMAL HIGH (ref 70–99)
Glucose-Capillary: 141 mg/dL — ABNORMAL HIGH (ref 70–99)

## 2018-10-22 LAB — BASIC METABOLIC PANEL
Anion gap: 7 (ref 5–15)
BUN: 16 mg/dL (ref 6–20)
CO2: 21 mmol/L — ABNORMAL LOW (ref 22–32)
Calcium: 7.4 mg/dL — ABNORMAL LOW (ref 8.9–10.3)
Chloride: 109 mmol/L (ref 98–111)
Creatinine, Ser: 1.24 mg/dL (ref 0.61–1.24)
GFR calc Af Amer: >60 mL/min (ref >60)
GFR calc non Af Amer: >60 mL/min (ref >60)
Glucose, Bld: 136 mg/dL — ABNORMAL HIGH (ref 70–99)
Potassium: 4.9 mmol/L (ref 3.5–5.1)
Sodium: 137 mmol/L (ref 135–145)

## 2018-10-22 LAB — APTT: aPTT: 34 seconds (ref 24–36)

## 2018-10-22 LAB — COMPREHENSIVE METABOLIC PANEL
ALT: 24 U/L (ref 0–44)
AST: 78 U/L — ABNORMAL HIGH (ref 15–41)
Albumin: 3.6 g/dL (ref 3.5–5.0)
Alkaline Phosphatase: 42 U/L (ref 38–126)
Anion gap: 10 (ref 5–15)
BUN: 12 mg/dL (ref 6–20)
CO2: 23 mmol/L (ref 22–32)
Calcium: 8.9 mg/dL (ref 8.9–10.3)
Chloride: 107 mmol/L (ref 98–111)
Creatinine, Ser: 1.05 mg/dL (ref 0.61–1.24)
GFR calc Af Amer: >60 mL/min (ref >60)
GFR calc non Af Amer: >60 mL/min (ref >60)
Glucose, Bld: 110 mg/dL — ABNORMAL HIGH (ref 70–99)
Potassium: 4.2 mmol/L (ref 3.5–5.1)
Sodium: 140 mmol/L (ref 135–145)
Total Bilirubin: 0.8 mg/dL (ref 0.3–1.2)
Total Protein: 6.7 g/dL (ref 6.5–8.1)

## 2018-10-22 LAB — BRAIN NATRIURETIC PEPTIDE: B Natriuretic Peptide: 132 pg/mL — ABNORMAL HIGH (ref 0.0–100.0)

## 2018-10-22 LAB — HEMOGLOBIN AND HEMATOCRIT, BLOOD
HCT: 33.3 % — ABNORMAL LOW (ref 39.0–52.0)
Hemoglobin: 11 g/dL — ABNORMAL LOW (ref 13.0–17.0)

## 2018-10-22 LAB — HEPARIN LEVEL (UNFRACTIONATED): Heparin Unfractionated: 0.43 IU/mL (ref 0.30–0.70)

## 2018-10-22 LAB — ECHO INTRAOPERATIVE TEE
Height: 74 in
Weight: 4000 oz

## 2018-10-22 LAB — MAGNESIUM: Magnesium: 2.6 mg/dL — ABNORMAL HIGH (ref 1.7–2.4)

## 2018-10-22 LAB — PLATELET COUNT: Platelets: 155 10*3/uL (ref 150–400)

## 2018-10-22 LAB — PROTIME-INR
INR: 1.4 — ABNORMAL HIGH (ref 0.8–1.2)
Prothrombin Time: 17.3 seconds — ABNORMAL HIGH (ref 11.4–15.2)

## 2018-10-22 SURGERY — CORONARY ARTERY BYPASS GRAFTING (CABG)
Anesthesia: General | Site: Chest

## 2018-10-22 MED ORDER — MAGNESIUM SULFATE 4 GM/100ML IV SOLN
4.0000 g | Freq: Once | INTRAVENOUS | Status: AC
  Administered 2018-10-22: 16:00:00 4 g via INTRAVENOUS
  Filled 2018-10-22: qty 100

## 2018-10-22 MED ORDER — FAMOTIDINE IN NACL 20-0.9 MG/50ML-% IV SOLN
20.0000 mg | Freq: Two times a day (BID) | INTRAVENOUS | Status: AC
  Administered 2018-10-22: 20 mg via INTRAVENOUS

## 2018-10-22 MED ORDER — SODIUM CHLORIDE 0.9% FLUSH
3.0000 mL | Freq: Two times a day (BID) | INTRAVENOUS | Status: DC
  Administered 2018-10-23 – 2018-10-25 (×3): 3 mL via INTRAVENOUS

## 2018-10-22 MED ORDER — SODIUM CHLORIDE 0.9 % IV SOLN
INTRAVENOUS | Status: DC | PRN
  Administered 2018-10-22: 14:00:00 via INTRAVENOUS

## 2018-10-22 MED ORDER — BUPIVACAINE HCL (PF) 0.5 % IJ SOLN
INTRAMUSCULAR | Status: AC
  Filled 2018-10-22: qty 10

## 2018-10-22 MED ORDER — SODIUM CHLORIDE 0.9 % IV SOLN
1.5000 g | Freq: Two times a day (BID) | INTRAVENOUS | Status: AC
  Administered 2018-10-22 – 2018-10-24 (×4): 1.5 g via INTRAVENOUS
  Filled 2018-10-22 (×5): qty 1.5

## 2018-10-22 MED ORDER — CHLORHEXIDINE GLUCONATE 0.12% ORAL RINSE (MEDLINE KIT)
15.0000 mL | Freq: Two times a day (BID) | OROMUCOSAL | Status: DC
  Administered 2018-10-22 – 2018-10-25 (×6): 15 mL via OROMUCOSAL

## 2018-10-22 MED ORDER — BISACODYL 5 MG PO TBEC
10.0000 mg | DELAYED_RELEASE_TABLET | Freq: Every day | ORAL | Status: DC
  Administered 2018-10-23 – 2018-10-27 (×5): 10 mg via ORAL
  Filled 2018-10-22 (×5): qty 2

## 2018-10-22 MED ORDER — CHLORHEXIDINE GLUCONATE 0.12 % MT SOLN
15.0000 mL | OROMUCOSAL | Status: AC
  Administered 2018-10-22: 16:00:00 15 mL via OROMUCOSAL

## 2018-10-22 MED ORDER — AMIODARONE IV BOLUS ONLY 150 MG/100ML
INTRAVENOUS | Status: DC | PRN
  Administered 2018-10-22: 150 mg via INTRAVENOUS

## 2018-10-22 MED ORDER — LIDOCAINE 2% (20 MG/ML) 5 ML SYRINGE
INTRAMUSCULAR | Status: DC | PRN
  Administered 2018-10-22: 100 mg via INTRAVENOUS

## 2018-10-22 MED ORDER — ACETAMINOPHEN 650 MG RE SUPP
650.0000 mg | Freq: Once | RECTAL | Status: AC
  Administered 2018-10-22: 15:00:00 650 mg via RECTAL

## 2018-10-22 MED ORDER — DOCUSATE SODIUM 100 MG PO CAPS
200.0000 mg | ORAL_CAPSULE | Freq: Every day | ORAL | Status: DC
  Administered 2018-10-23 – 2018-10-27 (×5): 200 mg via ORAL
  Filled 2018-10-22 (×5): qty 2

## 2018-10-22 MED ORDER — OXYCODONE HCL 5 MG PO TABS
5.0000 mg | ORAL_TABLET | ORAL | Status: DC | PRN
  Administered 2018-10-22 – 2018-10-25 (×10): 10 mg via ORAL
  Administered 2018-10-26 (×4): 5 mg via ORAL
  Administered 2018-10-27: 05:00:00 10 mg via ORAL
  Administered 2018-10-27 (×2): 5 mg via ORAL
  Filled 2018-10-22 (×2): qty 2
  Filled 2018-10-22: qty 1
  Filled 2018-10-22 (×5): qty 2
  Filled 2018-10-22 (×2): qty 1
  Filled 2018-10-22: qty 2
  Filled 2018-10-22: qty 1
  Filled 2018-10-22 (×2): qty 2
  Filled 2018-10-22: qty 1
  Filled 2018-10-22 (×2): qty 2
  Filled 2018-10-22: qty 1

## 2018-10-22 MED ORDER — SODIUM CHLORIDE (PF) 0.9 % IJ SOLN
OROMUCOSAL | Status: DC | PRN
  Administered 2018-10-22: 10:00:00 4 mL via TOPICAL

## 2018-10-22 MED ORDER — ORAL CARE MOUTH RINSE
15.0000 mL | OROMUCOSAL | Status: DC
  Administered 2018-10-22: 18:00:00 15 mL via OROMUCOSAL

## 2018-10-22 MED ORDER — SODIUM CHLORIDE (PF) 0.9 % IJ SOLN
OROMUCOSAL | Status: DC | PRN
  Administered 2018-10-22: 4 mL via TOPICAL

## 2018-10-22 MED ORDER — VANCOMYCIN HCL IN DEXTROSE 1-5 GM/200ML-% IV SOLN
1000.0000 mg | Freq: Once | INTRAVENOUS | Status: AC
  Administered 2018-10-22: 22:00:00 1000 mg via INTRAVENOUS
  Filled 2018-10-22: qty 200

## 2018-10-22 MED ORDER — PROPOFOL 10 MG/ML IV BOLUS
INTRAVENOUS | Status: AC
  Filled 2018-10-22: qty 20

## 2018-10-22 MED ORDER — PROPOFOL 10 MG/ML IV BOLUS
INTRAVENOUS | Status: DC | PRN
  Administered 2018-10-22: 200 mg via INTRAVENOUS

## 2018-10-22 MED ORDER — SODIUM CHLORIDE 0.9% FLUSH: 3.0000 mL | INTRAVENOUS | Status: DC | PRN

## 2018-10-22 MED ORDER — LACTATED RINGERS IV SOLN
INTRAVENOUS | Status: DC | PRN
  Administered 2018-10-22: 08:00:00 via INTRAVENOUS

## 2018-10-22 MED ORDER — HEPARIN SODIUM (PORCINE) 1000 UNIT/ML IJ SOLN
INTRAMUSCULAR | Status: DC | PRN
  Administered 2018-10-22: 34000 [IU] via INTRAVENOUS
  Administered 2018-10-22: 5000 [IU] via INTRAVENOUS

## 2018-10-22 MED ORDER — MORPHINE SULFATE (PF) 2 MG/ML IV SOLN
1.0000 mg | INTRAVENOUS | Status: DC | PRN
  Administered 2018-10-22: 20:00:00 4 mg via INTRAVENOUS
  Administered 2018-10-22 (×2): 1 mg via INTRAVENOUS
  Administered 2018-10-25: 4 mg via INTRAVENOUS
  Filled 2018-10-22 (×2): qty 2
  Filled 2018-10-22: qty 1

## 2018-10-22 MED ORDER — ACETAMINOPHEN 160 MG/5ML PO SOLN: 650.0000 mg | Freq: Once | ORAL | Status: AC

## 2018-10-22 MED ORDER — BUPIVACAINE 0.5 % ON-Q PUMP SINGLE CATH 400 ML
INJECTION | Status: AC | PRN
  Administered 2018-10-22: 400 mL

## 2018-10-22 MED ORDER — SUCCINYLCHOLINE CHLORIDE 200 MG/10ML IV SOSY
PREFILLED_SYRINGE | INTRAVENOUS | Status: DC | PRN
  Administered 2018-10-22: 120 mg via INTRAVENOUS

## 2018-10-22 MED ORDER — INSULIN REGULAR(HUMAN) IN NACL 100-0.9 UT/100ML-% IV SOLN
INTRAVENOUS | Status: DC
  Administered 2018-10-23: 4.3 [IU]/h via INTRAVENOUS
  Filled 2018-10-22: qty 100

## 2018-10-22 MED ORDER — ASPIRIN EC 325 MG PO TBEC
325.0000 mg | DELAYED_RELEASE_TABLET | Freq: Every day | ORAL | Status: DC
  Administered 2018-10-23 – 2018-10-27 (×5): 325 mg via ORAL
  Filled 2018-10-22 (×5): qty 1

## 2018-10-22 MED ORDER — ARTIFICIAL TEARS OPHTHALMIC OINT
TOPICAL_OINTMENT | OPHTHALMIC | Status: DC | PRN
  Administered 2018-10-22: 1 via OPHTHALMIC

## 2018-10-22 MED ORDER — PLASMA-LYTE 148 IV SOLN
INTRAVENOUS | Status: DC | PRN
  Administered 2018-10-22: 500 mL

## 2018-10-22 MED ORDER — INSULIN REGULAR BOLUS VIA INFUSION
0.0000 [IU] | Freq: Three times a day (TID) | INTRAVENOUS | Status: DC
  Filled 2018-10-22: qty 10

## 2018-10-22 MED ORDER — VANCOMYCIN HCL 1000 MG IV SOLR
INTRAVENOUS | Status: DC | PRN
  Administered 2018-10-22: 1000 mL

## 2018-10-22 MED ORDER — ASPIRIN 81 MG PO CHEW: 324.0000 mg | CHEWABLE_TABLET | Freq: Every day | ORAL | Status: DC

## 2018-10-22 MED ORDER — MIDAZOLAM HCL 2 MG/2ML IJ SOLN: 2.0000 mg | INTRAMUSCULAR | Status: DC | PRN

## 2018-10-22 MED ORDER — ALBUTEROL SULFATE HFA 108 (90 BASE) MCG/ACT IN AERS
INHALATION_SPRAY | RESPIRATORY_TRACT | Status: AC
  Filled 2018-10-22: qty 6.7

## 2018-10-22 MED ORDER — ACETAMINOPHEN 500 MG PO TABS
1000.0000 mg | ORAL_TABLET | Freq: Four times a day (QID) | ORAL | Status: DC
  Administered 2018-10-23 – 2018-10-27 (×14): 1000 mg via ORAL
  Filled 2018-10-22 (×14): qty 2

## 2018-10-22 MED ORDER — AMIODARONE HCL IN DEXTROSE 360-4.14 MG/200ML-% IV SOLN
30.0000 mg/h | INTRAVENOUS | Status: DC
  Administered 2018-10-23: 30 mg/h via INTRAVENOUS
  Filled 2018-10-22 (×2): qty 200

## 2018-10-22 MED ORDER — DEXMEDETOMIDINE HCL IN NACL 400 MCG/100ML IV SOLN
0.0000 ug/kg/h | INTRAVENOUS | Status: DC
  Administered 2018-10-22 – 2018-10-23 (×2): 0.1 ug/kg/h via INTRAVENOUS

## 2018-10-22 MED ORDER — MIDAZOLAM HCL 2 MG/2ML IJ SOLN
INTRAMUSCULAR | Status: AC
  Filled 2018-10-22: qty 2

## 2018-10-22 MED ORDER — MUPIROCIN 2 % EX OINT
1.0000 "application " | TOPICAL_OINTMENT | Freq: Two times a day (BID) | CUTANEOUS | Status: DC
  Administered 2018-10-22 – 2018-10-23 (×3): 1 via NASAL
  Filled 2018-10-22: qty 22

## 2018-10-22 MED ORDER — NITROGLYCERIN IN D5W 200-5 MCG/ML-% IV SOLN: 0.0000 ug/min | INTRAVENOUS | Status: DC

## 2018-10-22 MED ORDER — 0.9 % SODIUM CHLORIDE (POUR BTL) OPTIME
TOPICAL | Status: DC | PRN
  Administered 2018-10-22: 5000 mL

## 2018-10-22 MED ORDER — POTASSIUM CHLORIDE 10 MEQ/50ML IV SOLN: 10.0000 meq | INTRAVENOUS | Status: AC

## 2018-10-22 MED ORDER — FENTANYL CITRATE (PF) 250 MCG/5ML IJ SOLN
INTRAMUSCULAR | Status: DC | PRN
  Administered 2018-10-22: 250 ug via INTRAVENOUS
  Administered 2018-10-22: 50 ug via INTRAVENOUS
  Administered 2018-10-22: 100 ug via INTRAVENOUS
  Administered 2018-10-22: 50 ug via INTRAVENOUS
  Administered 2018-10-22: 250 ug via INTRAVENOUS
  Administered 2018-10-22: 100 ug via INTRAVENOUS
  Administered 2018-10-22: 150 ug via INTRAVENOUS
  Administered 2018-10-22: 100 ug via INTRAVENOUS
  Administered 2018-10-22 (×2): 250 ug via INTRAVENOUS
  Administered 2018-10-22: 200 ug via INTRAVENOUS

## 2018-10-22 MED ORDER — ONDANSETRON HCL 4 MG/2ML IJ SOLN: 4.0000 mg | Freq: Four times a day (QID) | INTRAMUSCULAR | Status: DC | PRN

## 2018-10-22 MED ORDER — BISACODYL 10 MG RE SUPP: 10.0000 mg | Freq: Every day | RECTAL | Status: DC

## 2018-10-22 MED ORDER — MIDAZOLAM HCL 5 MG/5ML IJ SOLN
INTRAMUSCULAR | Status: DC | PRN
  Administered 2018-10-22: 3 mg via INTRAVENOUS
  Administered 2018-10-22 (×3): 2 mg via INTRAVENOUS
  Administered 2018-10-22: 1 mg via INTRAVENOUS
  Administered 2018-10-22: 4 mg via INTRAVENOUS

## 2018-10-22 MED ORDER — MICROFIBRILLAR COLL HEMOSTAT EX PADS
MEDICATED_PAD | CUTANEOUS | Status: DC | PRN
  Administered 2018-10-22: 1 via TOPICAL

## 2018-10-22 MED ORDER — TRAMADOL HCL 50 MG PO TABS
50.0000 mg | ORAL_TABLET | ORAL | Status: DC | PRN
  Administered 2018-10-23 – 2018-10-26 (×6): 100 mg via ORAL
  Filled 2018-10-22 (×7): qty 2

## 2018-10-22 MED ORDER — PANTOPRAZOLE SODIUM 40 MG PO TBEC
40.0000 mg | DELAYED_RELEASE_TABLET | Freq: Every day | ORAL | Status: DC
  Administered 2018-10-24 – 2018-10-27 (×4): 40 mg via ORAL
  Filled 2018-10-22 (×4): qty 1

## 2018-10-22 MED ORDER — ALBUTEROL SULFATE HFA 108 (90 BASE) MCG/ACT IN AERS
INHALATION_SPRAY | RESPIRATORY_TRACT | Status: DC | PRN
  Administered 2018-10-22 (×2): 5 via RESPIRATORY_TRACT

## 2018-10-22 MED ORDER — PHENYLEPHRINE HCL-NACL 20-0.9 MG/250ML-% IV SOLN: 0.0000 ug/min | INTRAVENOUS | Status: DC

## 2018-10-22 MED ORDER — METHYLPREDNISOLONE SODIUM SUCC 125 MG IJ SOLR
INTRAMUSCULAR | Status: DC | PRN
  Administered 2018-10-22: 125 mg via INTRAVENOUS

## 2018-10-22 MED ORDER — LACTATED RINGERS IV SOLN: 500.0000 mL | Freq: Once | INTRAVENOUS | Status: DC | PRN

## 2018-10-22 MED ORDER — LACTATED RINGERS IV SOLN
INTRAVENOUS | Status: DC
  Administered 2018-10-24: 10:00:00 via INTRAVENOUS

## 2018-10-22 MED ORDER — ONDANSETRON HCL 4 MG/2ML IJ SOLN
INTRAMUSCULAR | Status: AC
  Filled 2018-10-22: qty 2

## 2018-10-22 MED ORDER — LACTATED RINGERS IV SOLN
INTRAVENOUS | Status: DC | PRN
  Administered 2018-10-22: 07:00:00 via INTRAVENOUS

## 2018-10-22 MED ORDER — FENTANYL CITRATE (PF) 250 MCG/5ML IJ SOLN
INTRAMUSCULAR | Status: AC
  Filled 2018-10-22: qty 10

## 2018-10-22 MED ORDER — BUPIVACAINE 0.25 % ON-Q PUMP DUAL CATH 400 ML
400.0000 mL | INJECTION | Status: DC
  Filled 2018-10-22: qty 400

## 2018-10-22 MED ORDER — LACTATED RINGERS IV SOLN
INTRAVENOUS | Status: DC | PRN
  Administered 2018-10-22 (×2): via INTRAVENOUS

## 2018-10-22 MED ORDER — AMIODARONE HCL IN DEXTROSE 360-4.14 MG/200ML-% IV SOLN
60.0000 mg/h | INTRAVENOUS | Status: AC
  Administered 2018-10-22: 60 mg/h via INTRAVENOUS
  Filled 2018-10-22: qty 200

## 2018-10-22 MED ORDER — LACTATED RINGERS IV SOLN: INTRAVENOUS | Status: DC

## 2018-10-22 MED ORDER — MIDAZOLAM HCL (PF) 10 MG/2ML IJ SOLN
INTRAMUSCULAR | Status: AC
  Filled 2018-10-22: qty 2

## 2018-10-22 MED ORDER — AMIODARONE HCL IN DEXTROSE 360-4.14 MG/200ML-% IV SOLN
INTRAVENOUS | Status: DC | PRN
  Administered 2018-10-22: 60 mg/h via INTRAVENOUS

## 2018-10-22 MED ORDER — BUPIVACAINE HCL (PF) 0.5 % IJ SOLN
INTRAMUSCULAR | Status: DC | PRN
  Administered 2018-10-22: 10 mL

## 2018-10-22 MED ORDER — DEXAMETHASONE SODIUM PHOSPHATE 10 MG/ML IJ SOLN
INTRAMUSCULAR | Status: AC
  Filled 2018-10-22: qty 1

## 2018-10-22 MED ORDER — ACETAMINOPHEN 160 MG/5ML PO SOLN
1000.0000 mg | Freq: Four times a day (QID) | ORAL | Status: DC
  Filled 2018-10-22: qty 40.6

## 2018-10-22 MED ORDER — METOPROLOL TARTRATE 25 MG/10 ML ORAL SUSPENSION: 12.5000 mg | Freq: Two times a day (BID) | ORAL | Status: DC

## 2018-10-22 MED ORDER — METOCLOPRAMIDE HCL 5 MG/ML IJ SOLN
10.0000 mg | Freq: Four times a day (QID) | INTRAMUSCULAR | Status: AC
  Administered 2018-10-22 – 2018-10-23 (×4): 10 mg via INTRAVENOUS
  Filled 2018-10-22 (×3): qty 2

## 2018-10-22 MED ORDER — ALBUMIN HUMAN 5 % IV SOLN
INTRAVENOUS | Status: DC | PRN
  Administered 2018-10-22 (×2): via INTRAVENOUS

## 2018-10-22 MED ORDER — METOPROLOL TARTRATE 12.5 MG HALF TABLET
12.5000 mg | ORAL_TABLET | Freq: Two times a day (BID) | ORAL | Status: DC
  Administered 2018-10-22 – 2018-10-23 (×2): 12.5 mg via ORAL
  Filled 2018-10-22 (×3): qty 1

## 2018-10-22 MED ORDER — BUPIVACAINE 0.5 % ON-Q PUMP SINGLE CATH 400 ML
400.0000 mL | INJECTION | Status: DC
  Filled 2018-10-22: qty 400

## 2018-10-22 MED ORDER — CALCIUM CHLORIDE 10 % IV SOLN
INTRAVENOUS | Status: DC | PRN
  Administered 2018-10-22: 300 mg via INTRAVENOUS
  Administered 2018-10-22 (×2): 200 mg via INTRAVENOUS
  Administered 2018-10-22: 300 mg via INTRAVENOUS

## 2018-10-22 MED ORDER — FENTANYL CITRATE (PF) 250 MCG/5ML IJ SOLN
INTRAMUSCULAR | Status: AC
  Filled 2018-10-22: qty 20

## 2018-10-22 MED ORDER — ALBUMIN HUMAN 5 % IV SOLN
250.0000 mL | INTRAVENOUS | Status: AC | PRN
  Administered 2018-10-22 – 2018-10-23 (×3): 12.5 g via INTRAVENOUS
  Filled 2018-10-22: qty 250

## 2018-10-22 MED ORDER — SODIUM CHLORIDE 0.9 % IV SOLN
INTRAVENOUS | Status: DC
  Administered 2018-10-22: 16:00:00 via INTRAVENOUS

## 2018-10-22 MED ORDER — METOPROLOL TARTRATE 5 MG/5ML IV SOLN: 2.5000 mg | INTRAVENOUS | Status: DC | PRN

## 2018-10-22 MED ORDER — PHENYLEPHRINE 40 MCG/ML (10ML) SYRINGE FOR IV PUSH (FOR BLOOD PRESSURE SUPPORT)
PREFILLED_SYRINGE | INTRAVENOUS | Status: DC | PRN
  Administered 2018-10-22: 80 ug via INTRAVENOUS
  Administered 2018-10-22 (×2): 120 ug via INTRAVENOUS
  Administered 2018-10-22: 80 ug via INTRAVENOUS

## 2018-10-22 MED ORDER — HEMOSTATIC AGENTS (NO CHARGE) OPTIME
TOPICAL | Status: DC | PRN
  Administered 2018-10-22: 1 via TOPICAL

## 2018-10-22 MED ORDER — ROCURONIUM BROMIDE 10 MG/ML (PF) SYRINGE
PREFILLED_SYRINGE | INTRAVENOUS | Status: DC | PRN
  Administered 2018-10-22: 50 mg via INTRAVENOUS
  Administered 2018-10-22: 40 mg via INTRAVENOUS
  Administered 2018-10-22: 30 mg via INTRAVENOUS
  Administered 2018-10-22: 60 mg via INTRAVENOUS
  Administered 2018-10-22: 50 mg via INTRAVENOUS
  Administered 2018-10-22: 20 mg via INTRAVENOUS

## 2018-10-22 MED ORDER — ALPRAZOLAM 0.5 MG PO TABS: 1.0000 mg | ORAL_TABLET | Freq: Every evening | ORAL | Status: DC | PRN

## 2018-10-22 MED ORDER — PROTAMINE SULFATE 10 MG/ML IV SOLN
INTRAVENOUS | Status: DC | PRN
  Administered 2018-10-22: 320 mg via INTRAVENOUS
  Administered 2018-10-22: 10 mg via INTRAVENOUS

## 2018-10-22 MED ORDER — FENTANYL CITRATE (PF) 250 MCG/5ML IJ SOLN
INTRAMUSCULAR | Status: AC
  Filled 2018-10-22: qty 5

## 2018-10-22 MED ORDER — SODIUM CHLORIDE 0.9 % IV SOLN: 250.0000 mL | INTRAVENOUS | Status: DC

## 2018-10-22 MED ORDER — SODIUM CHLORIDE 0.45 % IV SOLN
INTRAVENOUS | Status: DC | PRN
  Administered 2018-10-22: 15:00:00 via INTRAVENOUS

## 2018-10-22 MED FILL — Tirofiban HCl in NaCl 0.9% IV Soln 5 MG/100ML (Base Equiv): INTRAVENOUS | Qty: 100 | Status: AC

## 2018-10-22 MED FILL — Nitroglycerin IV Soln 100 MCG/ML in D5W: INTRA_ARTERIAL | Qty: 10 | Status: AC

## 2018-10-22 SURGICAL SUPPLY — 117 items
ADAPTER CARDIO PERF ANTE/RETRO (ADAPTER) ×4 IMPLANT
APPLIER CLIP 9.375 SM OPEN (CLIP) ×4
BAG DECANTER FOR FLEXI CONT (MISCELLANEOUS) ×8 IMPLANT
BANDAGE ELASTIC 4 VELCRO ST LF (GAUZE/BANDAGES/DRESSINGS) ×8 IMPLANT
BASKET HEART (ORDER IN 25'S) (MISCELLANEOUS) ×1
BASKET HEART (ORDER IN 25S) (MISCELLANEOUS) ×3 IMPLANT
BATTERY MAXDRIVER (MISCELLANEOUS) ×4 IMPLANT
BLADE 11 SAFETY STRL DISP (BLADE) ×4 IMPLANT
BLADE 15 SAFETY STRL DISP (BLADE) ×4 IMPLANT
BLADE CLIPPER SURG (BLADE) ×8 IMPLANT
BLADE STERNUM SYSTEM 6 (BLADE) ×4 IMPLANT
BNDG ELASTIC 4X5.8 VLCR STR LF (GAUZE/BANDAGES/DRESSINGS) ×4 IMPLANT
BNDG ELASTIC 6X5.8 VLCR STR LF (GAUZE/BANDAGES/DRESSINGS) ×4 IMPLANT
BNDG GAUZE ELAST 4 BULKY (GAUZE/BANDAGES/DRESSINGS) ×8 IMPLANT
CANISTER SUCT 3000ML PPV (MISCELLANEOUS) ×4 IMPLANT
CANNULA EZ GLIDE 8.0 24FR (CANNULA) ×4 IMPLANT
CATH CPB KIT HENDRICKSON (MISCELLANEOUS) ×4 IMPLANT
CATH KIT ON-Q SILVERSOAK 5IN (CATHETERS) ×8 IMPLANT
CATH ROBINSON RED A/P 18FR (CATHETERS) ×8 IMPLANT
CLIP APPLIE 9.375 SM OPEN (CLIP) ×3 IMPLANT
CLIP RETRACTION 3.0MM CORONARY (MISCELLANEOUS) ×4 IMPLANT
CLIP VESOCCLUDE MED 24/CT (CLIP) ×4 IMPLANT
CLIP VESOCCLUDE SM WIDE 24/CT (CLIP) ×4 IMPLANT
CONN ST 1/4X3/8  BEN (MISCELLANEOUS) ×3
CONN ST 1/4X3/8 BEN (MISCELLANEOUS) ×9 IMPLANT
COVER MAYO STAND STRL (DRAPES) ×4 IMPLANT
COVER WAND RF STERILE (DRAPES) IMPLANT
DECANTER SPIKE VIAL GLASS SM (MISCELLANEOUS) ×4 IMPLANT
DERMABOND ADVANCED (GAUZE/BANDAGES/DRESSINGS) ×2
DERMABOND ADVANCED .7 DNX12 (GAUZE/BANDAGES/DRESSINGS) ×6 IMPLANT
DRAIN CHANNEL 28F RND 3/8 FF (WOUND CARE) ×16 IMPLANT
DRAPE CARDIOVASCULAR INCISE (DRAPES) ×1
DRAPE EXTREMITY T 121X128X90 (DISPOSABLE) ×4 IMPLANT
DRAPE HALF SHEET 40X57 (DRAPES) ×8 IMPLANT
DRAPE SLUSH/WARMER DISC (DRAPES) ×4 IMPLANT
DRAPE SRG 135X102X78XABS (DRAPES) ×3 IMPLANT
DRSG AQUACEL AG ADV 3.5X14 (GAUZE/BANDAGES/DRESSINGS) ×4 IMPLANT
ELECT BLADE 4.0 EZ CLEAN MEGAD (MISCELLANEOUS) ×8
ELECT CAUTERY BLADE 6.4 (BLADE) ×8 IMPLANT
ELECT REM PT RETURN 9FT ADLT (ELECTROSURGICAL) ×8
ELECTRODE BLDE 4.0 EZ CLN MEGD (MISCELLANEOUS) ×6 IMPLANT
ELECTRODE REM PT RTRN 9FT ADLT (ELECTROSURGICAL) ×6 IMPLANT
FELT TEFLON 1X6 (MISCELLANEOUS) ×4 IMPLANT
GAUZE SPONGE 4X4 12PLY STRL (GAUZE/BANDAGES/DRESSINGS) ×12 IMPLANT
GAUZE SPONGE 4X4 12PLY STRL LF (GAUZE/BANDAGES/DRESSINGS) ×12 IMPLANT
GEL ULTRASOUND 20GR AQUASONIC (MISCELLANEOUS) IMPLANT
GLOVE BIOGEL PI IND STRL 6 (GLOVE) ×12 IMPLANT
GLOVE BIOGEL PI IND STRL 6.5 (GLOVE) ×12 IMPLANT
GLOVE BIOGEL PI INDICATOR 6 (GLOVE) ×4
GLOVE BIOGEL PI INDICATOR 6.5 (GLOVE) ×4
GLOVE INDICATOR 7.5 STRL GRN (GLOVE) ×16 IMPLANT
GLOVE NEODERM STRL 7.5 LF PF (GLOVE) ×9 IMPLANT
GLOVE SURG NEODERM 7.5  LF PF (GLOVE) ×3
GOWN STRL REUS W/ TWL LRG LVL3 (GOWN DISPOSABLE) ×24 IMPLANT
GOWN STRL REUS W/TWL LRG LVL3 (GOWN DISPOSABLE) ×8
HEMOSTAT POWDER SURGIFOAM 1G (HEMOSTASIS) ×12 IMPLANT
HEMOSTAT SURGICEL 2X14 (HEMOSTASIS) ×4 IMPLANT
INSERT FOGARTY XLG (MISCELLANEOUS) ×4 IMPLANT
KIT BASIN OR (CUSTOM PROCEDURE TRAY) ×4 IMPLANT
KIT SUCTION CATH 14FR (SUCTIONS) ×8 IMPLANT
KIT TURNOVER KIT B (KITS) ×4 IMPLANT
KIT VASOVIEW HEMOPRO 2 VH 4000 (KITS) ×4 IMPLANT
LEAD PACING MYOCARDI (MISCELLANEOUS) ×4 IMPLANT
MARKER GRAFT CORONARY BYPASS (MISCELLANEOUS) ×12 IMPLANT
NS IRRIG 1000ML POUR BTL (IV SOLUTION) ×24 IMPLANT
PACK E OPEN HEART (SUTURE) ×4 IMPLANT
PACK OPEN HEART (CUSTOM PROCEDURE TRAY) ×4 IMPLANT
PAD ARMBOARD 7.5X6 YLW CONV (MISCELLANEOUS) ×8 IMPLANT
PAD ELECT DEFIB RADIOL ZOLL (MISCELLANEOUS) ×4 IMPLANT
PENCIL BUTTON HOLSTER BLD 10FT (ELECTRODE) ×4 IMPLANT
PLATE STERNAL 2.3X208 14H 2-PK (Plate) ×4 IMPLANT
POSITIONER HEAD DONUT 9IN (MISCELLANEOUS) ×4 IMPLANT
PUNCH AORTIC ROTATE 4.0MM (MISCELLANEOUS) ×4 IMPLANT
SCREW LOCKING TI 2.3X13MM (Screw) ×32 IMPLANT
SCREW STERNAL 2.3X17MM (Screw) ×24 IMPLANT
SET CARDIOPLEGIA MPS 5001102 (MISCELLANEOUS) ×4 IMPLANT
SHEARS HARMONIC 9CM CVD (BLADE) ×8 IMPLANT
SHEARS HARMONIC STRL 23CM (MISCELLANEOUS) ×4 IMPLANT
SOLUTION ANTI FOG 6CC (MISCELLANEOUS) ×4 IMPLANT
SPONGE LAP 18X18 RF (DISPOSABLE) ×12 IMPLANT
SUT BONE WAX W31G (SUTURE) ×4 IMPLANT
SUT MNCRL AB 3-0 PS2 18 (SUTURE) ×8 IMPLANT
SUT MNCRL AB 4-0 PS2 18 (SUTURE) ×8 IMPLANT
SUT PDS AB 1 CTX 36 (SUTURE) ×8 IMPLANT
SUT PROLENE 3 0 SH DA (SUTURE) ×4 IMPLANT
SUT PROLENE 5 0 C 1 36 (SUTURE) IMPLANT
SUT PROLENE 6 0 C 1 30 (SUTURE) ×12 IMPLANT
SUT PROLENE 7 0 BV1 MDA (SUTURE) ×8 IMPLANT
SUT PROLENE 8 0 BV175 6 (SUTURE) ×8 IMPLANT
SUT PROLENE BLUE 7 0 (SUTURE) ×4 IMPLANT
SUT SILK  1 MH (SUTURE) ×1
SUT SILK 1 MH (SUTURE) ×3 IMPLANT
SUT SILK 2 0 SH CR/8 (SUTURE) ×4 IMPLANT
SUT SILK 3 0 SH CR/8 (SUTURE) IMPLANT
SUT STEEL 6MS V (SUTURE) ×4 IMPLANT
SUT STEEL SZ 6 DBL 3X14 BALL (SUTURE) ×8 IMPLANT
SUT VIC AB 2-0 CT1 27 (SUTURE) ×2
SUT VIC AB 2-0 CT1 TAPERPNT 27 (SUTURE) ×6 IMPLANT
SUT VIC AB 2-0 CTX 27 (SUTURE) IMPLANT
SUT VIC AB 3-0 SH 27 (SUTURE)
SUT VIC AB 3-0 SH 27X BRD (SUTURE) IMPLANT
SUT VIC AB 3-0 X1 27 (SUTURE) IMPLANT
SYR 50ML SLIP (SYRINGE) IMPLANT
SYSTEM SAHARA CHEST DRAIN ATS (WOUND CARE) ×8 IMPLANT
TABLE PACK (MISCELLANEOUS) IMPLANT
TAPE CLOTH SURG 4X10 WHT LF (GAUZE/BANDAGES/DRESSINGS) ×4 IMPLANT
TOWEL GREEN STERILE (TOWEL DISPOSABLE) ×4 IMPLANT
TOWEL GREEN STERILE FF (TOWEL DISPOSABLE) ×4 IMPLANT
TRAY FOL W/BAG SLVR 16FR STRL (SET/KITS/TRAYS/PACK) ×3 IMPLANT
TRAY FOLEY SLVR 16FR TEMP STAT (SET/KITS/TRAYS/PACK) IMPLANT
TRAY FOLEY W/BAG SLVR 16FR LF (SET/KITS/TRAYS/PACK) ×1
TUBING ART PRESS 72  MALE/FEM (TUBING) ×1
TUBING ART PRESS 72 MALE/FEM (TUBING) ×3 IMPLANT
TUBING LAP HI FLOW INSUFFLATIO (TUBING) ×4 IMPLANT
TUNNELER SHEATH ON-Q 11GX8 DSP (PAIN MANAGEMENT) ×4 IMPLANT
UNDERPAD 30X30 (UNDERPADS AND DIAPERS) ×8 IMPLANT
WATER STERILE IRR 1000ML POUR (IV SOLUTION) ×8 IMPLANT

## 2018-10-22 NOTE — Procedures (Signed)
Extubation Procedure Note  Patient Details:   Name: Marquavion Venhuizen DOB: 18-Jan-1961 MRN: [id number]   Airway Documentation:    Vent end date: 10/22/18 Vent end time: 1826   Evaluation  O2 sats: stable throughout Complications: No apparent complications Patient did tolerate procedure well. Bilateral Breath Sounds: Clear   Yes   Pt extubated without difficulty to 4 l/m [place].    Earney Navy 10/22/2018, 6:28 PM

## 2018-10-22 NOTE — Anesthesia Procedure Notes (Signed)
Central Venous Catheter Insertion Performed by: Duane Boston, MD, anesthesiologist Start/End7/23/2020 6:46 AM, 10/22/2018 6:56 AM Patient location: Pre-op. Preanesthetic checklist: patient identified, IV checked, site marked, risks and benefits discussed, surgical consent, monitors and equipment checked, pre-op evaluation, timeout performed and anesthesia consent Position: Trendelenburg Lidocaine 1% used for infiltration and patient sedated Hand hygiene performed , maximum sterile barriers used  and Seldinger technique used Catheter size: 9 Fr Total catheter length 8. PA cath was placed.Sheath introducer Swan type:thermodilution PA Cath depth:50 Procedure performed using ultrasound guided technique. Ultrasound Notes:anatomy identified, needle tip was noted to be adjacent to the nerve/plexus identified, no ultrasound evidence of intravascular and/or intraneural injection and image(s) printed for medical record Attempts: 1 Following insertion, line sutured, dressing applied and Biopatch. Post procedure assessment: free fluid flow, blood return through all ports and no air  Patient tolerated the procedure well with no immediate complications.

## 2018-10-22 NOTE — Brief Op Note (Signed)
10/21/2018 - 10/22/2018  11:56 AM  PATIENT:  Oscar [name]  58 y.o. male  PRE-OPERATIVE DIAGNOSIS:  Coronary Artery Disease  POST-OPERATIVE DIAGNOSIS:  Coronary Artery Disease  PROCEDURE:  Procedure(s):  CORONARY ARTERY BYPASS GRAFTING x 4 -LIMA to LAD -RIMA to PDA - Radial Artery to OM -RSVG to Diagonal  OPEN HARVEST LEFT RADIAL ARTERY  ENDOSCOPIC HARVEST GREATER SAPHENOUS VEIN -Right Thigh  TRANSESOPHAGEAL ECHOCARDIOGRAM (TEE) (N/A)  SURGEON:  Surgeon(s) and Role:    * Wonda Olds, MD - Primary  PHYSICIAN ASSISTANT: Ellwood Handler PA-C, Nicholes Rough PA-C  ANESTHESIA:   general  EBL:  2000 mL   BLOOD ADMINISTERED: CELLSAVER  DRAINS: Left and Right Pleural Chest Tubes, Mediastinal Chest Drains   LOCAL MEDICATIONS USED:  NONE  SPECIMEN:  No Specimen  DISPOSITION OF SPECIMEN:  N/A  COUNTS:  YES  TOURNIQUET:  * No tourniquets in log *  DICTATION: .Dragon Dictation  PLAN OF CARE: Admit to inpatient   PATIENT DISPOSITION:  ICU - intubated and hemodynamically stable.   Delay start of Pharmacological VTE agent (>24hrs) due to surgical blood loss or risk of bleeding: yes

## 2018-10-22 NOTE — Anesthesia Postprocedure Evaluation (Signed)
Anesthesia Post Note  Patient: Dwon Sky  Procedure(s) Performed: CORONARY ARTERY BYPASS GRAFTING (CABG) times four using right and left internal mammary arteries, left radial artery, and right greater saphenous vein harvested endoscopically. (N/A Chest) LEFT RADIAL ARTERY HARVEST (Left Arm Lower) TRANSESOPHAGEAL ECHOCARDIOGRAM (TEE) (N/A )     Patient location during evaluation: PACU Anesthesia Type: General Level of consciousness: sedated Pain management: pain level controlled Vital Signs Assessment: post-procedure vital signs reviewed and stable Respiratory status: spontaneous breathing and respiratory function stable Cardiovascular status: stable Postop Assessment: no apparent nausea or vomiting Anesthetic complications: no    Last Vitals:  Vitals:   10/22/18 1600 10/22/18 1615  BP: 121/66   Pulse: 84 84  Resp: 15 16  Temp: 36.6 C 36.9 C  SpO2: 97% 95%    Last Pain:  Vitals:   10/22/18 1600  TempSrc: Core  PainSc: 0-No pain                 Yehonatan Grandison DANIEL

## 2018-10-22 NOTE — OR Nursing (Signed)
1st call made to ICU @ 1320. 2nd call made to ICU @ 1423.

## 2018-10-22 NOTE — Progress Notes (Signed)
History and Physical Interval Note:  10/22/2018 7:42 AM  Thedore Mins  has presented today for surgery, with the diagnosis of CAD.  The various methods of treatment have been discussed with the patient and family. After consideration of risks, benefits and other options for treatment, the patient has consented to  Procedure(s): CORONARY ARTERY BYPASS GRAFTING (CABG) (N/A) Possible,  LEFT RADIAL ARTERY HARVEST (Left) TRANSESOPHAGEAL ECHOCARDIOGRAM (TEE) (N/A) as a surgical intervention.  The patient's history has been reviewed, patient examined, no change in status, stable for surgery.  I have reviewed the patient's chart and labs.  Questions were answered to the patient's satisfaction.  He wishes to proceed.   Wonda Olds [phone]

## 2018-10-22 NOTE — Progress Notes (Signed)
  Echocardiogram Echocardiogram Transesophageal has been performed.  Jannett Celestine 10/22/2018, 9:42 AM

## 2018-10-22 NOTE — Progress Notes (Signed)
RT placed patient on home CPAP machine.  Patient resting comfortably. RT will continue to monitor.

## 2018-10-22 NOTE — Anesthesia Procedure Notes (Signed)
Arterial Line Insertion Start/End7/23/2020 6:45 AM, 10/22/2018 6:50 AM Performed by: Verdie Drown, CRNA, CRNA  Patient location: Pre-op. Preanesthetic checklist: patient identified, IV checked, site marked, risks and benefits discussed, surgical consent, monitors and equipment checked, pre-op evaluation, timeout performed and anesthesia consent Lidocaine 1% used for infiltration and patient sedated radial was placed Catheter size: 20 G Hand hygiene performed  and maximum sterile barriers used   Attempts: 1 Procedure performed without using ultrasound guided technique. Following insertion, dressing applied and Biopatch. Post procedure assessment: normal  Patient tolerated the procedure well with no immediate complications.

## 2018-10-22 NOTE — Op Note (Signed)
CARDIOTHORACIC SURGERY OPERATIVE NOTE  Date of Procedure: 10/22/2018  Preoperative Diagnosis: Severe 3-vessel Coronary Artery Disease, h/o recent MI  Postoperative Diagnosis: Same  Procedure:    Coronary Artery Bypass Grafting x 4   Left Internal Mammary Artery to Distal Left Anterior Descending Coronary Artery; Reverse Saphenous Vein Graft to Diagonal Branch Coronary Artery; pedicled right internal mammary artery to Posterior Descending Coronary Artery; left radial artery to distal Obtuse Marginal Branch of Left Circumflex Coronary Artery; Endoscopic Vein Harvest from right Thigh; left radial artery harvesting  Surgeon: Orvan Seen B. Enis Slipper, MD  Assistant: Ellwood Handler PA-C  Anesthesia: Nicholes Rough PA-C  Operative Findings:  good left ventricular systolic function  good quality left internal mammary artery conduit and RIMA conduit  good quality saphenous vein conduit and radial artery  good quality target vessels for grafting    BRIEF CLINICAL NOTE AND INDICATIONS FOR SURGERY  58 yo gentleman presented with STEMI 2 nights ago. LHC showed severe 3V CAD. Consulted for CABG. Hemodynamically stable pre-operatively with preserved LV function.   DETAILS OF THE OPERATIVE PROCEDURE  Preparation:  The patient is brought to the operating room on the above mentioned date and central monitoring was established by the anesthesia team including placement of Swan-Ganz catheter and right radial arterial line. The patient is placed in the supine position on the operating table.  Intravenous antibiotics are administered. General endotracheal anesthesia is induced uneventfully. A latex-free Foley catheter is placed.  Baseline transesophageal echocardiogram was performed.  Findings were notable for ventricular hypertrophy and no valve disease  The patient's chest, abdomen, both groins, left upper extremity and both lower extremities are prepared and draped in a sterile manner. A time out  procedure is performed.   Surgical Approach and Conduit Harvest:  A median sternotomy incision was performed and the left internal mammary artery is dissected from the chest wall and prepared for bypass grafting. The left internal mammary artery is notably good quality conduit. Simultaneously, the greater saphenous vein is obtained from the patient's right thigh using endoscopic vein harvest technique. The saphenous vein is notably good quality conduit. Additionally, the left radial artery was harvested after carefully documenting intact palmar arch perfused by the ulnar artery by plethysmography. The right internal mammary artery was also mobilized from the right chest wall. Both IMAs were treated with papaverine.  After removal of the saphenous vein, the small surgical incisions in the lower extremity are closed with absorbable suture. Following systemic heparinization, both internal mammary arteries were transected distally noted to have excellent flow.   Extracorporeal Cardiopulmonary Bypass and Myocardial Protection:  The pericardium is opened. The ascending aorta is normal in appearance. The ascending aorta and the right atrium are cannulated for cardiopulmonary bypass.  Adequate heparinization is verified.  The entire pre-bypass portion of the operation was notable for stable hemodynamics.  Cardiopulmonary bypass was begun and the surface of the heart is inspected. Distal target vessels are selected for coronary artery bypass grafting. A cardioplegia cannula is placed in the ascending aorta.  A temperature probe was placed in the interventricular septum.  The patient is allowed to cool passively to34 C systemic temperature.  The aortic cross clamp is applied and cold blood cardioplegia is delivered initially in an antegrade fashion through the aortic root.    Iced saline slush is applied for topical hypothermia.  The initial cardioplegic arrest is rapid with early diastolic arrest.  Repeat  doses of cardioplegia are administered intermittently throughout the entire cross clamp portion of the operation  through the aortic root,  and through subsequently placed  grafts in order to maintain completely flat electrocardiogram.  Myocardial protection was felt to be  excellent.   Coronary Artery Bypass Grafting:   The  diagonal branch of the left anterior descending coronary artery was grafted using a reversed saphenous vein graft in an end-to-side fashion.  At the site of distal anastomosis the target vessel was quality and measured approximately 1.5 mm in diameter.  The distal obtuse marginal branch of the left circumflex coronary artery was grafted using a the left radial artery graft in an end-to-side fashion.  At the site of distal anastomosis the target vessel was good quality and measured approximately 1.5 mm in diameter.  The distal left anterior coronary artery was grafted with the left internal mammary artery in an end-to-side fashion.  At the site of distal anastomosis the target vessel was good quality and measured approximately 1.5 mm in diameter.  The  posterior descending branch of the right coronary artery was grafted using the pedicled right IMA in an end-to-side fashion.  At the site of distal anastomosis the target vessel was good quality and measured approximately 1.5 mm in diameter.  The proximal vein graft and radial artery graft anastomoses were placed directly to the ascending aorta prior to removal of the aortic cross clamp. The aortic cross clamp was removed after a total cross clamp time of 90 minutes.   Procedure Completion:  All proximal and distal coronary anastomoses were inspected for hemostasis and appropriate graft orientation. Epicardial pacing wires are fixed to the right ventricular outflow tract and to the right atrial appendage. The patient is rewarmed to 37C temperature. The patient is weaned and disconnected from cardiopulmonary bypass.  The  patient's rhythm at separation from bypass was sinus bradycardia.  The patient was weaned from cardiopulmonary bypass  without any inotropic support. Total cardiopulmonary bypass time for the operation was 116 minutes.  Followup transesophageal echocardiogram performed after separation from bypass revealed  no changes from the preoperative exam.  The aortic and venous cannula were removed uneventfully. Protamine was administered to reverse the anticoagulation. The mediastinum and pleural space were inspected for hemostasis and irrigated with saline solution. The mediastinum and bilateral pleural space were drained using 28 Fr chest tubes placed through separate stab incisions inferiorly.  The soft tissues anterior to the aorta were reapproximated loosely. The sternum is closed with double strength sternal wire. The soft tissues anterior to the sternum were closed in multiple layers; bilateral ON-Q pain catheters were inserted in the presternal tissues.  The skin is closed with a running subcuticular skin closure.  The post-bypass portion of the operation was notable for stable rhythm and hemodynamics. No blood products were administered during the operation.   Disposition:  The patient tolerated the procedure well and is transported to the surgical intensive care in stable condition. There are no intraoperative complications. All sponge instrument and needle counts are verified correct at completion of the operation.   Jayme Cloud, MD 10/22/2018 3:22 PM

## 2018-10-22 NOTE — Transfer of Care (Signed)
Immediate Anesthesia Transfer of Care Note  Patient: Oscar [name]  Procedure(s) Performed: CORONARY ARTERY BYPASS GRAFTING (CABG) times four using right and left internal mammary arteries, left radial artery, and right greater saphenous vein harvested endoscopically. (N/A Chest) LEFT RADIAL ARTERY HARVEST (Left Arm Lower) TRANSESOPHAGEAL ECHOCARDIOGRAM (TEE) (N/A )  Patient Location: SICU  Anesthesia Type:General  Level of Consciousness: sedated and Patient remains intubated per anesthesia plan  Airway & Oxygen Therapy: Patient remains intubated per anesthesia plan and Patient placed on Ventilator (see vital sign flow sheet for setting)  Post-op Assessment: Report given to RN and Post -op Vital signs reviewed and stable  Post vital signs: Reviewed and stable  Last Vitals:  Vitals Value Taken Time  BP    Temp    Pulse    Resp    SpO2      Last Pain:  Vitals:   10/22/18 0445  TempSrc: Axillary  PainSc:          Complications: No apparent anesthesia complications

## 2018-10-22 NOTE — Progress Notes (Signed)
Patient's wife Baxter Flattery called and updated after patient extubated.

## 2018-10-22 NOTE — Progress Notes (Signed)
CT Surgery PM Rounds  CABG today with BIMA. L radial artery Extubated, dangled on bedside Chest tube drainage ok Post extubation ABG pending

## 2018-10-22 NOTE — Brief Op Note (Signed)
10/22/2018  3:17 PM  PATIENT:  Oscar [name]  58 y.o. male  PRE-OPERATIVE DIAGNOSIS:  Coronary Artery Disease  POST-OPERATIVE DIAGNOSIS:  Coronary Artery Disease  PROCEDURE:  Procedure(s): CORONARY ARTERY BYPASS GRAFTING (CABG) times four using right and left internal mammary arteries, left radial artery, and right greater saphenous vein harvested endoscopically. (N/A) LEFT RADIAL ARTERY HARVEST (Left) TRANSESOPHAGEAL ECHOCARDIOGRAM (TEE) (N/A)  SURGEON:  Surgeon(s) and Role:    * Wonda Olds, MD - Primary  PHYSICIAN ASSISTANT:   ASSISTANTS: Barrett, E PA-C   ANESTHESIA:   general  EBL:  2000 mL   BLOOD ADMINISTERED:1200 CC CELLSAVER  DRAINS: 4 Chest Tube(s) in the mediastinum and bilateral pleural spaces   LOCAL MEDICATIONS USED:  NONE  SPECIMEN:  No Specimen  DISPOSITION OF SPECIMEN:  N/A  COUNTS:  YES  TOURNIQUET:  * No tourniquets in log *  DICTATION: .Note written in EPIC  PLAN OF CARE: Admit to inpatient   PATIENT DISPOSITION:  ICU - intubated and hemodynamically stable.   Delay start of Pharmacological VTE agent (>24hrs) due to surgical blood loss or risk of bleeding: yes

## 2018-10-23 ENCOUNTER — Inpatient Hospital Stay (HOSPITAL_COMMUNITY): Payer: No Typology Code available for payment source

## 2018-10-23 ENCOUNTER — Encounter (HOSPITAL_COMMUNITY): Payer: Self-pay | Admitting: Cardiothoracic Surgery

## 2018-10-23 LAB — GLUCOSE, CAPILLARY
Glucose-Capillary: 105 mg/dL — ABNORMAL HIGH (ref 70–99)
Glucose-Capillary: 109 mg/dL — ABNORMAL HIGH (ref 70–99)
Glucose-Capillary: 109 mg/dL — ABNORMAL HIGH (ref 70–99)
Glucose-Capillary: 116 mg/dL — ABNORMAL HIGH (ref 70–99)
Glucose-Capillary: 121 mg/dL — ABNORMAL HIGH (ref 70–99)
Glucose-Capillary: 122 mg/dL — ABNORMAL HIGH (ref 70–99)
Glucose-Capillary: 126 mg/dL — ABNORMAL HIGH (ref 70–99)
Glucose-Capillary: 130 mg/dL — ABNORMAL HIGH (ref 70–99)
Glucose-Capillary: 134 mg/dL — ABNORMAL HIGH (ref 70–99)
Glucose-Capillary: 135 mg/dL — ABNORMAL HIGH (ref 70–99)
Glucose-Capillary: 157 mg/dL — ABNORMAL HIGH (ref 70–99)
Glucose-Capillary: 157 mg/dL — ABNORMAL HIGH (ref 70–99)
Glucose-Capillary: 75 mg/dL (ref 70–99)
Glucose-Capillary: 96 mg/dL (ref 70–99)

## 2018-10-23 LAB — CBC
HCT: 37.6 % — ABNORMAL LOW (ref 39.0–52.0)
HCT: 35.3 % — ABNORMAL LOW (ref 39.0–52.0)
Hemoglobin: 12.2 g/dL — ABNORMAL LOW (ref 13.0–17.0)
Hemoglobin: 11.7 g/dL — ABNORMAL LOW (ref 13.0–17.0)
MCH: 29.9 pg (ref 26.0–34.0)
MCH: 30.6 pg (ref 26.0–34.0)
MCHC: 32.4 g/dL (ref 30.0–36.0)
MCHC: 33.1 g/dL (ref 30.0–36.0)
MCV: 92.2 fL (ref 80.0–100.0)
MCV: 92.4 fL (ref 80.0–100.0)
Platelets: 129 10*3/uL — ABNORMAL LOW (ref 150–400)
Platelets: 124 10*3/uL — ABNORMAL LOW (ref 150–400)
RBC: 4.08 MIL/uL — ABNORMAL LOW (ref 4.22–5.81)
RBC: 3.82 MIL/uL — ABNORMAL LOW (ref 4.22–5.81)
RDW: 14.7 % (ref 11.5–15.5)
RDW: 14.9 % (ref 11.5–15.5)
WBC: 21.4 10*3/uL — ABNORMAL HIGH (ref 4.0–10.5)
WBC: 18.3 10*3/uL — ABNORMAL HIGH (ref 4.0–10.5)
nRBC: 0 % (ref 0.0–0.2)
nRBC: 0 % (ref 0.0–0.2)

## 2018-10-23 LAB — BASIC METABOLIC PANEL
Anion gap: 9 (ref 5–15)
Anion gap: 8 (ref 5–15)
BUN: 17 mg/dL (ref 6–20)
BUN: 24 mg/dL — ABNORMAL HIGH (ref 6–20)
CO2: 21 mmol/L — ABNORMAL LOW (ref 22–32)
CO2: 23 mmol/L (ref 22–32)
Calcium: 7.8 mg/dL — ABNORMAL LOW (ref 8.9–10.3)
Calcium: 8.1 mg/dL — ABNORMAL LOW (ref 8.9–10.3)
Chloride: 107 mmol/L (ref 98–111)
Chloride: 106 mmol/L (ref 98–111)
Creatinine, Ser: 1.38 mg/dL — ABNORMAL HIGH (ref 0.61–1.24)
Creatinine, Ser: 1.66 mg/dL — ABNORMAL HIGH (ref 0.61–1.24)
GFR calc Af Amer: >60 mL/min (ref >60)
GFR calc Af Amer: 52 mL/min — ABNORMAL LOW (ref >60)
GFR calc non Af Amer: 56 mL/min — ABNORMAL LOW (ref >60)
GFR calc non Af Amer: 45 mL/min — ABNORMAL LOW (ref >60)
Glucose, Bld: 121 mg/dL — ABNORMAL HIGH (ref 70–99)
Glucose, Bld: 158 mg/dL — ABNORMAL HIGH (ref 70–99)
Potassium: 4.7 mmol/L (ref 3.5–5.1)
Potassium: 4.7 mmol/L (ref 3.5–5.1)
Sodium: 137 mmol/L (ref 135–145)
Sodium: 137 mmol/L (ref 135–145)

## 2018-10-23 LAB — MAGNESIUM
Magnesium: 2.6 mg/dL — ABNORMAL HIGH (ref 1.7–2.4)
Magnesium: 2.2 mg/dL (ref 1.7–2.4)

## 2018-10-23 MED ORDER — INSULIN ASPART 100 UNIT/ML ~~LOC~~ SOLN
0.0000 [IU] | SUBCUTANEOUS | Status: DC
  Administered 2018-10-23 – 2018-10-25 (×9): 2 [IU] via SUBCUTANEOUS
  Administered 2018-10-25: 4 [IU] via SUBCUTANEOUS
  Administered 2018-10-25: 2 [IU] via SUBCUTANEOUS

## 2018-10-23 MED ORDER — DIAZEPAM 5 MG/ML IJ SOLN
5.0000 mg | Freq: Once | INTRAMUSCULAR | Status: AC
  Administered 2018-10-23: 07:00:00 5 mg via INTRAVENOUS

## 2018-10-23 MED ORDER — KETOROLAC TROMETHAMINE 15 MG/ML IJ SOLN
15.0000 mg | Freq: Four times a day (QID) | INTRAMUSCULAR | Status: DC
  Administered 2018-10-23 – 2018-10-25 (×9): 15 mg via INTRAVENOUS
  Filled 2018-10-23 (×9): qty 1

## 2018-10-23 MED ORDER — DIAZEPAM 5 MG/ML IJ SOLN
INTRAMUSCULAR | Status: AC
  Filled 2018-10-23: qty 2

## 2018-10-23 MED ORDER — ISOSORBIDE MONONITRATE ER 30 MG PO TB24
15.0000 mg | ORAL_TABLET | Freq: Every day | ORAL | Status: DC
  Administered 2018-10-23 – 2018-10-24 (×2): 15 mg via ORAL
  Filled 2018-10-23 (×2): qty 1

## 2018-10-23 MED ORDER — CHLORHEXIDINE GLUCONATE CLOTH 2 % EX PADS
6.0000 | MEDICATED_PAD | Freq: Every day | CUTANEOUS | Status: DC
  Administered 2018-10-23 – 2018-10-25 (×3): 6 via TOPICAL

## 2018-10-23 MED ORDER — INSULIN ASPART 100 UNIT/ML ~~LOC~~ SOLN
0.0000 [IU] | SUBCUTANEOUS | Status: DC
  Administered 2018-10-23 (×3): 2 [IU] via SUBCUTANEOUS

## 2018-10-23 MED ORDER — ENOXAPARIN SODIUM 40 MG/0.4ML ~~LOC~~ SOLN
40.0000 mg | Freq: Every day | SUBCUTANEOUS | Status: DC
  Administered 2018-10-23 – 2018-10-26 (×4): 40 mg via SUBCUTANEOUS
  Filled 2018-10-23 (×4): qty 0.4

## 2018-10-23 MED FILL — Potassium Chloride Inj 2 mEq/ML: INTRAVENOUS | Qty: 40 | Status: AC

## 2018-10-23 MED FILL — Heparin Sodium (Porcine) Inj 1000 Unit/ML: INTRAMUSCULAR | Qty: 30 | Status: AC

## 2018-10-23 MED FILL — Magnesium Sulfate Inj 50%: INTRAMUSCULAR | Qty: 10 | Status: AC

## 2018-10-23 NOTE — Progress Notes (Signed)
EKG CRITICAL VALUE     12 lead EKG performed.  Critical value noted. Garald Braver, RN notified.   Asencion Gowda, CCT 10/23/2018 7:37 AM

## 2018-10-23 NOTE — Progress Notes (Signed)
Per Dr. Orvan Seen, infuse current LR 1L bag as a bolus over a few hours for patient's low UOP.  RN will continue to monitor.

## 2018-10-23 NOTE — Progress Notes (Signed)
T[facility] DAILY ICU PROGRESS NOTE                   [address]            [place] [address]          [phone]   1 Day Post-Op Procedure(s) (LRB): CORONARY ARTERY BYPASS GRAFTING (CABG) times four using right and left internal mammary arteries, left radial artery, and right greater saphenous vein harvested endoscopically. (N/A) LEFT RADIAL ARTERY HARVEST (Left) TRANSESOPHAGEAL ECHOCARDIOGRAM (TEE) (N/A)  Total Length of Stay:  LOS: 2 days   Subjective:  Doing okay, having some back and lower abdominal spasms.  He denies N/V  Objective: Vital signs in last 24 hours: Temp:  [97.3 F (36.3 C)-100 F (37.8 C)] 98.2 F (36.8 C) (07/24 0830) Pulse Rate:  [69-156] 78 (07/24 1115) Cardiac Rhythm: Atrial paced (07/24 0800) Resp:  [0-25] 15 (07/24 1115) BP: (83-124)/(56-82) 101/80 (07/24 1100) SpO2:  [88 %-100 %] 94 % (07/24 1115) Arterial Line BP: (81-161)/(43-84) 84/43 (07/24 1115) FiO2 (%):  [40 %-50 %] 40 % (07/23 1755) Weight:  [517 kg] 123 kg (07/24 0656)  Filed Weights   10/21/18 0019 10/23/18 0656  Weight: 113.4 kg 123 kg    Weight change:    Hemodynamic parameters for last 24 hours: PAP: (28-59)/(16-36) 39/21 CO:  [4.3 L/min-5.9 L/min] 5.9 L/min CI:  [1.8 L/min/m2-2.5 L/min/m2] 2.5 L/min/m2  Intake/Output from previous day: 07/23 0701 - 07/24 0700 In: 7593.6 [I.V.:4870.6; Blood:1250; IV Piggyback:1473] Out: 0017 [Urine:1710; Blood:2000; Chest Tube:760]  Intake/Output this shift: Total I/O In: 267.9 [I.V.:167.8; IV Piggyback:100.1] Out: 95 [Urine:75; Chest Tube:20]  Current Meds: Scheduled Meds: . acetaminophen  1,000 mg Oral Q6H   Or  . acetaminophen (TYLENOL) oral liquid 160 mg/5 mL  1,000 mg Per Tube Q6H  . aspirin EC  325 mg Oral Daily   Or  . aspirin  324 mg Per Tube Daily  . bisacodyl  10 mg Oral Daily   Or  . bisacodyl  10 mg Rectal Daily  . chlorhexidine gluconate (MEDLINE KIT)  15 mL Mouth Rinse BID  . Chlorhexidine Gluconate  Cloth  6 each Topical Daily  . diazepam      . docusate sodium  200 mg Oral Daily  . insulin aspart  0-24 Units Subcutaneous Q4H  . isosorbide mononitrate  15 mg Oral Daily  . ketorolac  15 mg Intravenous Q6H  . metoCLOPramide (REGLAN) injection  10 mg Intravenous Q6H  . metoprolol tartrate  12.5 mg Oral BID   Or  . metoprolol tartrate  12.5 mg Per Tube BID  . mupirocin ointment  1 application Nasal BID  . [START ON 10/24/2018] pantoprazole  40 mg Oral Daily  . rosuvastatin  40 mg Oral Daily  . sodium chloride flush  3 mL Intravenous Q12H   Continuous Infusions: . sodium chloride Stopped (10/23/18 0837)  . sodium chloride    . sodium chloride 10 mL/hr at 10/22/18 1555  . albumin human 12.5 g (10/23/18 0131)  . amiodarone 30 mg/hr (10/23/18 1100)  . bupivacaine 0.25 % ON-Q pump DUAL CATH 400 mL    . cefUROXime (ZINACEF)  IV Stopped (10/23/18 4944)  . dexmedetomidine (PRECEDEX) IV infusion 0.2 mcg/kg/hr (10/23/18 1100)  . lactated ringers    . lactated ringers 20 mL/hr at 10/22/18 1900  . lactated ringers    . nitroGLYCERIN 10 mcg/min (10/23/18 1100)  . phenylephrine (NEO-SYNEPHRINE) Adult infusion Stopped (10/23/18 0426)   PRN  Meds:.sodium chloride, albumin human, ALPRAZolam, lactated ringers, metoprolol tartrate, midazolam, morphine injection, ondansetron (ZOFRAN) IV, oxyCODONE, sodium chloride flush, traMADol  General appearance: alert, cooperative and no distress Heart: regular rate and rhythm Lungs: clear to auscultation bilaterally Abdomen: soft, non-tender; bowel sounds normal; no masses,  no organomegaly Extremities: edema trace Wound: clean and dry  Lab Results: CBC: Recent Labs    10/22/18 2112 10/23/18 0408  WBC 20.8* 21.4*  HGB 13.7 12.2*  HCT 40.7 37.6*  PLT 137* 129*   BMET:  Recent Labs    10/22/18 2112 10/23/18 0408  NA 137 137  K 4.9 4.7  CL 109 107  CO2 21* 21*  GLUCOSE 136* 121*  BUN 16 17  CREATININE 1.24 1.38*  CALCIUM 7.4* 7.8*     CMET: Lab Results  Component Value Date   WBC 21.4 (H) 10/23/2018   HGB 12.2 (L) 10/23/2018   HCT 37.6 (L) 10/23/2018   PLT 129 (L) 10/23/2018   GLUCOSE 121 (H) 10/23/2018   CHOL 98 10/21/2018   TRIG 160 (H) 10/21/2018   HDL 34 (L) 10/21/2018   LDLCALC 32 10/21/2018   ALT 24 10/22/2018   AST 78 (H) 10/22/2018   NA 137 10/23/2018   K 4.7 10/23/2018   CL 107 10/23/2018   CREATININE 1.38 (H) 10/23/2018   BUN 17 10/23/2018   CO2 21 (L) 10/23/2018   INR 1.4 (H) 10/22/2018   HGBA1C 6.0 (H) 10/21/2018      PT/INR:  Recent Labs    10/22/18 1520  LABPROT 17.3*  INR 1.4*   Radiology: Dg Chest Port 1 View  Result Date: 10/23/2018 CLINICAL DATA:  Status post CABG EXAM: PORTABLE CHEST 1 VIEW COMPARISON:  10/22/2018 FINDINGS: Interval removal of endotracheal and enteric tubes. Right IJ approach Swan-Ganz catheter is similar in configuration to prior exam with coiling of the distal tip. Left chest tube and mediastinal drain remain in place. Heart size is is enlarged. Increasing left basilar opacity with probable small left pleural effusion. No discernible pneumothorax. IMPRESSION: 1. Similar configuration of pulmonary arterial catheter with suggestion of coiling of the distal tip. Consider repositioning. 2. Increasing left basilar opacity which may reflect a combination of atelectasis and pleural fluid. Electronically Signed   By: Davina Poke M.D.   On: 10/23/2018 08:29   Dg Chest Port 1 View  Result Date: 10/22/2018 CLINICAL DATA:  Postop CABG. EXAM: PORTABLE CHEST 1 VIEW COMPARISON:  November 01, 2015 FINDINGS: An ETT is in good position. NG tube terminates below today's film. A right chest tube is noted. A PA catheter is identified. The distal PA catheter appears to form a loop in the pulmonary outflow tract. It is unclear whether the distal tip is located. No pneumothorax. Probable tiny right effusion. No focal infiltrate or overt edema. IMPRESSION: 1. The distal PA catheter appears to  coil in the region of the PA catheter. It is unclear where the distal tip is located. Consider repositioning. 2. Other support apparatus as above. 3. Probable tiny right pleural effusion. Electronically Signed   By: Dorise Bullion III M.D   On: 10/22/2018 16:03     Assessment/Plan: S/P Procedure(s) (LRB): CORONARY ARTERY BYPASS GRAFTING (CABG) times four using right and left internal mammary arteries, left radial artery, and right greater saphenous vein harvested endoscopically. (N/A) LEFT RADIAL ARTERY HARVEST (Left) TRANSESOPHAGEAL ECHOCARDIOGRAM (TEE) (N/A)  1. CV- NSR, weaning NTG as tolerated, will start low dose BB and Imdur for radial graft 2. Pulm- no acute issues, continue  IS, CXR without pneumothorax, some atelectasis present, CT output 760 since surgery 3. Renal- creatinine at 1.38, mild edema, weight is elevated- will hold off on Lasix for now 4. Expected post operative blood loss anemia, Hgb stable at 12.2 5. DM- cbgs controlled, poor oral intake, continue SSIP for now will hold off on long acting insulin 6. Dispo- patient stable, weaning NTG as BP allows, start Imdur for radial artery graft, watch blood sugars as oral intake improves, POD #1 progression orders     Ellwood Handler 10/23/2018 11:56 AM

## 2018-10-23 NOTE — Plan of Care (Signed)
  Problem: Cardiac: Goal: Will achieve and/or maintain hemodynamic stability Outcome: Progressing   Problem: Clinical Measurements: Goal: Postoperative complications will be avoided or minimized Outcome: Progressing   Problem: Respiratory: Goal: Respiratory status will improve Outcome: Completed/Met

## 2018-10-23 NOTE — Plan of Care (Signed)
  Problem: Cardiovascular: Goal: Ability to achieve and maintain adequate cardiovascular perfusion will improve Outcome: Adequate for Discharge Goal: Vascular access site(s) Level 0-1 will be maintained Outcome: Adequate for Discharge

## 2018-10-24 ENCOUNTER — Inpatient Hospital Stay (HOSPITAL_COMMUNITY): Payer: No Typology Code available for payment source

## 2018-10-24 LAB — BASIC METABOLIC PANEL
Anion gap: 8 (ref 5–15)
BUN: 28 mg/dL — ABNORMAL HIGH (ref 6–20)
CO2: 25 mmol/L (ref 22–32)
Calcium: 7.8 mg/dL — ABNORMAL LOW (ref 8.9–10.3)
Chloride: 103 mmol/L (ref 98–111)
Creatinine, Ser: 1.62 mg/dL — ABNORMAL HIGH (ref 0.61–1.24)
GFR calc Af Amer: 53 mL/min — ABNORMAL LOW (ref >60)
GFR calc non Af Amer: 46 mL/min — ABNORMAL LOW (ref >60)
Glucose, Bld: 130 mg/dL — ABNORMAL HIGH (ref 70–99)
Potassium: 4.4 mmol/L (ref 3.5–5.1)
Sodium: 136 mmol/L (ref 135–145)

## 2018-10-24 LAB — CBC
HCT: 35.7 % — ABNORMAL LOW (ref 39.0–52.0)
Hemoglobin: 11.5 g/dL — ABNORMAL LOW (ref 13.0–17.0)
MCH: 30.3 pg (ref 26.0–34.0)
MCHC: 32.2 g/dL (ref 30.0–36.0)
MCV: 94.2 fL (ref 80.0–100.0)
Platelets: 142 10*3/uL — ABNORMAL LOW (ref 150–400)
RBC: 3.79 MIL/uL — ABNORMAL LOW (ref 4.22–5.81)
RDW: 15 % (ref 11.5–15.5)
WBC: 16.5 10*3/uL — ABNORMAL HIGH (ref 4.0–10.5)
nRBC: 0 % (ref 0.0–0.2)

## 2018-10-24 LAB — POCT I-STAT 7, (LYTES, BLD GAS, ICA,H+H)
Acid-base deficit: 5 mmol/L — ABNORMAL HIGH (ref 0.0–2.0)
Acid-base deficit: 5 mmol/L — ABNORMAL HIGH (ref 0.0–2.0)
Bicarbonate: 20 mmol/L (ref 20.0–28.0)
Bicarbonate: 20.3 mmol/L (ref 20.0–28.0)
Calcium, Ion: 1.11 mmol/L — ABNORMAL LOW (ref 1.15–1.40)
Calcium, Ion: 1.16 mmol/L (ref 1.15–1.40)
HCT: 39 % (ref 39.0–52.0)
HCT: 41 % (ref 39.0–52.0)
Hemoglobin: 13.3 g/dL (ref 13.0–17.0)
Hemoglobin: 13.9 g/dL (ref 13.0–17.0)
O2 Saturation: 98 %
O2 Saturation: 94 %
Patient temperature: 37.6
Patient temperature: 37.8
Potassium: 4.6 mmol/L (ref 3.5–5.1)
Potassium: 4.7 mmol/L (ref 3.5–5.1)
Sodium: 141 mmol/L (ref 135–145)
Sodium: 140 mmol/L (ref 135–145)
TCO2: 21 mmol/L — ABNORMAL LOW (ref 22–32)
TCO2: 21 mmol/L — ABNORMAL LOW (ref 22–32)
pCO2 arterial: 38.8 mmHg (ref 32.0–48.0)
pCO2 arterial: 39.8 mmHg (ref 32.0–48.0)
pH, Arterial: 7.322 — ABNORMAL LOW (ref 7.350–7.450)
pH, Arterial: 7.32 — ABNORMAL LOW (ref 7.350–7.450)
pO2, Arterial: 107 mmHg (ref 83.0–108.0)
pO2, Arterial: 81 mmHg — ABNORMAL LOW (ref 83.0–108.0)

## 2018-10-24 LAB — GLUCOSE, CAPILLARY
Glucose-Capillary: 111 mg/dL — ABNORMAL HIGH (ref 70–99)
Glucose-Capillary: 116 mg/dL — ABNORMAL HIGH (ref 70–99)
Glucose-Capillary: 121 mg/dL — ABNORMAL HIGH (ref 70–99)
Glucose-Capillary: 122 mg/dL — ABNORMAL HIGH (ref 70–99)
Glucose-Capillary: 126 mg/dL — ABNORMAL HIGH (ref 70–99)
Glucose-Capillary: 137 mg/dL — ABNORMAL HIGH (ref 70–99)
Glucose-Capillary: 154 mg/dL — ABNORMAL HIGH (ref 70–99)

## 2018-10-24 MED ORDER — METOPROLOL TARTRATE 25 MG/10 ML ORAL SUSPENSION
12.5000 mg | Freq: Two times a day (BID) | ORAL | Status: DC
  Filled 2018-10-24 (×5): qty 5

## 2018-10-24 MED ORDER — COLCHICINE 0.6 MG PO TABS
0.6000 mg | ORAL_TABLET | Freq: Every day | ORAL | Status: DC
  Administered 2018-10-24 – 2018-10-27 (×4): 0.6 mg via ORAL
  Filled 2018-10-24 (×4): qty 1

## 2018-10-24 MED ORDER — METOPROLOL TARTRATE 25 MG PO TABS
25.0000 mg | ORAL_TABLET | Freq: Two times a day (BID) | ORAL | Status: DC
  Administered 2018-10-24 – 2018-10-27 (×7): 25 mg via ORAL
  Filled 2018-10-24 (×7): qty 1

## 2018-10-24 MED ORDER — FUROSEMIDE 10 MG/ML IJ SOLN
40.0000 mg | Freq: Two times a day (BID) | INTRAMUSCULAR | Status: DC
  Administered 2018-10-24 – 2018-10-25 (×4): 40 mg via INTRAVENOUS
  Filled 2018-10-24 (×3): qty 4

## 2018-10-24 MED ORDER — KETOROLAC TROMETHAMINE 15 MG/ML IJ SOLN: 15.0000 mg | Freq: Four times a day (QID) | INTRAMUSCULAR | Status: DC

## 2018-10-24 MED ORDER — ISOSORBIDE DINITRATE 10 MG PO TABS
10.0000 mg | ORAL_TABLET | Freq: Three times a day (TID) | ORAL | Status: DC
  Administered 2018-10-24 – 2018-10-27 (×8): 10 mg via ORAL
  Filled 2018-10-24 (×10): qty 1

## 2018-10-24 MED ORDER — FUROSEMIDE 10 MG/ML IJ SOLN
INTRAMUSCULAR | Status: AC
  Filled 2018-10-24: qty 4

## 2018-10-24 MED ORDER — AMIODARONE HCL 200 MG PO TABS
400.0000 mg | ORAL_TABLET | Freq: Two times a day (BID) | ORAL | Status: DC
  Administered 2018-10-24 – 2018-10-27 (×7): 400 mg via ORAL
  Filled 2018-10-24 (×7): qty 2

## 2018-10-24 NOTE — Progress Notes (Signed)
2 Days Post-Op Procedure(s) (LRB): CORONARY ARTERY BYPASS GRAFTING (CABG) times four using right and left internal mammary arteries, left radial artery, and right greater saphenous vein harvested endoscopically. (N/A) LEFT RADIAL ARTERY HARVEST (Left) TRANSESOPHAGEAL ECHOCARDIOGRAM (TEE) (N/A) Subjective: Feeling better; walked,   Objective: Vital signs in last 24 hours: Temp:  [97.9 F (36.6 C)-99 F (37.2 C)] 98.1 F (36.7 C) (07/25 0400) Pulse Rate:  [75-92] 88 (07/25 0700) Cardiac Rhythm: Normal sinus rhythm (07/24 2000) Resp:  [7-27] 23 (07/25 0700) BP: (83-132)/(56-103) 132/83 (07/25 0700) SpO2:  [88 %-100 %] 97 % (07/25 0700) Arterial Line BP: (84-150)/(43-82) 106/49 (07/24 1400) Weight:  [123.9 kg] 123.9 kg (07/25 0500)  Hemodynamic parameters for last 24 hours: PAP: (37-41)/(21-24) 39/21  Intake/Output from previous day: 07/24 0701 - 07/25 0700 In: 2143.8 [I.V.:1567.5; IV Piggyback:101.3] Out: 870 [Urine:350; Chest Tube:520] Intake/Output this shift: No intake/output data recorded.  General appearance: alert and cooperative Neurologic: intact Heart: regular rate and rhythm, S1, S2 normal, no murmur, click, rub or gallop Lungs: clear to auscultation bilaterally Extremities: mild edema Wound: left upper extremity c/d/i  Lab Results: Recent Labs    10/23/18 1415 10/24/18 0436  WBC 18.3* 16.5*  HGB 11.7* 11.5*  HCT 35.3* 35.7*  PLT 124* 142*   BMET:  Recent Labs    10/23/18 1415 10/24/18 0436  NA 137 136  K 4.7 4.4  CL 106 103  CO2 23 25  GLUCOSE 158* 130*  BUN 24* 28*  CREATININE 1.66* 1.62*  CALCIUM 8.1* 7.8*    PT/INR:  Recent Labs    10/22/18 1520  LABPROT 17.3*  INR 1.4*   ABG    Component Value Date/Time   PHART 7.320 (L) 10/22/2018 1922   HCO3 20.3 10/22/2018 1922   TCO2 21 (L) 10/22/2018 1922   ACIDBASEDEF 5.0 (H) 10/22/2018 1922   O2SAT 94.0 10/22/2018 1922   CBG (last 3)  Recent Labs    10/23/18 1942 10/24/18 0000  10/24/18 0403  GLUCAP 134* 126* 122*    Assessment/Plan: S/P Procedure(s) (LRB): CORONARY ARTERY BYPASS GRAFTING (CABG) times four using right and left internal mammary arteries, left radial artery, and right greater saphenous vein harvested endoscopically. (N/A) LEFT RADIAL ARTERY HARVEST (Left) TRANSESOPHAGEAL ECHOCARDIOGRAM (TEE) (N/A) Mobilize Diuresis Diabetes control Continue foley due to diuresing patient   LOS: 3 days    Wonda Olds 10/24/2018

## 2018-10-24 NOTE — Progress Notes (Signed)
TCTS PM rounds Stable day Ambulated Diuresing.

## 2018-10-24 NOTE — Progress Notes (Signed)
Notified Dr. Orvan Seen of patients rhythm change from sinus to afib with intermittent episodes of SVT's.  RN to continue to monitor for any further changes.

## 2018-10-25 ENCOUNTER — Inpatient Hospital Stay (HOSPITAL_COMMUNITY): Payer: No Typology Code available for payment source

## 2018-10-25 LAB — GLUCOSE, CAPILLARY
Glucose-Capillary: 111 mg/dL — ABNORMAL HIGH (ref 70–99)
Glucose-Capillary: 115 mg/dL — ABNORMAL HIGH (ref 70–99)
Glucose-Capillary: 125 mg/dL — ABNORMAL HIGH (ref 70–99)
Glucose-Capillary: 125 mg/dL — ABNORMAL HIGH (ref 70–99)
Glucose-Capillary: 130 mg/dL — ABNORMAL HIGH (ref 70–99)
Glucose-Capillary: 169 mg/dL — ABNORMAL HIGH (ref 70–99)

## 2018-10-25 LAB — BASIC METABOLIC PANEL
Anion gap: 8 (ref 5–15)
BUN: 36 mg/dL — ABNORMAL HIGH (ref 6–20)
CO2: 26 mmol/L (ref 22–32)
Calcium: 7.7 mg/dL — ABNORMAL LOW (ref 8.9–10.3)
Chloride: 103 mmol/L (ref 98–111)
Creatinine, Ser: 1.68 mg/dL — ABNORMAL HIGH (ref 0.61–1.24)
GFR calc Af Amer: 51 mL/min — ABNORMAL LOW (ref >60)
GFR calc non Af Amer: 44 mL/min — ABNORMAL LOW (ref >60)
Glucose, Bld: 155 mg/dL — ABNORMAL HIGH (ref 70–99)
Potassium: 3.9 mmol/L (ref 3.5–5.1)
Sodium: 137 mmol/L (ref 135–145)

## 2018-10-25 LAB — MAGNESIUM: Magnesium: 2.6 mg/dL — ABNORMAL HIGH (ref 1.7–2.4)

## 2018-10-25 MED ORDER — LISINOPRIL 2.5 MG PO TABS
2.5000 mg | ORAL_TABLET | Freq: Every day | ORAL | Status: DC
  Administered 2018-10-25 – 2018-10-26 (×2): 2.5 mg via ORAL
  Filled 2018-10-25 (×2): qty 1

## 2018-10-25 MED ORDER — RISAQUAD PO CAPS
1.0000 | ORAL_CAPSULE | Freq: Two times a day (BID) | ORAL | Status: DC
  Administered 2018-10-25 – 2018-10-27 (×5): 1 via ORAL
  Filled 2018-10-25 (×6): qty 1

## 2018-10-25 MED ORDER — MOVING RIGHT ALONG BOOK
Freq: Once | Status: AC
  Administered 2018-10-25: 12:00:00
  Filled 2018-10-25: qty 1

## 2018-10-25 MED ORDER — SODIUM CHLORIDE 0.9% FLUSH: 3.0000 mL | INTRAVENOUS | Status: DC | PRN

## 2018-10-25 MED ORDER — SODIUM CHLORIDE 0.9% FLUSH
3.0000 mL | Freq: Two times a day (BID) | INTRAVENOUS | Status: DC
  Administered 2018-10-25 – 2018-10-26 (×3): 3 mL via INTRAVENOUS

## 2018-10-25 MED ORDER — SODIUM CHLORIDE 0.9 % IV SOLN: 250.0000 mL | INTRAVENOUS | Status: DC | PRN

## 2018-10-25 NOTE — Progress Notes (Signed)
RT set up pt home cpap unit and added water to water chamber per pt request. Pt stated he could place self on unit when ready for bed.

## 2018-10-25 NOTE — Progress Notes (Signed)
3 Days Post-Op Procedure(s) (LRB): CORONARY ARTERY BYPASS GRAFTING (CABG) times four using right and left internal mammary arteries, left radial artery, and right greater saphenous vein harvested endoscopically. (N/A) LEFT RADIAL ARTERY HARVEST (Left) TRANSESOPHAGEAL ECHOCARDIOGRAM (TEE) (N/A) Subjective: Rough night at one point with pain and afib--now resolved  Objective: Vital signs in last 24 hours: Temp:  [98.3 F (36.8 C)-99.1 F (37.3 C)] 98.8 F (37.1 C) (07/26 0300) Pulse Rate:  [75-139] 77 (07/26 0700) Cardiac Rhythm: Normal sinus rhythm (07/26 0000) Resp:  [12-30] 12 (07/26 0700) BP: (66-131)/(26-86) 112/64 (07/26 0700) SpO2:  [91 %-98 %] 92 % (07/26 0700) Weight:  [122 kg] 122 kg (07/26 0500)  Hemodynamic parameters for last 24 hours:  PA catheter out  Intake/Output from previous day: 07/25 0701 - 07/26 0700 In: 1430.1 [P.O.:1200; I.V.:130; IV Piggyback:100.1] Out: 2650 [Urine:2450; Chest Tube:200] Intake/Output this shift: No intake/output data recorded.  General appearance: alert, cooperative and no distress Neurologic: intact Heart: regular rate and rhythm, S1, S2 normal, no murmur, click, rub or gallop Wound: dressing dry  Lab Results: Recent Labs    10/23/18 1415 10/24/18 0436  WBC 18.3* 16.5*  HGB 11.7* 11.5*  HCT 35.3* 35.7*  PLT 124* 142*   BMET:  Recent Labs    10/23/18 1415 10/24/18 0436  NA 137 136  K 4.7 4.4  CL 106 103  CO2 23 25  GLUCOSE 158* 130*  BUN 24* 28*  CREATININE 1.66* 1.62*  CALCIUM 8.1* 7.8*    PT/INR:  Recent Labs    10/22/18 1520  LABPROT 17.3*  INR 1.4*   ABG    Component Value Date/Time   PHART 7.320 (L) 10/22/2018 1922   HCO3 20.3 10/22/2018 1922   TCO2 21 (L) 10/22/2018 1922   ACIDBASEDEF 5.0 (H) 10/22/2018 1922   O2SAT 94.0 10/22/2018 1922   CBG (last 3)  Recent Labs    10/24/18 2029 10/24/18 2351 10/25/18 0347  GLUCAP 116* 121* 125*    Assessment/Plan: S/P Procedure(s) (LRB): CORONARY  ARTERY BYPASS GRAFTING (CABG) times four using right and left internal mammary arteries, left radial artery, and right greater saphenous vein harvested endoscopically. (N/A) LEFT RADIAL ARTERY HARVEST (Left) TRANSESOPHAGEAL ECHOCARDIOGRAM (TEE) (N/A) Mobilize Diuresis d/c pacing wires d/c tubes/lines Plan for transfer to step-down: see transfer orders   LOS: 4 days    Wonda Olds 10/25/2018

## 2018-10-26 ENCOUNTER — Inpatient Hospital Stay (HOSPITAL_COMMUNITY): Payer: No Typology Code available for payment source

## 2018-10-26 DIAGNOSIS — Z955 Presence of coronary angioplasty implant and graft: Secondary | ICD-10-CM

## 2018-10-26 LAB — BASIC METABOLIC PANEL
Anion gap: 11 (ref 5–15)
BUN: 42 mg/dL — ABNORMAL HIGH (ref 6–20)
CO2: 27 mmol/L (ref 22–32)
Calcium: 7.6 mg/dL — ABNORMAL LOW (ref 8.9–10.3)
Chloride: 99 mmol/L (ref 98–111)
Creatinine, Ser: 1.81 mg/dL — ABNORMAL HIGH (ref 0.61–1.24)
GFR calc Af Amer: 47 mL/min — ABNORMAL LOW (ref >60)
GFR calc non Af Amer: 40 mL/min — ABNORMAL LOW (ref >60)
Glucose, Bld: 118 mg/dL — ABNORMAL HIGH (ref 70–99)
Potassium: 3.7 mmol/L (ref 3.5–5.1)
Sodium: 137 mmol/L (ref 135–145)

## 2018-10-26 LAB — CBC
HCT: 29.3 % — ABNORMAL LOW (ref 39.0–52.0)
Hemoglobin: 9.6 g/dL — ABNORMAL LOW (ref 13.0–17.0)
MCH: 29.9 pg (ref 26.0–34.0)
MCHC: 32.8 g/dL (ref 30.0–36.0)
MCV: 91.3 fL (ref 80.0–100.0)
Platelets: 187 10*3/uL (ref 150–400)
RBC: 3.21 MIL/uL — ABNORMAL LOW (ref 4.22–5.81)
RDW: 14.6 % (ref 11.5–15.5)
WBC: 14.6 10*3/uL — ABNORMAL HIGH (ref 4.0–10.5)
nRBC: 0 % (ref 0.0–0.2)

## 2018-10-26 LAB — GLUCOSE, CAPILLARY
Glucose-Capillary: 109 mg/dL — ABNORMAL HIGH (ref 70–99)
Glucose-Capillary: 103 mg/dL — ABNORMAL HIGH (ref 70–99)

## 2018-10-26 MED ORDER — POTASSIUM CHLORIDE CRYS ER 20 MEQ PO TBCR
20.0000 meq | EXTENDED_RELEASE_TABLET | Freq: Once | ORAL | Status: AC
  Administered 2018-10-26: 20 meq via ORAL
  Filled 2018-10-26: qty 1

## 2018-10-26 NOTE — Progress Notes (Addendum)
      [address]       [place] [address]             [phone]        4 Days Post-Op Procedure(s) (LRB): CORONARY ARTERY BYPASS GRAFTING (CABG) times four using right and left internal mammary arteries, left radial artery, and right greater saphenous vein harvested endoscopically. (N/A) LEFT RADIAL ARTERY HARVEST (Left) TRANSESOPHAGEAL ECHOCARDIOGRAM (TEE) (N/A)  Subjective: Patient states has had intermittent left thumb pain and some numbness since surgery.  Objective: Vital signs in last 24 hours: Temp:  [97.9 F (36.6 C)-98.3 F (36.8 C)] 98.3 F (36.8 C) (07/27 0423) Pulse Rate:  [72-88] 75 (07/27 0615) Cardiac Rhythm: Normal sinus rhythm (07/27 0440) Resp:  [16-27] 17 (07/27 0615) BP: (99-169)/(55-122) 127/66 (07/27 0435) SpO2:  [89 %-97 %] 93 % (07/27 0615) Weight:  [121.8 kg] 121.8 kg (07/27 0423)  Pre op weight 113.4 kg Current Weight  10/26/18 121.8 kg       Intake/Output from previous day: 07/26 0701 - 07/27 0700 In: 1640 [P.O.:1640] Out: 2500 [Urine:2300; Chest Tube:200]   Physical Exam:  Cardiovascular: RRR Pulmonary: Slightly diminished bibasilar breath sounds Abdomen: Soft, non tender, bowel sounds present. Extremities: Mild bilateral lower extremity edema. Left arm wound clean and dry. Wounds: Sternal wound is clean and dry.  No erythema or signs of infection.  Lab Results: CBC: Recent Labs    10/24/18 0436 10/26/18 0311  WBC 16.5* 14.6*  HGB 11.5* 9.6*  HCT 35.7* 29.3*  PLT 142* 187   BMET:  Recent Labs    10/25/18 0807 10/26/18 0311  NA 137 137  K 3.9 3.7  CL 103 99  CO2 26 27  GLUCOSE 155* 118*  BUN 36* 42*  CREATININE 1.68* 1.81*  CALCIUM 7.7* 7.6*    PT/INR:  Lab Results  Component Value Date   INR 1.4 (H) 10/22/2018   INR 1.1 10/21/2018   INR 1.02 12/26/2012   ABG:  INR: Will add last result for INR, ABG once components are confirmed Will add last 4 CBG results once components are  confirmed  Assessment/Plan:  1. CV - S/p STEMI. Previous a fib. Maintaining SR in the 70's . On Amiodarone 400 mg bid, Isordil 10 mg tid, Lisinopril 2.5 mg daily, and Lopressor 25 mg bid. 2.  Pulmonary - On room air. CXR ordered for this am, but not taken yet. Encourage incentive spirometer. History of OSA-CPAP at night. Encourage incentive spirometer. 3. Volume Overload - On Lasix 40 mg IV bid. Per Dr. Orvan Seen, stop Lasix. He is also on Lisinopril so will stop for now (already given am dose). Will recheck BMET in am. 4.  Acute blood loss anemia - H and H this am decreased to 9.6 and 29.3 5. Creatinine this am slightly increased to 1.81 6. Supplement potassium 7. History of pre diabetes. CBGs 169/111/109. Pre op HGA1C 6. Will provide nutrition recommendations. Stop accu checks and SS PRN. 8. Regarding intermittent left thumb pain/numbness, related to takedown of left radial artery harvest. Likely secondary to branches of nerve (main nerve intact). Should improve with time. 9. Hope to discharge in am  [facility] 10/26/2018,7:09 AM [phone]

## 2018-10-26 NOTE — Progress Notes (Addendum)
Care plan reviewed. Pt is progressing. Pt is alert and present, he stated all of his family died from heart disease and he is the only one still alive. He has been trying to be very conscious about taking care of his health, but he's getting heart disease anyway. Active listening and emotional support given.    Afebrile, sinus rhythm on monitor, BP remains stable, room air SPO2 94-97%%, he uses home CPAP at night. His pain tolerated well with Tylenol and Oxycodone. He sleeps well tonight. Sternal wound is dry and clean. Left radial and left leg vein graft dressing intact,clean and dry. No acute distress noted tonight. Continue to monitor.  Oscar Lose, RN

## 2018-10-26 NOTE — Progress Notes (Signed)
CARDIAC REHAB PHASE I   PRE:  Rate/Rhythm: 31 SR             Up at door     MODE:  Ambulation: 400 ft   POST:  Rate/Rhythm: 79 SR  BP:  Supine: 170/67  Sitting:   Standing:    SaO2: 90-92%RA 1000-1107 Pt up at door. Came an hour earlier and pt requested pain med prior to walk. Got pain med and ready to walk. Pt walked 400 ft with steady gait. Stopped four times to rest due to SOB. Stated mask made it harder to breathe. Sats good on RA. To recliner after walk and answered questions pt had. Began education. Discussed sternal precautions and staying in the tube, IS and walking for ex. Discussed watching carbs. Discussed CRP 2 and referred to [facility] program. Mentioned App but pt prefers email to phone so did not refer for virtual ex program.  Wrote down how to view discharge video. Will continue ed tomorrow.    Graylon Good, RN BSN  10/26/2018 11:06 AM

## 2018-10-26 NOTE — Progress Notes (Signed)
Patient brought CPAP from home, in no distress.  Self manages machine.

## 2018-10-26 NOTE — Discharge Summary (Signed)
Physician Discharge Summary  Patient ID: Oscar [name] MRN: [id number] DOB/AGE: 12-15-1960 58 y.o.  Admit date: 10/21/2018 Discharge date: 10/27/2018  Admission Diagnoses:  Patient Active Problem List   Diagnosis Date Noted  . Acute ST elevation myocardial infarction (STEMI) of inferolateral wall ([place]) - Aborted 10/21/2018  . Diabetes mellitus type 2 with complications ([place]) 58/34/1962  . CAD, multiple vessel 10/21/2018  . Prolapsed internal hemorrhoids, grade 2 12/05/2016  . Chest pain 11/01/2015  . Ventral hernia 10/25/2014  . Fracture of left distal radius 04/22/2013  . 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 Obesity (BMI 30-39.9) 04/17/2012  . Incisional hernia, recurrent 02/24/2012  . Rectus diastasis 02/24/2012  . BACK PAIN, LUMBAR 11/16/2009  . Hyperlipidemia associated with type 2 diabetes mellitus ([place]) 05/24/2008  . Essential hypertension 05/24/2008  . ASTHMA 05/24/2008  . Sleep apnea 05/24/2008  . NEPHROLITHIASIS, HX OF 05/24/2008    Discharge Diagnoses:   Patient Active Problem List   Diagnosis Date Noted  . 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 Acute ST elevation myocardial infarction (STEMI) of inferolateral wall ([place]) - Aborted 10/21/2018  . Diabetes mellitus type 2 with complications ([place]) 58/97/9892  . CAD, multiple vessel 10/21/2018  . Prolapsed internal hemorrhoids, grade 2 12/05/2016  . Chest pain 11/01/2015  . Ventral hernia 10/25/2014  . Fracture of left distal radius 04/22/2013  . 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 Obesity (BMI 30-39.9) 04/17/2012  . Incisional hernia, recurrent 02/24/2012  . Rectus diastasis 02/24/2012  . BACK PAIN, LUMBAR 11/16/2009  . Hyperlipidemia associated with type 2 diabetes mellitus ([place]) 05/24/2008  . Essential hypertension 05/24/2008  . ASTHMA 05/24/2008  . Sleep apnea 05/24/2008  . NEPHROLITHIASIS, HX OF 05/24/2008   Discharged Condition: good  History of Present Illness:  Mr. Oscar [name] is a 58  yo white male with known history of prediabetes, obesity, HTN, and hyperlipidemia.  He developed chest pain last weekend, but was able to perform his normal exercise routine.  The patient developed increased severity of chest pain that radiated into his back and neck.  He called 911 and was transported via EMS and code STEMI was initiated and he was transferred to [facility].  He was taken emergently to the cath lab which showed severe multivessel CAD.  It was felt coronary bypass grafting would be indicated and TCTS consult was requested.   Hospital Course:   The patient was evaluated by Dr. Orvan [name].  He was in agreement that coronary bypass grafting would be the best treatment option.  The risks and benefits of the procedure were explained to the patient and he was agreeable to proceed.  The patient was taken to the operating room.  He underwent CABG x 4 utilizing LIMA to LAD, RIMA to PDA, Radial artery to Om, and SVG to Diagonal.  He also underwent endoscopic harvest of greater saphenous vein from his right thigh and open radial artery harvest from his left arm.  He tolerated the procedure without difficulty and was taken to the SICU in stable condition.  The patient was extubated the evening of surgery.  During his stay in the SICU the patient was weaned off NTG as tolerated.  The patient was started on Imdur for radial graft.  His chest tubes and arterial lines were removed without difficulty.  He developed decreased urinary output and was treated with IV fluid bolus.  He developed Atrial Fibrillation.  He was treated with IV Amiodarone.  He converted to NSR.  His pacing wires  were removed without difficulty.  He was medically stable for transfer to the telemetry unit on 10/26/2018.  He was diuresed for volume overload. This was discontinued prior to discharge due to elevation in creatinine with peak up to 1.81.  Repeat BMET showed creatinine improved to 1.47.  He remains volume overloaded and will  be started on a week of Lasix.  He developed brief episodes of Atrial Flutter and Fibrillation.  He was started on Eliquis for this.  His right EVH site skin edges dehisced.  There is no evidence of infection present.  He is ambulating independently.  He is medically stable for discharge home today.  Significant Diagnostic Studies: angiography:    Severe three-vessel CAD including proximal LAD, mid to distal circumflex, and mid RCA.   Acute inferior STEMI presentation with spontaneous resolution of ST elevation  Mild inferior wall hypokinesis.  EF greater than 50%.  Treatments: surgery:    Coronary Artery Bypass Grafting x 4              Left Internal Mammary Artery to Distal Left Anterior Descending Coronary Artery; Reverse Saphenous Vein Graft to Diagonal Branch Coronary Artery; pedicled right internal mammary artery to Posterior Descending Coronary Artery; left radial artery to distal Obtuse Marginal Branch of Left Circumflex Coronary Artery; Endoscopic Vein Harvest from right Thigh; left radial artery harvesting  Discharge Exam: Blood pressure (!) 151/76, pulse 76, temperature 97.9 F (36.6 C), temperature source Oral, resp. rate 19, height 6\' 2"  (1.88 m), weight 121.3 kg, SpO2 95 %.  General appearance: alert, cooperative and no distress Heart: regular rate and rhythm Lungs: clear to auscultation bilaterally Abdomen: soft, non-tender; bowel sounds normal; no masses,  no organomegaly Extremities: edema trace Wound: clean, some drainage from sternotomy and radial artery site  Discharge disposition: 01-Home or Self Care  Discharge Medications:  The patient has been discharged on:   1.Beta Blocker:  Yes [ x  ]                              No   [   ]                              If No, reason:  2.Ace Inhibitor/ARB: Yes [   ]                                     No  [  x  ]                                     If No, reason: elevated creatinine  3.Statin:   Yes [ x  ]                   No  [   ]                  If No, reason:  4.Ecasa:  Yes  [ x  ]                  No   [   ]                  If No, reason:  Discharge Instructions    Amb Referral to Cardiac Rehabilitation   Complete by: As directed    Diagnosis:  CABG STEMI     CABG X ___: 4   After initial evaluation and assessments completed: Virtual Based Care may be provided alone or in conjunction with Phase 2 Cardiac Rehab based on patient barriers.: Yes     Allergies as of 10/27/2018      Reactions   Acetaminophen Other (See Comments)   Bleeding issues   Ibuprofen Other (See Comments)   Bleeding issues   Advair Diskus [fluticasone-salmeterol] Cough   Latex Itching      Medication List    STOP taking these medications   amLODipine 5 MG tablet Commonly known as: NORVASC   hydrochlorothiazide 25 MG tablet Commonly known as: HYDRODIURIL     TAKE these medications   acetaminophen 500 MG tablet Commonly known as: TYLENOL Take 2 tablets (1,000 mg total) by mouth every 6 (six) hours as needed.   acidophilus Caps capsule Take 1 capsule by mouth 2 (two) times daily.   ALPRAZolam 1 MG tablet Commonly known as: XANAX Take 1 mg by mouth at bedtime as needed for sleep.   amiodarone 200 MG tablet Commonly known as: PACERONE Take 2 tablets (400 mg total) by mouth 2 (two) times daily. X 7 days, then decrease to 200 mg BID x 7 days, then 200 mg daily   apixaban 5 MG Tabs tablet Commonly known as: ELIQUIS Take 1 tablet (5 mg total) by mouth 2 (two) times daily.   ARIMIDEX PO Take 0.25 mg by mouth See admin instructions. MON and THUR   aspirin EC 81 MG tablet Take 1 tablet (81 mg total) by mouth daily.   colchicine 0.6 MG tablet Take 1 tablet (0.6 mg total) by mouth daily.   DHEA 50 MG Caps Take 50 mg by mouth daily.   ezetimibe 10 MG tablet Commonly known as: ZETIA Take 10 mg by mouth daily.   furosemide 40 MG tablet Commonly known as: Lasix Take 1 tablet (40 mg total) by  mouth daily.   isosorbide dinitrate 10 MG tablet Commonly known as: ISORDIL Take 1 tablet (10 mg total) by mouth 3 (three) times daily.   metFORMIN 500 MG tablet Commonly known as: GLUCOPHAGE Take 1,000 mg by mouth 2 (two) times daily. Pt takes one tablet in the morning and two at night.   metoprolol tartrate 25 MG tablet Commonly known as: LOPRESSOR Take 1 tablet (25 mg total) by mouth 2 (two) times daily.   Oxycodone HCl 10 MG Tabs Take 0.5-1 tablets (5-10 mg total) by mouth every 4 (four) hours as needed for severe pain.   Potassium Chloride ER 20 MEQ Tbcr Take 20 mEq by mouth daily.   QC TUMERIC COMPLEX PO Take 1 capsule by mouth daily.   rosuvastatin 40 MG tablet Commonly known as: CRESTOR Take 1 tablet (40 mg total) by mouth daily. What changed: how much to take   vitamin C 500 MG tablet Commonly known as: ASCORBIC ACID Take 1,000 mg by mouth 2 (two) times daily.      Follow-up Information    Wonda Olds, MD Follow up on 10/30/2018.   Specialty: Cardiothoracic Surgery Why: Appointment is at 2:00, please get CXR at 1:30 at [place] located on first floor of our office building Contact information: [place] [address] [phone]        Erlene Quan, PA-C Follow up on  11/09/2018.   Specialties: Cardiology, Radiology Why: Appointment is at 2:15 Contact information: [address] [place] [address] [phone]           Signed: Ellwood Handler 10/27/2018, 8:05 AM

## 2018-10-27 LAB — BASIC METABOLIC PANEL
Anion gap: 8 (ref 5–15)
BUN: 35 mg/dL — ABNORMAL HIGH (ref 6–20)
CO2: 27 mmol/L (ref 22–32)
Calcium: 7.5 mg/dL — ABNORMAL LOW (ref 8.9–10.3)
Chloride: 101 mmol/L (ref 98–111)
Creatinine, Ser: 1.47 mg/dL — ABNORMAL HIGH (ref 0.61–1.24)
GFR calc Af Amer: >60 mL/min (ref >60)
GFR calc non Af Amer: 52 mL/min — ABNORMAL LOW (ref >60)
Glucose, Bld: 110 mg/dL — ABNORMAL HIGH (ref 70–99)
Potassium: 3.5 mmol/L (ref 3.5–5.1)
Sodium: 136 mmol/L (ref 135–145)

## 2018-10-27 LAB — POCT I-STAT 7, (LYTES, BLD GAS, ICA,H+H)
Acid-base deficit: 3 mmol/L — ABNORMAL HIGH (ref 0.0–2.0)
Bicarbonate: 23.5 mmol/L (ref 20.0–28.0)
Calcium, Ion: 1.08 mmol/L — ABNORMAL LOW (ref 1.15–1.40)
HCT: 39 % (ref 39.0–52.0)
Hemoglobin: 13.3 g/dL (ref 13.0–17.0)
O2 Saturation: 97 %
Potassium: 4.6 mmol/L (ref 3.5–5.1)
Sodium: 139 mmol/L (ref 135–145)
TCO2: 25 mmol/L (ref 22–32)
pCO2 arterial: 45.8 mmHg (ref 32.0–48.0)
pH, Arterial: 7.319 — ABNORMAL LOW (ref 7.350–7.450)
pO2, Arterial: 95 mmHg (ref 83.0–108.0)

## 2018-10-27 MED ORDER — ASPIRIN EC 81 MG PO TBEC: 81.0000 mg | DELAYED_RELEASE_TABLET | Freq: Every day | ORAL | Status: DC

## 2018-10-27 MED ORDER — COLCHICINE 0.6 MG PO TABS: 0.6000 mg | ORAL_TABLET | Freq: Every day | ORAL | 0 refills | Status: DC

## 2018-10-27 MED ORDER — ISOSORBIDE DINITRATE 10 MG PO TABS: 10.0000 mg | ORAL_TABLET | Freq: Three times a day (TID) | ORAL | 0 refills | Status: DC

## 2018-10-27 MED ORDER — FUROSEMIDE 40 MG PO TABS: 40.0000 mg | ORAL_TABLET | Freq: Every day | ORAL | 0 refills | Status: DC

## 2018-10-27 MED ORDER — METOPROLOL TARTRATE 25 MG PO TABS: 25.0000 mg | ORAL_TABLET | Freq: Two times a day (BID) | ORAL | 3 refills | Status: DC

## 2018-10-27 MED ORDER — APIXABAN 5 MG PO TABS: 5.0000 mg | ORAL_TABLET | Freq: Two times a day (BID) | ORAL | 0 refills | Status: DC

## 2018-10-27 MED ORDER — POTASSIUM CHLORIDE ER 20 MEQ PO TBCR: 20.0000 meq | EXTENDED_RELEASE_TABLET | Freq: Every day | ORAL | 0 refills | Status: DC

## 2018-10-27 MED ORDER — AMIODARONE HCL 200 MG PO TABS: 400.0000 mg | ORAL_TABLET | Freq: Two times a day (BID) | ORAL | 1 refills | Status: DC

## 2018-10-27 MED ORDER — ACETAMINOPHEN 500 MG PO TABS: 1000.0000 mg | ORAL_TABLET | Freq: Four times a day (QID) | ORAL | 0 refills | Status: DC | PRN

## 2018-10-27 MED ORDER — OXYCODONE HCL 10 MG PO TABS: 5.0000 mg | ORAL_TABLET | ORAL | 0 refills | Status: DC | PRN

## 2018-10-27 MED ORDER — APIXABAN 5 MG PO TABS
5.0000 mg | ORAL_TABLET | Freq: Two times a day (BID) | ORAL | Status: DC
  Administered 2018-10-27: 10:00:00 5 mg via ORAL
  Filled 2018-10-27: qty 1

## 2018-10-27 MED FILL — Lidocaine HCl Local Soln Prefilled Syringe 100 MG/5ML (2%): INTRAMUSCULAR | Qty: 5 | Status: AC

## 2018-10-27 MED FILL — Electrolyte-R (PH 7.4) Solution: INTRAVENOUS | Qty: 4000 | Status: AC

## 2018-10-27 MED FILL — Mannitol IV Soln 20%: INTRAVENOUS | Qty: 500 | Status: AC

## 2018-10-27 MED FILL — Sodium Chloride IV Soln 0.9%: INTRAVENOUS | Qty: 2000 | Status: AC

## 2018-10-27 MED FILL — Heparin Sodium (Porcine) Inj 1000 Unit/ML: INTRAMUSCULAR | Qty: 10 | Status: AC

## 2018-10-27 MED FILL — Heparin Sodium (Porcine) Inj 1000 Unit/ML: INTRAMUSCULAR | Qty: 30 | Status: AC

## 2018-10-27 MED FILL — Sodium Bicarbonate IV Soln 8.4%: INTRAVENOUS | Qty: 50 | Status: AC

## 2018-10-27 NOTE — TOC Benefit Eligibility Note (Signed)
Transition of Care [facility]) Benefit Eligibility Note    Patient Details  Name: Oscar [name] MRN: [id number] Date of Birth: 1960-05-02   Medication/Dose: ELIQUIS  5 MG BID  Covered?: Yes  Tier: 2 Drug  Prescription Coverage [facility]  AND [facility]  Spoke with Person/Company/Phone Number:: DESIREE  @ [facility] # [phone]  Co-Pay: $ 35.00  Prior Approval: No  Deductible: (NO DEDUCTIBLE  WITH PLAN)  Additional Notes: 90 DAY SUPPLY FOR M/O $ 87.50    Memory Argue Phone Number: 10/27/2018, 11:47 AM

## 2018-10-27 NOTE — Progress Notes (Signed)
ANTICOAGULATION CONSULT NOTE - Initial Consult  Pharmacy Consult for Eliquis Indication: atrial fibrillation  Allergies  Allergen Reactions  . Acetaminophen Other (See Comments)    Bleeding issues  . Ibuprofen Other (See Comments)    Bleeding issues  . Advair Diskus [Fluticasone-Salmeterol] Cough  . Latex Itching    Patient Measurements: Height: 6\' 2"  (188 cm) Weight: 267 lb 6.4 oz (121.3 kg)(scale B) IBW/kg (Calculated) : 82.2  Vital Signs: Temp: 97.9 F (36.6 C) (07/28 0524) Temp Source: Oral (07/28 0524) BP: 151/76 (07/28 0524) Pulse Rate: 76 (07/28 0524)  Labs: Recent Labs    10/25/18 0807 10/26/18 0311 10/27/18 0432  HGB  --  9.6*  --   HCT  --  29.3*  --   PLT  --  187  --   CREATININE 1.68* 1.81* 1.47*    Estimated Creatinine Clearance: 75.8 mL/min (A) (by C-G formula based on SCr of 1.47 mg/dL (H)).   Medical History: Past Medical History:  Diagnosis Date  . Adenomatous polyp   . Chickenpox   . 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 Diverticulitis   . Hepatitis A    as a child, no current liver problems  . Hyperlipidemia   . Hypertension    he also sees Dr. Lynita Lombard in [place], [place]   . Lower GI bleed    Hx: of  . Nephrolithiasis    2-3 kidney stones in past  . Obesity   . Pneumonia   . Prediabetes   . Sleep apnea 2003   CPAP machine, pt does not know settings     Assessment: 45 yoM admitted as code STEMI now s/p CABG 7/23. Pt has had recurrent post-operative AFib, pharmacy asked to dose apixaban.   Goal of Therapy:  Stroke Prevention Monitor platelets by anticoagulation protocol: Yes   Plan:  -Stop enoxaparin -Begin apixaban 5mg  BID -Recommend reducing aspirin to 81mg   -F/U care management for copay assistance   Arrie Senate, PharmD, BCPS Clinical Pharmacist Please check AMION for all [facility] Pharmacy numbers 10/27/2018

## 2018-10-27 NOTE — Progress Notes (Signed)
Pt chest tube sutures removed. Pt tolerated well. All incisions painted with Betadine. Vitals stable. Pt denies any pain currently. IV removed and intact.  Telebox removed. CCMD notfied. Pt provided education and discharge information. Pt has all belongings. Valet called to tx pt via wheelcahir to meet ride.  Jerald Kief, RN

## 2018-10-27 NOTE — Progress Notes (Addendum)
Progress Note  Patient Name: Oscar [name] Date of Encounter: 10/27/2018  Primary Cardiologist: Kirk Ruths, MD   Subjective   Feeling well this morning.   Inpatient Medications    Scheduled Meds: . acetaminophen  1,000 mg Oral Q6H   Or  . acetaminophen (TYLENOL) oral liquid 160 mg/5 mL  1,000 mg Per Tube Q6H  . acidophilus  1 capsule Oral BID  . amiodarone  400 mg Oral BID  . apixaban  5 mg Oral BID  . aspirin EC  325 mg Oral Daily   Or  . aspirin  324 mg Per Tube Daily  . bisacodyl  10 mg Oral Daily   Or  . bisacodyl  10 mg Rectal Daily  . colchicine  0.6 mg Oral Daily  . docusate sodium  200 mg Oral Daily  . isosorbide dinitrate  10 mg Oral TID  . metoprolol tartrate  25 mg Oral BID   Or  . metoprolol tartrate  12.5 mg Per Tube BID  . pantoprazole  40 mg Oral Daily  . rosuvastatin  40 mg Oral Daily  . sodium chloride flush  3 mL Intravenous Q12H  . sodium chloride flush  3 mL Intravenous Q12H   Continuous Infusions: . sodium chloride    . sodium chloride     PRN Meds: sodium chloride, ALPRAZolam, metoprolol tartrate, morphine injection, ondansetron (ZOFRAN) IV, oxyCODONE, sodium chloride flush, sodium chloride flush, traMADol   Vital Signs    Vitals:   10/26/18 1941 10/27/18 0524 10/27/18 0956 10/27/18 1043  BP: (!) 120/51 (!) 151/76 (!) 151/76 (!) 133/58  Pulse: 87 76 76 80  Resp: 18 19  19   Temp: 98.2 F (36.8 C) 97.9 F (36.6 C)  98.3 F (36.8 C)  TempSrc: Oral Oral  Oral  SpO2: 91% 95%  95%  Weight:  121.3 kg    Height:        Intake/Output Summary (Last 24 hours) at 10/27/2018 1131 Last data filed at 10/26/2018 1352 Gross per 24 hour  Intake 180 ml  Output -  Net 180 ml   Last 3 Weights 10/27/2018 10/26/2018 10/25/2018  Weight (lbs) 267 lb 6.4 oz 268 lb 8 oz (No Data)  Weight (kg) 121.292 kg 121.791 kg (No Data)      Telemetry    SR currently, episodes of Afib overnight - Personally Reviewed  ECG    N/a- Personally Reviewed   Physical Exam  Pleasant WM GEN: No acute distress.   Neck: No JVD Cardiac: RRR, no murmurs, rubs, or gallops. Healing sternotomy site.  Respiratory: Clear to auscultation bilaterally. GI: Soft, nontender, non-distended  MS: No edema; No deformity. Neuro:  Nonfocal  Psych: Normal affect   Labs    High Sensitivity Troponin:   Recent Labs  Lab 10/21/18 0021 10/21/18 0354  TROPONINIHS 180* 2,189*      Cardiac EnzymesNo results for input(s): TROPONINI in the last 168 hours. No results for input(s): TROPIPOC in the last 168 hours.   Chemistry Recent Labs  Lab 10/21/18 0021  10/22/18 0246  10/25/18 0807 10/26/18 0311 10/27/18 0432  NA 137   < > 140   < > 137 137 136  K 3.5   < > 4.2   < > 3.9 3.7 3.5  CL 99   < > 107   < > 103 99 101  CO2 25   < > 23   < > 26 27 27   GLUCOSE 196*   < > 110*   < >  155* 118* 110*  BUN 16   < > 12   < > 36* 42* 35*  CREATININE 1.20   < > 1.05   < > 1.68* 1.81* 1.47*  CALCIUM 9.9   < > 8.9   < > 7.7* 7.6* 7.5*  PROT 7.8  --  6.7  --   --   --   --   ALBUMIN 4.1  --  3.6  --   --   --   --   AST 40  --  78*  --   --   --   --   ALT 24  --  24  --   --   --   --   ALKPHOS 63  --  42  --   --   --   --   BILITOT 0.5  --  0.8  --   --   --   --   GFRNONAA >60   < > >60   < > 44* 40* 52*  GFRAA >60   < > >60   < > 51* 47* >60  ANIONGAP 13   < > 10   < > 8 11 8    < > = values in this interval not displayed.     Hematology Recent Labs  Lab 10/23/18 1415 10/24/18 0436 10/26/18 0311  WBC 18.3* 16.5* 14.6*  RBC 3.82* 3.79* 3.21*  HGB 11.7* 11.5* 9.6*  HCT 35.3* 35.7* 29.3*  MCV 92.4 94.2 91.3  MCH 30.6 30.3 29.9  MCHC 33.1 32.2 32.8  RDW 14.9 15.0 14.6  PLT 124* 142* 187    BNP Recent Labs  Lab 10/22/18 0246  BNP 132.0*     DDimer No results for input(s): DDIMER in the last 168 hours.   Radiology    Dg Chest 2 View  Result Date: 10/26/2018 CLINICAL DATA:  Prior CABG.  Sore chest. EXAM: CHEST - 2 VIEW COMPARISON:   10/25/2018. FINDINGS: Interval removal of right IJ sheath, bilateral chest tubes, and mediastinal drainage catheter. Prior CABG. Stable cardiomegaly. Bibasilar atelectasis/infiltrates. Small bilateral pleural effusions. No pneumothorax. IMPRESSION: 1. Interim removal of right IJ sheath, bilateral chest tubes, mediastinal drainage catheter. 2.  Prior CABG.  Stable cardiomegaly. 3.  Mild bibasilar atelectasis/infiltrates. Electronically Signed   By: Marcello Moores  Register   On: 10/26/2018 08:28    Cardiac Studies   TTE: 10/21/18  IMPRESSIONS    1. The left ventricle has normal systolic function with an ejection fraction of 60-65%. The cavity size was normal. There is moderately increased left ventricular wall thickness. Left ventricular diastolic Doppler parameters are consistent with  pseudonormalization.  2. The right ventricle has normal systolic function. The cavity was normal. There is no increase in right ventricular wall thickness.  3. The aortic valve is tricuspid. Moderate thickening of the aortic valve. Moderate calcification of the aortic valve.  4. The aorta is normal in size and structure.   Cath: 10/21/18   Severe three-vessel CAD including proximal LAD, mid to distal circumflex, and mid RCA.   Acute inferior STEMI presentation with spontaneous resolution of ST elevation  Mild inferior wall hypokinesis.  EF greater than 50%.  RECOMMENDATIONS:   Diabetic with three-vessel CAD including proximal LAD, prior history of recurrent diverticular bleeding making the patient a high bleed risk on dual antiplatelet therapy.  And considering his particular clinical situation I have decided to recommend multivessel coronary artery bypass grafting with LIMA to LAD as the best long-term option for  symptom control and survival benefits.  IV Aggrastat has been started  P2 Y 12 therapy was withheld  IV nitroglycerin  If recurrent ST elevation will need stenting of the circumflex although he  currently has TIMI grade III flow and hopefully surgery within the next 24 to 36 hours will be possible.  Will consult T CTS first thing this a.m.  Diagnostic Dominance: Co-dominant  Patient Profile     58 y.o. male with a strong family history of premature coronary artery disease, DM2, HTN, HLD, and prior GI bleed who presents with 2 days of stuttering chest pain found to have an inferolateral STEMI. Significant 3v CAD with CABG.   Assessment & Plan    1. CAD s/p 4v CABG: progressing well. Planned for discharge today. On 81mg  ASA, BB and statin. Not a candidate for plavix (with STEMI) given the need for [place].   2. Post op Afib: required IV amio, now tolerating PO amiodarone 400mg  BID. Eliquis 5mg  BID started at discharge. ASA reduced back to 81mg  daily.   3. HL: on high dose statin  4. HTN: stable with current therapy  CARDIOLOGY RECOMMENDATIONS:  Discharge is anticipated in the next 48 hours. Recommendations for medications and follow up:  Discharge Medications: Continue medications as they are currently [facility] in the [facility]. Exceptions to the above:  none  Follow Up: The patient's Primary Cardiologist is Kirk Ruths, MD   Follow up in the office in 2 week(s).  Signed,  Reino Bellis, NP  11:40 AM 10/27/2018  [facility]  Patient seen and examined. Agree with assessment and plan. Day 5 S/P CABG with 3 arterial conduits (LIMA, RIMA,Radial); for DC today. ASAS reduced to 81 mg with eliquis for PAF.    Troy Sine, MD, [facility] 10/27/2018 11:51 AM

## 2018-10-27 NOTE — Progress Notes (Signed)
[phone] Checked with pt to see if any questions re ed done yesterday. Reviewed walking for ex, heart healthy food choices, sternal precautions and IS. Referred to [place] CRP 2. Understanding voiced by pt. Graylon Good RN BSN 10/27/2018 8:36 AM

## 2018-10-27 NOTE — Progress Notes (Signed)
RN informed by Horris Latino at [employer] at [phone] that patient was in "A flutter" from Springer to 0503. Patient currently in Normal Sinus Rhythm on the monitor HR 69.   Will continue to monitor.

## 2018-10-27 NOTE — Discharge Instructions (Signed)
Discharge Instructions:  1. You may shower, please wash incisions daily with soap and water and keep dry.  If you wish to cover wounds with dressing you may do so but please keep clean and change daily.  No tub baths or swimming until incisions have completely healed.  If your incisions become red or develop any drainage please call our office at [phone]  2. No Driving until cleared by Dr. Orvan Seen office and you are no longer using narcotic pain medications  3. Monitor your weight daily.. Please use the same scale and weigh at same time... If you gain 5-10 lbs in 48 hours with associated lower extremity swelling, please contact our office at [phone]  4. Fever of 101.5 for at least 24 hours with no source, please contact our office at [phone]    5. Activity- up as tolerated, please walk at least 3 times per day.  Avoid strenuous activity, no lifting, pushing, or pulling with your arms over 8-10 lbs for a minimum of 6 weeks  6. If any questions or concerns arise, please do not hesitate to contact our office at [phone]   Prediabetes Eating Plan Prediabetes is a condition that causes blood sugar (glucose) levels to be higher than normal. This increases the risk for developing diabetes. In order to prevent diabetes from developing, your health care provider may recommend a diet and other lifestyle changes to help you:  Control your blood glucose levels.  Improve your cholesterol levels.  Manage your blood pressure. Your health care provider may recommend working with a diet and nutrition specialist (dietitian) to make a meal plan that is best for you. What are tips for following this plan? Lifestyle  Set weight loss goals with the help of your health care team. It is recommended that most people with prediabetes lose 7% of their current body weight.  Exercise for at least 30 minutes at least 5 days a week.  Attend a support group or seek ongoing support from a mental  health counselor.  Take over-the-counter and prescription medicines only as told by your health care provider. Reading food labels  Read food labels to check the amount of fat, salt (sodium), and sugar in prepackaged foods. Avoid foods that have: ? Saturated fats. ? Trans fats. ? Added sugars.  Avoid foods that have more than 300 milligrams (mg) of sodium per serving. Limit your daily sodium intake to less than 2,300 mg each day. Shopping  Avoid buying pre-made and processed foods. Cooking  Cook with olive oil. Do not use butter, lard, or ghee.  Bake, broil, grill, or boil foods. Avoid frying. Meal planning   Work with your dietitian to develop an eating plan that is right for you. This may include: ? Tracking how many calories you take in. Use a food diary, notebook, or mobile application to track what you eat at each meal. ? Using the glycemic index (GI) to plan your meals. The index tells you how quickly a food will raise your blood glucose. Choose low-GI foods. These foods take a longer time to raise blood glucose.  Consider following a Mediterranean diet. This diet includes: ? Several servings each day of fresh fruits and vegetables. ? Eating fish at least twice a week. ? Several servings each day of whole grains, beans, nuts, and seeds. ? Using olive oil instead of other fats. ? Moderate alcohol consumption. ? Eating small amounts of red meat and whole-fat dairy.  If you have high blood pressure, you may  need to limit your sodium intake or follow a diet such as the DASH eating plan. DASH is an eating plan that aims to lower high blood pressure. What foods are recommended? The items listed below may not be a complete list. Talk with your dietitian about what dietary choices are best for you. Grains Whole grains, such as whole-wheat or whole-grain breads, crackers, cereals, and pasta. Unsweetened oatmeal. Bulgur. Barley. Quinoa. Brown rice. Corn or whole-wheat flour  tortillas or taco shells. Vegetables Lettuce. Spinach. Peas. Beets. Cauliflower. Cabbage. Broccoli. Carrots. Tomatoes. Squash. Eggplant. Herbs. Peppers. Onions. Cucumbers. Brussels sprouts. Fruits Berries. Bananas. Apples. Oranges. Grapes. Papaya. Mango. Pomegranate. Kiwi. Grapefruit. Cherries. Meats and other protein foods Seafood. Poultry without skin. Lean cuts of pork and beef. Tofu. Eggs. Nuts. Beans. Dairy Low-fat or fat-free dairy products, such as yogurt, cottage cheese, and cheese. Beverages Water. Tea. Coffee. Sugar-free or diet soda. Seltzer water. Lowfat or no-fat milk. Milk alternatives, such as soy or almond milk. Fats and oils Olive oil. Canola oil. Sunflower oil. Grapeseed oil. Avocado. Walnuts. Sweets and desserts Sugar-free or low-fat pudding. Sugar-free or low-fat ice cream and other frozen treats. Seasoning and other foods Herbs. Sodium-free spices. Mustard. Relish. Low-fat, low-sugar ketchup. Low-fat, low-sugar barbecue sauce. Low-fat or fat-free mayonnaise. What foods are not recommended? The items listed below may not be a complete list. Talk with your dietitian about what dietary choices are best for you. Grains Refined white flour and flour products, such as bread, pasta, snack foods, and cereals. Vegetables Canned vegetables. Frozen vegetables with butter or cream sauce. Fruits Fruits canned with syrup. Meats and other protein foods Fatty cuts of meat. Poultry with skin. Breaded or fried meat. Processed meats. Dairy Full-fat yogurt, cheese, or milk. Beverages Sweetened drinks, such as sweet iced tea and soda. Fats and oils Butter. Lard. Ghee. Sweets and desserts Baked goods, such as cake, cupcakes, pastries, cookies, and cheesecake. Seasoning and other foods Spice mixes with added salt. Ketchup. Barbecue sauce. Mayonnaise. Summary  To prevent diabetes from developing, you may need to make diet and other lifestyle changes to help control blood sugar,  improve cholesterol levels, and manage your blood pressure.  Set weight loss goals with the help of your health care team. It is recommended that most people with prediabetes lose 7 percent of their current body weight.  Consider following a Mediterranean diet that includes plenty of fresh fruits and vegetables, whole grains, beans, nuts, seeds, fish, lean meat, low-fat dairy, and healthy oils. This information is not intended to replace advice given to you by your health care provider. Make sure you discuss any questions you have with your health care provider. Document Released: 08/02/2014 Document Revised: 07/10/2018 Document Reviewed: 05/22/2016 Elsevier Patient Education  2020 [employer] on my medicine - ELIQUIS (apixaban)  This medication education was reviewed with me or my healthcare representative as part of my discharge preparation.  Why was Eliquis prescribed for you? Eliquis was prescribed for you to reduce the risk of a blood clot forming that can cause a stroke if you have a medical condition called atrial fibrillation (a type of irregular heartbeat).  What do You need to know about Eliquis ? Take your Eliquis TWICE DAILY - one tablet in the morning and one tablet in the evening with or without food. If you have difficulty swallowing the tablet whole please discuss with your pharmacist how to take the medication safely.  Take Eliquis exactly as prescribed by your doctor and DO NOT  stop taking Eliquis without talking to the doctor who prescribed the medication.  Stopping may increase your risk of developing a stroke.  Refill your prescription before you run out.  After discharge, you should have regular check-up appointments with your healthcare provider that is prescribing your Eliquis.  In the future your dose may need to be changed if your kidney function or weight changes by a significant amount or as you get older.  What do you do if you miss a  dose? If you miss a dose, take it as soon as you remember on the same day and resume taking twice daily.  Do not take more than one dose of ELIQUIS at the same time to make up a missed dose.  Important Safety Information A possible side effect of Eliquis is bleeding. You should call your healthcare provider right away if you experience any of the following: ? Bleeding from an injury or your nose that does not stop. ? Unusual colored urine (red or dark brown) or unusual colored stools (red or black). ? Unusual bruising for unknown reasons. ? A serious fall or if you hit your head (even if there is no bleeding).  Some medicines may interact with Eliquis and might increase your risk of bleeding or clotting while on Eliquis. To help avoid this, consult your healthcare provider or pharmacist prior to using any new prescription or non-prescription medications, including herbals, vitamins, non-steroidal anti-inflammatory drugs (NSAIDs) and supplements.  This website has more information on Eliquis (apixaban): http://www.eliquis.com/eliquis/home

## 2018-10-27 NOTE — TOC Transition Note (Signed)
Transition of Care [facility]) - CM/SW Discharge Note Marvetta Gibbons RN, BSN Transitions of Care Unit 4E- RN Case Manager [phone]   Patient Details  Name: Oscar [name] MRN: [id number] Date of Birth: [birth year]  Transition of Care [facility]) CM/SW Contact:  Dawayne Patricia, RN Phone Number: 10/27/2018, 11:17 AM   Clinical Narrative:    Pt s/p CABG stable for transition home today, referral received for Eliquis needs- benefits check submitted for copay cost- script has been sent to [place] provided both 30 day free card and copay assist card for Eliquis.    Final next level of care: Home/Self Care Barriers to Discharge: No Barriers Identified   Patient Goals and CMS Choice Patient states their goals for this hospitalization and ongoing recovery are:: home and recover   Choice offered to / list presented to : NA  Discharge Placement  Home                     Discharge Plan and Services   Discharge Planning Services: CM Consult, Medication Assistance Post Acute Care Choice: NA          DME Arranged: N/A DME Agency: NA       HH Arranged: NA HH Agency: NA        Social Determinants of Health (SDOH) Interventions     Readmission Risk Interventions Readmission Risk Prevention Plan 10/27/2018  Transportation Screening Complete  PCP or Specialist Appt within 5-7 Days Complete  Home Care Screening Complete  Medication Review (RN CM) Complete  Some recent data might be hidden

## 2018-10-27 NOTE — Progress Notes (Signed)
      [address]       [place] [address]             [phone]      5 Days Post-Op Procedure(s) (LRB): CORONARY ARTERY BYPASS GRAFTING (CABG) times four using right and left internal mammary arteries, left radial artery, and right greater saphenous vein harvested endoscopically. (N/A) LEFT RADIAL ARTERY HARVEST (Left) TRANSESOPHAGEAL ECHOCARDIOGRAM (TEE) (N/A)   Subjective:  No new complaints.  Did notice palpitations overnight  Objective: Vital signs in last 24 hours: Temp:  [97.9 F (36.6 C)-98.2 F (36.8 C)] 97.9 F (36.6 C) (07/28 0524) Pulse Rate:  [74-87] 76 (07/28 0524) Cardiac Rhythm: Atrial flutter (07/28 0503) Resp:  [15-19] 19 (07/28 0524) BP: (120-170)/(51-92) 151/76 (07/28 0524) SpO2:  [91 %-97 %] 95 % (07/28 0524) Weight:  [121.3 kg] 121.3 kg (07/28 0524)  Intake/Output from previous day: 07/27 0701 - 07/28 0700 In: 340 [P.O.:340] Out: -   General appearance: alert, cooperative and no distress Heart: regular rate and rhythm Lungs: clear to auscultation bilaterally Abdomen: soft, non-tender; bowel sounds normal; no masses,  no organomegaly Extremities: edema trace Wound: clean, some drainage from sternotomy and radial artery site  Lab Results: Recent Labs    10/26/18 0311  WBC 14.6*  HGB 9.6*  HCT 29.3*  PLT 187   BMET:  Recent Labs    10/26/18 0311 10/27/18 0432  NA 137 136  K 3.7 3.5  CL 99 101  CO2 27 27  GLUCOSE 118* 110*  BUN 42* 35*  CREATININE 1.81* 1.47*  CALCIUM 7.6* 7.5*    PT/INR: No results for input(s): LABPROT, INR in the last 72 hours. ABG    Component Value Date/Time   PHART 7.320 (L) 10/22/2018 1922   HCO3 20.3 10/22/2018 1922   TCO2 21 (L) 10/22/2018 1922   ACIDBASEDEF 5.0 (H) 10/22/2018 1922   O2SAT 94.0 10/22/2018 1922   CBG (last 3)  Recent Labs    10/25/18 2354 10/26/18 0430 10/26/18 1159  GLUCAP 111* 109* 103*    Assessment/Plan: S/P Procedure(s) (LRB): CORONARY ARTERY  BYPASS GRAFTING (CABG) times four using right and left internal mammary arteries, left radial artery, and right greater saphenous vein harvested endoscopically. (N/A) LEFT RADIAL ARTERY HARVEST (Left) TRANSESOPHAGEAL ECHOCARDIOGRAM (TEE) (N/A)  1. CV- NSR currently, A. Flutter/fib overnight- on Amiodarone, Imdur for radial graft, Lopressor, will start Eliquis 2. Pulm- no acute issues, continue IS 3. Renal- creatinine improved down to 1.47, will start oral lasix 40 mg daily for 1 week 4. ID-superficial EVH site dehiscence, continue dressing changes as needed, no evidence of infection 5. 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 Dispo- patient stable, will d/c home today   LOS: 6 days    Ellwood Handler 10/27/2018

## 2018-10-29 ENCOUNTER — Telehealth (HOSPITAL_COMMUNITY): Payer: Self-pay

## 2018-10-29 ENCOUNTER — Other Ambulatory Visit: Payer: Self-pay | Admitting: Cardiothoracic Surgery

## 2018-10-29 DIAGNOSIS — Z951 Presence of aortocoronary bypass graft: Secondary | ICD-10-CM

## 2018-10-29 NOTE — Telephone Encounter (Signed)
Pt insurance is active and benefits verified through [place] 0, DED $2,500/0 met, out of pocket $5,000/$75.87 met, co-insurance 0. no pre-authorization required, REF# [id number] C  Will contact patient to see if he is interested in the Cardiac Rehab Program. If interested, patient will need to complete follow up appt. Once completed, patient will be contacted for scheduling upon review by the RN Navigator.

## 2018-10-29 NOTE — Telephone Encounter (Signed)
Pt is really not interested in the in house cardiac rehab but pt is interested in the virtual, will contact pt after his f/u visit on 11/09/2018 to schedule.

## 2018-10-30 ENCOUNTER — Other Ambulatory Visit: Payer: Self-pay

## 2018-10-30 ENCOUNTER — Encounter: Payer: Self-pay | Admitting: Cardiothoracic Surgery

## 2018-10-30 ENCOUNTER — Ambulatory Visit
Admission: RE | Admit: 2018-10-30 | Discharge: 2018-10-30 | Disposition: A | Discharge status: Home / Self Care | Payer: No Typology Code available for payment source | Source: Ambulatory Visit | Attending: Cardiothoracic Surgery | Admitting: Cardiothoracic Surgery

## 2018-10-30 ENCOUNTER — Ambulatory Visit (INDEPENDENT_AMBULATORY_CARE_PROVIDER_SITE_OTHER): Payer: Self-pay | Admitting: Cardiothoracic Surgery

## 2018-10-30 VITALS — BP 166/78 | HR 83 | Temp 97.7°F | Resp 18 | Ht 74.0 in | Wt 264.0 lb

## 2018-10-30 DIAGNOSIS — Z951 Presence of aortocoronary bypass graft: Secondary | ICD-10-CM

## 2018-10-30 NOTE — Progress Notes (Signed)
[address]       [place] [address]             [phone]     CARDIOTHORACIC SURGERY OFFICE NOTE  Referring Provider is Belva Crome, MD Primary Cardiologist is Kirk Ruths, MD PCP is System, Pcp Not In   HPI:  58 yo man s/p cabg x 4 8 days ago recently discharged from hospital and returns for f/u. No complaints except right thigh SVG harvest site oozing clear fluid. No chest pain; doing some light exercise/walking daily.    Current Outpatient Medications  Medication Sig Dispense Refill  . acetaminophen (TYLENOL) 500 MG tablet Take 2 tablets (1,000 mg total) by mouth every 6 (six) hours as needed. 30 tablet 0  . ALPRAZolam (XANAX) 1 MG tablet Take 1 mg by mouth at bedtime as needed for sleep.    Marland Kitchen amiodarone (PACERONE) 200 MG tablet Take 2 tablets (400 mg total) by mouth 2 (two) times daily. X 7 days, then decrease to 200 mg BID x 7 days, then 200 mg daily 90 tablet 1  . Anastrozole (ARIMIDEX PO) Take 0.25 mg by mouth See admin instructions. MON and THUR    . apixaban (ELIQUIS) 5 MG TABS tablet Take 1 tablet (5 mg total) by mouth 2 (two) times daily. 60 tablet 0  . aspirin EC 81 MG tablet Take 1 tablet (81 mg total) by mouth daily.    . colchicine 0.6 MG tablet Take 1 tablet (0.6 mg total) by mouth daily. 30 tablet 0  . DHEA 50 MG CAPS Take 50 mg by mouth daily.    Marland Kitchen ezetimibe (ZETIA) 10 MG tablet Take 10 mg by mouth daily.    . furosemide (LASIX) 40 MG tablet Take 1 tablet (40 mg total) by mouth daily. 7 tablet 0  . isosorbide dinitrate (ISORDIL) 10 MG tablet Take 1 tablet (10 mg total) by mouth 3 (three) times daily. 90 tablet 0  . metFORMIN (GLUCOPHAGE) 500 MG tablet Take 1,000 mg by mouth 2 (two) times daily. Pt takes one tablet in the morning and two at night.    . metoprolol tartrate (LOPRESSOR) 25 MG tablet Take 1 tablet (25 mg total) by mouth 2 (two) times daily. 60 tablet 3  . oxyCODONE 10 MG TABS Take 0.5-1 tablets (5-10 mg total) by mouth  every 4 (four) hours as needed for severe pain. 30 tablet 0  . potassium chloride 20 MEQ TBCR Take 20 mEq by mouth daily. 7 tablet 0  . rosuvastatin (CRESTOR) 40 MG tablet Take 1 tablet (40 mg total) by mouth daily. (Patient taking differently: Take 20 mg by mouth daily. ) 90 tablet 3  . Turmeric (QC TUMERIC COMPLEX PO) Take 1 capsule by mouth daily.    . vitamin C (ASCORBIC ACID) 500 MG tablet Take 1,000 mg by mouth 2 (two) times daily.    Marland Kitchen acidophilus (RISAQUAD) CAPS capsule Take 1 capsule by mouth 2 (two) times daily.     No current facility-administered medications for this visit.       Physical Exam:   BP (!) 166/78 (BP Location: Right Arm, Patient Position: Sitting, Cuff Size: Large)   Pulse 83   Temp 97.7 F (36.5 C)   Resp 18   Ht 6\' 2"  (1.88 m)   Wt 119.7 kg   SpO2 94% Comment: RA  BMI 33.90 kg/m   General:  Well-appearing, NAD  Chest:   Cta  CV:  Rrr; confirmed NSR by strip  Incisions:  C/d/i x right thigh wound with scant drainage. Non erythematous  Abdomen:  sntnd  Extremities:  Very mild edema  Diagnostic Tests:  CXR with clear lung fields   Impression:  Doing well after CABG.   Plan:  Continue present management; f/u in 3 weeks.   I spent in excess of 30 minutes during the conduct of this office consultation and >50% of this time involved direct face-to-face encounter with the patient for counseling and/or coordination of their care.  Level 2                 10 minutes Level 3                 15 minutes Level 4                 25 minutes Level 5                 40 minutes  B. Murvin Natal, MD 10/30/2018 9:25 PM

## 2018-11-08 ENCOUNTER — Other Ambulatory Visit: Payer: Self-pay

## 2018-11-08 ENCOUNTER — Encounter (HOSPITAL_COMMUNITY): Payer: Self-pay | Admitting: Emergency Medicine

## 2018-11-08 ENCOUNTER — Inpatient Hospital Stay (HOSPITAL_COMMUNITY)
Admission: EM | Admit: 2018-11-08 | Discharge: 2018-11-11 | DRG: 377 | Disposition: A | Discharge status: Home / Self Care | Payer: No Typology Code available for payment source | Attending: Internal Medicine | Admitting: Internal Medicine

## 2018-11-08 ENCOUNTER — Emergency Department (HOSPITAL_COMMUNITY): Payer: No Typology Code available for payment source

## 2018-11-08 DIAGNOSIS — Z951 Presence of aortocoronary bypass graft: Secondary | ICD-10-CM

## 2018-11-08 DIAGNOSIS — Z8249 Family history of ischemic heart disease and other diseases of the circulatory system: Secondary | ICD-10-CM

## 2018-11-08 DIAGNOSIS — D62 Acute posthemorrhagic anemia: Secondary | ICD-10-CM | POA: Diagnosis present

## 2018-11-08 DIAGNOSIS — E669 Obesity, unspecified: Secondary | ICD-10-CM | POA: Diagnosis present

## 2018-11-08 DIAGNOSIS — N179 Acute kidney failure, unspecified: Secondary | ICD-10-CM | POA: Diagnosis present

## 2018-11-08 DIAGNOSIS — I4891 Unspecified atrial fibrillation: Secondary | ICD-10-CM | POA: Diagnosis present

## 2018-11-08 DIAGNOSIS — N183 Chronic kidney disease, stage 3 (moderate): Secondary | ICD-10-CM | POA: Diagnosis present

## 2018-11-08 DIAGNOSIS — I10 Essential (primary) hypertension: Secondary | ICD-10-CM | POA: Diagnosis present

## 2018-11-08 DIAGNOSIS — F419 Anxiety disorder, unspecified: Secondary | ICD-10-CM | POA: Diagnosis present

## 2018-11-08 DIAGNOSIS — Z7984 Long term (current) use of oral hypoglycemic drugs: Secondary | ICD-10-CM

## 2018-11-08 DIAGNOSIS — K5731 Diverticulosis of large intestine without perforation or abscess with bleeding: Secondary | ICD-10-CM | POA: Diagnosis present

## 2018-11-08 DIAGNOSIS — I2510 Atherosclerotic heart disease of native coronary artery without angina pectoris: Secondary | ICD-10-CM | POA: Diagnosis present

## 2018-11-08 DIAGNOSIS — J189 Pneumonia, unspecified organism: Secondary | ICD-10-CM | POA: Diagnosis present

## 2018-11-08 DIAGNOSIS — Z823 Family history of stroke: Secondary | ICD-10-CM

## 2018-11-08 DIAGNOSIS — Z20828 Contact with and (suspected) exposure to other viral communicable diseases: Secondary | ICD-10-CM | POA: Diagnosis present

## 2018-11-08 DIAGNOSIS — D649 Anemia, unspecified: Secondary | ICD-10-CM

## 2018-11-08 DIAGNOSIS — K222 Esophageal obstruction: Secondary | ICD-10-CM

## 2018-11-08 DIAGNOSIS — Z87442 Personal history of urinary calculi: Secondary | ICD-10-CM | POA: Diagnosis not present

## 2018-11-08 DIAGNOSIS — Z955 Presence of coronary angioplasty implant and graft: Secondary | ICD-10-CM

## 2018-11-08 DIAGNOSIS — K264 Chronic or unspecified duodenal ulcer with hemorrhage: Principal | ICD-10-CM | POA: Diagnosis present

## 2018-11-08 DIAGNOSIS — Z7901 Long term (current) use of anticoagulants: Secondary | ICD-10-CM | POA: Diagnosis not present

## 2018-11-08 DIAGNOSIS — Z6831 Body mass index (BMI) 31.0-31.9, adult: Secondary | ICD-10-CM

## 2018-11-08 DIAGNOSIS — K3189 Other diseases of stomach and duodenum: Secondary | ICD-10-CM | POA: Diagnosis not present

## 2018-11-08 DIAGNOSIS — B159 Hepatitis A without hepatic coma: Secondary | ICD-10-CM | POA: Diagnosis present

## 2018-11-08 DIAGNOSIS — I252 Old myocardial infarction: Secondary | ICD-10-CM

## 2018-11-08 DIAGNOSIS — J440 Chronic obstructive pulmonary disease with acute lower respiratory infection: Secondary | ICD-10-CM | POA: Diagnosis present

## 2018-11-08 DIAGNOSIS — K2980 Duodenitis without bleeding: Secondary | ICD-10-CM | POA: Diagnosis present

## 2018-11-08 DIAGNOSIS — K648 Other hemorrhoids: Secondary | ICD-10-CM | POA: Diagnosis present

## 2018-11-08 DIAGNOSIS — I129 Hypertensive chronic kidney disease with stage 1 through stage 4 chronic kidney disease, or unspecified chronic kidney disease: Secondary | ICD-10-CM | POA: Diagnosis present

## 2018-11-08 DIAGNOSIS — K922 Gastrointestinal hemorrhage, unspecified: Secondary | ICD-10-CM | POA: Diagnosis present

## 2018-11-08 DIAGNOSIS — Z888 Allergy status to other drugs, medicaments and biological substances status: Secondary | ICD-10-CM

## 2018-11-08 DIAGNOSIS — Z91040 Latex allergy status: Secondary | ICD-10-CM

## 2018-11-08 DIAGNOSIS — E119 Type 2 diabetes mellitus without complications: Secondary | ICD-10-CM | POA: Diagnosis present

## 2018-11-08 DIAGNOSIS — K921 Melena: Secondary | ICD-10-CM | POA: Diagnosis not present

## 2018-11-08 DIAGNOSIS — K260 Acute duodenal ulcer with hemorrhage: Secondary | ICD-10-CM | POA: Diagnosis not present

## 2018-11-08 DIAGNOSIS — E785 Hyperlipidemia, unspecified: Secondary | ICD-10-CM | POA: Diagnosis present

## 2018-11-08 DIAGNOSIS — G4733 Obstructive sleep apnea (adult) (pediatric): Secondary | ICD-10-CM | POA: Diagnosis present

## 2018-11-08 DIAGNOSIS — I959 Hypotension, unspecified: Secondary | ICD-10-CM | POA: Diagnosis present

## 2018-11-08 DIAGNOSIS — Z713 Dietary counseling and surveillance: Secondary | ICD-10-CM

## 2018-11-08 DIAGNOSIS — J45909 Unspecified asthma, uncomplicated: Secondary | ICD-10-CM | POA: Diagnosis not present

## 2018-11-08 DIAGNOSIS — Z886 Allergy status to analgesic agent status: Secondary | ICD-10-CM

## 2018-11-08 DIAGNOSIS — K269 Duodenal ulcer, unspecified as acute or chronic, without hemorrhage or perforation: Secondary | ICD-10-CM | POA: Diagnosis not present

## 2018-11-08 DIAGNOSIS — Z7982 Long term (current) use of aspirin: Secondary | ICD-10-CM

## 2018-11-08 DIAGNOSIS — Z833 Family history of diabetes mellitus: Secondary | ICD-10-CM

## 2018-11-08 DIAGNOSIS — Z79899 Other long term (current) drug therapy: Secondary | ICD-10-CM

## 2018-11-08 HISTORY — DX: Acute myocardial infarction, unspecified: I21.9

## 2018-11-08 LAB — CBC WITH DIFFERENTIAL/PLATELET
Abs Immature Granulocytes: 0.56 10*3/uL — ABNORMAL HIGH (ref 0.00–0.07)
Basophils Absolute: 0 10*3/uL (ref 0.0–0.1)
Basophils Relative: 0 %
Eosinophils Absolute: 0 10*3/uL (ref 0.0–0.5)
Eosinophils Relative: 0 %
HCT: 15.9 % — ABNORMAL LOW (ref 39.0–52.0)
Hemoglobin: 4.7 g/dL — CL (ref 13.0–17.0)
Immature Granulocytes: 2 %
Lymphocytes Relative: 6 %
Lymphs Abs: 1.6 10*3/uL (ref 0.7–4.0)
MCH: 29.9 pg (ref 26.0–34.0)
MCHC: 29.6 g/dL — ABNORMAL LOW (ref 30.0–36.0)
MCV: 101.3 fL — ABNORMAL HIGH (ref 80.0–100.0)
Monocytes Absolute: 1.4 10*3/uL — ABNORMAL HIGH (ref 0.1–1.0)
Monocytes Relative: 6 %
Neutro Abs: 22.3 10*3/uL — ABNORMAL HIGH (ref 1.7–7.7)
Neutrophils Relative %: 86 %
Platelets: 518 10*3/uL — ABNORMAL HIGH (ref 150–400)
RBC: 1.57 MIL/uL — ABNORMAL LOW (ref 4.22–5.81)
RDW: 19.7 % — ABNORMAL HIGH (ref 11.5–15.5)
WBC: 25.9 10*3/uL — ABNORMAL HIGH (ref 4.0–10.5)
nRBC: 5.6 % — ABNORMAL HIGH (ref 0.0–0.2)

## 2018-11-08 LAB — COMPREHENSIVE METABOLIC PANEL
ALT: 20 U/L (ref 0–44)
AST: 23 U/L (ref 15–41)
Albumin: 2.5 g/dL — ABNORMAL LOW (ref 3.5–5.0)
Alkaline Phosphatase: 49 U/L (ref 38–126)
Anion gap: 16 — ABNORMAL HIGH (ref 5–15)
BUN: 74 mg/dL — ABNORMAL HIGH (ref 6–20)
CO2: 16 mmol/L — ABNORMAL LOW (ref 22–32)
Calcium: 8.4 mg/dL — ABNORMAL LOW (ref 8.9–10.3)
Chloride: 100 mmol/L (ref 98–111)
Creatinine, Ser: 2.04 mg/dL — ABNORMAL HIGH (ref 0.61–1.24)
GFR calc Af Amer: 40 mL/min — ABNORMAL LOW (ref >60)
GFR calc non Af Amer: 35 mL/min — ABNORMAL LOW (ref >60)
Glucose, Bld: 152 mg/dL — ABNORMAL HIGH (ref 70–99)
Potassium: 4.5 mmol/L (ref 3.5–5.1)
Sodium: 132 mmol/L — ABNORMAL LOW (ref 135–145)
Total Bilirubin: 0.3 mg/dL (ref 0.3–1.2)
Total Protein: 5.4 g/dL — ABNORMAL LOW (ref 6.5–8.1)

## 2018-11-08 LAB — TROPONIN I (HIGH SENSITIVITY)
Troponin I (High Sensitivity): 40 ng/L — ABNORMAL HIGH (ref <18)
Troponin I (High Sensitivity): 47 ng/L — ABNORMAL HIGH (ref <18)

## 2018-11-08 LAB — PREPARE RBC (CROSSMATCH)

## 2018-11-08 LAB — SARS CORONAVIRUS 2 BY RT PCR (HOSPITAL ORDER, PERFORMED IN ~~LOC~~ HOSPITAL LAB): SARS Coronavirus 2: NEGATIVE

## 2018-11-08 LAB — LACTIC ACID, PLASMA
Lactic Acid, Venous: 7.3 mmol/L (ref 0.5–1.9)
Lactic Acid, Venous: 6.2 mmol/L (ref 0.5–1.9)

## 2018-11-08 MED ORDER — SODIUM CHLORIDE 0.9 % IV SOLN
80.0000 mg | Freq: Once | INTRAVENOUS | Status: AC
  Administered 2018-11-08: 80 mg via INTRAVENOUS
  Filled 2018-11-08: qty 80

## 2018-11-08 MED ORDER — FUROSEMIDE 10 MG/ML IJ SOLN: 40.0000 mg | Freq: Once | INTRAMUSCULAR | Status: DC

## 2018-11-08 MED ORDER — SODIUM CHLORIDE 0.9% IV SOLUTION: Freq: Once | INTRAVENOUS | Status: DC

## 2018-11-08 MED ORDER — SODIUM CHLORIDE 0.9 % IV SOLN
8.0000 mg/h | INTRAVENOUS | Status: DC
  Administered 2018-11-08 – 2018-11-10 (×4): 8 mg/h via INTRAVENOUS
  Filled 2018-11-08 (×4): qty 80

## 2018-11-08 MED ORDER — SODIUM CHLORIDE 0.9% IV SOLUTION
Freq: Once | INTRAVENOUS | Status: AC
  Administered 2018-11-09: 02:00:00 via INTRAVENOUS

## 2018-11-08 MED ORDER — PANTOPRAZOLE SODIUM 40 MG IV SOLR: 40.0000 mg | Freq: Two times a day (BID) | INTRAVENOUS | Status: DC

## 2018-11-08 NOTE — ED Notes (Signed)
Patient is resting comfortably. 

## 2018-11-08 NOTE — ED Notes (Signed)
Informed provider of critical hemoglobin, provider bedside.

## 2018-11-08 NOTE — ED Notes (Signed)
Informed provider of lactic 

## 2018-11-08 NOTE — ED Triage Notes (Signed)
Report from [facility]> pt had MI on 7/28 and CABG on 7/30.  Has been feeling great since surgery.  Reports generalized weakness, dizziness, and exertional SOB that started Saturday around 11am.  Also reports BP in the 70s at home.  105/80 with EMS.

## 2018-11-08 NOTE — ED Notes (Signed)
Pt reports he has resumed drinking large amounts of water again (now that he is below his pre-surgery weight.)  We was able to exercise until two days ago when he began to experience the shortness of breath.  Has been placed on 2L O2 due to O2 levels being about 90%

## 2018-11-08 NOTE — Significant Event (Signed)
Critical Care Called to Admit. Patient is alert/oriented, following commands, Hemoglobin 4.7, previous 9.6. Discharged s/p CABG on 7/27 on Eliquis. Per Patient has had black stools for 3-5 days. GI consulted, on Protonix gtt, Receiving first unit RBC now. BP 134/43. Lactic Acid down-trending, repeat at 0000. Asked ED to consult Hospitalist for admission to progressive care. If condition were to change please re-consult.   Hayden Pedro, AGACNP-BC [facility]  PCCM Pgr: [phone]

## 2018-11-08 NOTE — ED Provider Notes (Signed)
[facility] EMERGENCY DEPARTMENT Provider Note   CSN: [id number] Arrival date & time: 11/08/18  1945    History   Chief Complaint Chief Complaint  Patient presents with  . Weakness  . Dizziness  . Shortness of Breath    HPI Oscar [name] is a 58 y.o. male.      The history is provided by the patient, the EMS personnel and medical records. No language interpreter was used.  Weakness Associated symptoms: dizziness and shortness of breath   Dizziness Associated symptoms: shortness of breath and weakness   Shortness of Breath  Oscar [name] is a 58 y.o. male who presents to the Emergency Department complaining of weakness. He presents to the emergency department by EMS for evaluation of weakness, dizziness and shortness of breath. He does status post CABG 10/22/18., Discharged home on July 27. Yesterday when he got up to urinate he developed profound shortness of breath with dizziness. Shortness of breath is significantly worse with any exertion. His symptoms have persisted and worsened and he called EMS today due to inability to exert himself without significant shortness of breath. He denies any chest pain. He has experienced relatively low blood pressures since hospital discharge and his blood pressure medications were held as of yesterday. His last doses were yesterday. He reports a blood pressure of 70 systolic at home. EMS report blood pressure of 829 systolic. He has dark stools. No hematochezia or melena. Past Medical History:  Diagnosis Date  . Adenomatous polyp   . Chickenpox   . 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 Diverticulitis   . Hepatitis A    as a child, no current liver problems  . Hyperlipidemia   . Hypertension    he also sees Dr. Lynita Lombard in [place], [place]   . Lower GI bleed    Hx: of  . Myocardial infarction ([place])   . Nephrolithiasis    2-3 kidney stones in past  . Obesity   . Pneumonia   . Prediabetes   . Sleep apnea 2003    CPAP machine, pt does not know settings    Patient Active Problem List   Diagnosis Date Noted  . Acute GI bleeding 11/08/2018  . 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 Acute ST elevation myocardial infarction (STEMI) of inferolateral wall ([place]) - Aborted 10/21/2018  . Diabetes mellitus type 2 with complications ([place]) 56/21/3086  . CAD, multiple vessel 10/21/2018  . Prolapsed internal hemorrhoids, grade 2 12/05/2016  . Chest pain 11/01/2015  . Ventral hernia 10/25/2014  . Fracture of left distal radius 04/22/2013  . 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 Obesity (BMI 30-39.9) 04/17/2012  . Incisional hernia, recurrent 02/24/2012  . Rectus diastasis 02/24/2012  . BACK PAIN, LUMBAR 11/16/2009  . Hyperlipidemia associated with type 2 diabetes mellitus ([place]) 05/24/2008  . Essential hypertension 05/24/2008  . ASTHMA 05/24/2008  . Sleep apnea 05/24/2008  . NEPHROLITHIASIS, HX OF 05/24/2008    Past Surgical History:  Procedure Laterality Date  . APPENDECTOMY   10-56yrs ago  . CLOSED REDUCTION FINGER WITH PERCUTANEOUS PINNING Left 04/22/2013   Procedure: LEFT WRIST CLOSED REDUCTION CONVERTED TO OPEN REDUCTION INTERNAL FIXATION, LEFT CARPAL TUNNEL RELEASE;  Surgeon: Linna Hoff, MD;  Location: [facility];  Service: Orthopedics;  Laterality: Left;  . COLONOSCOPY  07-09-09   benign polyp repeat 5 years, Dr.Sam Penelope Coop  . CORONARY ARTERY BYPASS GRAFT N/A 10/22/2018   Procedure: CORONARY ARTERY BYPASS GRAFTING (CABG) times four using right and left  internal mammary arteries, left radial artery, and right greater saphenous vein harvested endoscopically.;  Surgeon: Wonda Olds, MD;  Location: [facility];  Service: Open Heart Surgery;  Laterality: N/A;  . CORONARY ARTERY BYPASS GRAFT    . CORONARY/GRAFT ACUTE MI REVASCULARIZATION N/A 10/21/2018   Procedure: Coronary/Graft Acute MI Revascularization;  Surgeon: Belva Crome, MD;  Location: [facility];  Service: Cardiovascular;  Laterality: N/A;   . HERNIA REPAIR     repaired x 4  . INSERTION OF MESH  04/09/2012   Procedure: INSERTION OF MESH;  Surgeon: Earnstine Regal, MD;  Location: [facility];  Service: General;  Laterality: N/A;  . LEFT HEART CATH AND CORONARY ANGIOGRAPHY N/A 10/21/2018   Procedure: LEFT HEART CATH AND CORONARY ANGIOGRAPHY;  Surgeon: Belva Crome, MD;  Location: [facility];  Service: Cardiovascular;  Laterality: N/A;  . RADIAL ARTERY HARVEST Left 10/22/2018   Procedure: LEFT RADIAL ARTERY HARVEST;  Surgeon: Wonda Olds, MD;  Location: [facility];  Service: Open Heart Surgery;  Laterality: Left;  . TEE WITHOUT CARDIOVERSION N/A 10/22/2018   Procedure: TRANSESOPHAGEAL ECHOCARDIOGRAM (TEE);  Surgeon: Wonda Olds, MD;  Location: [facility];  Service: Open Heart Surgery;  Laterality: N/A;  . TONSILLECTOMY  as child  . UVULECTOMY  yrs ago   for snoring  . VENTRAL HERNIA REPAIR  04/09/2012   Procedure: LAPAROSCOPIC VENTRAL HERNIA;  Surgeon: Earnstine Regal, MD;  Location: [facility];  Service: General;  Laterality: N/A;  Laparoscopic Ventral Incisional Hernia Repair with Mesh        Home Medications    Prior to Admission medications   Medication Sig Start Date End Date Taking? Authorizing Provider  acetaminophen (TYLENOL) 500 MG tablet Take 2 tablets (1,000 mg total) by mouth every 6 (six) hours as needed. 10/27/18   Barrett, Erin R, PA-C  acidophilus (RISAQUAD) CAPS capsule Take 1 capsule by mouth 2 (two) times daily.    [provider]  ALPRAZolam Duanne Moron) 1 MG tablet Take 1 mg by mouth at bedtime as needed for sleep. 09/23/18   [provider]  amiodarone (PACERONE) 200 MG tablet Take 2 tablets (400 mg total) by mouth 2 (two) times daily. X 7 days, then decrease to 200 mg BID x 7 days, then 200 mg daily 10/27/18   Barrett, Erin R, PA-C  Anastrozole (ARIMIDEX PO) Take 0.25 mg by mouth See admin instructions. MON and THUR    [provider]  apixaban (ELIQUIS) 5 MG TABS tablet Take 1 tablet (5 mg  total) by mouth 2 (two) times daily. 10/27/18   Barrett, Lodema Hong, PA-C  aspirin EC 81 MG tablet Take 1 tablet (81 mg total) by mouth daily. 10/27/18   Barrett, Erin R, PA-C  colchicine 0.6 MG tablet Take 1 tablet (0.6 mg total) by mouth daily. 10/27/18   Barrett, Erin R, PA-C  DHEA 50 MG CAPS Take 50 mg by mouth daily.    [provider]  ezetimibe (ZETIA) 10 MG tablet Take 10 mg by mouth daily.    [provider]  furosemide (LASIX) 40 MG tablet Take 1 tablet (40 mg total) by mouth daily. 10/27/18 10/27/19  Barrett, Erin R, PA-C  isosorbide dinitrate (ISORDIL) 10 MG tablet Take 1 tablet (10 mg total) by mouth 3 (three) times daily. 10/27/18   Barrett, Erin R, PA-C  metFORMIN (GLUCOPHAGE) 500 MG tablet Take 1,000 mg by mouth 2 (two) times daily. Pt takes one tablet in the morning  and two at night.    [provider]  metoprolol tartrate (LOPRESSOR) 25 MG tablet Take 1 tablet (25 mg total) by mouth 2 (two) times daily. 10/27/18   Barrett, Erin R, PA-C  oxyCODONE 10 MG TABS Take 0.5-1 tablets (5-10 mg total) by mouth every 4 (four) hours as needed for severe pain. 10/27/18   Barrett, Erin R, PA-C  potassium chloride 20 MEQ TBCR Take 20 mEq by mouth daily. 10/27/18   Barrett, Erin R, PA-C  rosuvastatin (CRESTOR) 40 MG tablet Take 1 tablet (40 mg total) by mouth daily. Patient taking differently: Take 20 mg by mouth daily.  10/24/15   Lelon Perla, MD  Turmeric (QC TUMERIC COMPLEX PO) Take 1 capsule by mouth daily.    [provider]  vitamin C (ASCORBIC ACID) 500 MG tablet Take 1,000 mg by mouth 2 (two) times daily.    [provider]    Family History Family History  Problem Relation Age of Onset  . Stroke Mother        late 50s  . CAD Mother        MI in her 71s  . CAD Father 46  . Marfan syndrome Brother   . Diabetes Brother   . Hypertension Brother   . Obesity Brother   . Colon cancer Neg Hx     Social History Social History   Tobacco Use  .  Smoking status: Never Smoker  . Smokeless tobacco: Never Used  Substance Use Topics  . Alcohol use: Yes    Alcohol/week: 0.0 standard drinks    Comment: 2-3 glasses 2-3 times a week-wine; occasional liquior drinks  . Drug use: No     Allergies   Acetaminophen, Ibuprofen, Advair diskus [fluticasone-salmeterol], and Latex   Review of Systems Review of Systems  Respiratory: Positive for shortness of breath.   Neurological: Positive for dizziness and weakness.  All other systems reviewed and are negative.    Physical Exam Updated Vital Signs BP 131/72   Pulse (!) 101   Temp 99.4 F (37.4 C) (Oral)   Resp (!) 25   SpO2 100%   Physical Exam Vitals signs and nursing note reviewed.  Constitutional:      Appearance: He is well-developed.  HENT:     Head: Normocephalic and atraumatic.     Mouth/Throat:     Mouth: Mucous membranes are dry.  Cardiovascular:     Rate and Rhythm: Normal rate and regular rhythm.     Heart sounds: Murmur present.  Pulmonary:     Effort: Pulmonary effort is normal. No respiratory distress.     Breath sounds: Normal breath sounds.     Comments: Tachypnea Abdominal:     Palpations: Abdomen is soft.     Tenderness: There is no abdominal tenderness. There is no guarding or rebound.  Genitourinary:    Comments: Black stool in rectal vault Musculoskeletal:        General: No tenderness.     Comments: Healing surgical wound to the left arm. 2+ radial and DP pulses bilaterally. Trace edema to bilateral lower extremities  Skin:    General: Skin is warm and dry.     Coloration: Skin is pale.  Neurological:     Mental Status: He is alert and oriented to person, place, and time.  Psychiatric:        Behavior: Behavior normal.      ED Treatments / Results  Labs (all labs ordered are listed, but only  abnormal results are displayed) Labs Reviewed  COMPREHENSIVE METABOLIC PANEL - Abnormal; Notable for the following components:      Result Value    Sodium 132 (*)    CO2 16 (*)    Glucose, Bld 152 (*)    BUN 74 (*)    Creatinine, Ser 2.04 (*)    Calcium 8.4 (*)    Total Protein 5.4 (*)    Albumin 2.5 (*)    GFR calc non Af Amer 35 (*)    GFR calc Af Amer 40 (*)    Anion gap 16 (*)    All other components within normal limits  CBC WITH DIFFERENTIAL/PLATELET - Abnormal; Notable for the following components:   WBC 25.9 (*)    RBC 1.57 (*)    Hemoglobin 4.7 (*)    HCT 15.9 (*)    MCV 101.3 (*)    MCHC 29.6 (*)    RDW 19.7 (*)    Platelets 518 (*)    nRBC 5.6 (*)    Neutro Abs 22.3 (*)    Monocytes Absolute 1.4 (*)    Abs Immature Granulocytes 0.56 (*)    All other components within normal limits  LACTIC ACID, PLASMA - Abnormal; Notable for the following components:   Lactic Acid, Venous 7.3 (*)    All other components within normal limits  LACTIC ACID, PLASMA - Abnormal; Notable for the following components:   Lactic Acid, Venous 6.2 (*)    All other components within normal limits  TROPONIN I (HIGH SENSITIVITY) - Abnormal; Notable for the following components:   Troponin I (High Sensitivity) 40 (*)    All other components within normal limits  TROPONIN I (HIGH SENSITIVITY) - Abnormal; Notable for the following components:   Troponin I (High Sensitivity) 47 (*)    All other components within normal limits  SARS CORONAVIRUS 2 (HOSPITAL ORDER, [facility])  LACTIC ACID, PLASMA  LACTIC ACID, PLASMA  PROTIME-INR  FIBRINOGEN  HEPARIN LEVEL (UNFRACTIONATED)  BASIC METABOLIC PANEL  MAGNESIUM  PHOSPHORUS  POC OCCULT BLOOD, ED  TYPE AND SCREEN  PREPARE RBC (CROSSMATCH)  PREPARE RBC (CROSSMATCH)    EKG EKG Interpretation  Date/Time:  Sunday November 08 2018 19:56:03 EDT Ventricular Rate:  103 PR Interval:    QRS Duration: 119 QT Interval:  376 QTC Calculation: 493 R Axis:   10 Text Interpretation:  Sinus or ectopic atrial tachycardia Nonspecific intraventricular conduction delay ST  elevation suggests acute pericarditis Confirmed by Quintella Reichert [phone]) on 11/08/2018 8:16:05 PM   Radiology Dg Chest Port 1 View  Result Date: 11/08/2018 CLINICAL DATA:  58 year old male with shortness of breath and weakness. EXAM: PORTABLE CHEST 1 VIEW COMPARISON:  Chest radiograph dated 10/30/2018 FINDINGS: Trace pleural effusions may be present. No focal consolidation or pneumothorax. Stable mild cardiomegaly. Median sternotomy wires and CABG vascular clips. No acute osseous pathology. Old healed left clavicular fracture. IMPRESSION: No focal consolidation.  No interval change. Electronically Signed   By: Anner Crete M.D.   On: 11/08/2018 20:18    Procedures Procedures (including critical care time) CRITICAL CARE Performed by: Quintella Reichert   Total critical care time: 60 minutes  Critical care time was exclusive of separately billable procedures and treating other patients.  Critical care was necessary to treat or prevent imminent or life-threatening deterioration.  Critical care was time spent personally by me on the following activities: development of treatment plan with patient and/or surrogate as well as nursing, discussions with  consultants, evaluation of patient's response to treatment, examination of patient, obtaining history from patient or surrogate, ordering and performing treatments and interventions, ordering and review of laboratory studies, ordering and review of radiographic studies, pulse oximetry and re-evaluation of patient's condition.  Medications Ordered in ED Medications  0.9 %  sodium chloride infusion (Manually program via Guardrails IV Fluids) (has no administration in time range)  pantoprazole (PROTONIX) 80 mg in sodium chloride 0.9 % 250 mL (0.32 mg/mL) infusion (8 mg/hr Intravenous New Bag/Given 11/08/18 2312)  pantoprazole (PROTONIX) injection 40 mg (has no administration in time range)  furosemide (LASIX) injection 40 mg (has no administration in  time range)  0.9 %  sodium chloride infusion (Manually program via Guardrails IV Fluids) (has no administration in time range)  pantoprazole (PROTONIX) 80 mg in sodium chloride 0.9 % 100 mL IVPB (0 mg Intravenous Stopped 11/08/18 2308)     Initial Impression / Assessment and Plan / ED Course  I have reviewed the triage vital signs and the nursing notes.  Pertinent labs & imaging results that were available during my care of the patient were reviewed by me and considered in my medical decision making (see chart for details).        Patient presents to the emergency department for evaluation of weakness, shortness of breath. He does report having black stools at home but denies any hematochezia or melena. He is pale, tachycardic on evaluation. Labs are significant for found anemia with a hemoglobin of four. BUN and creatinine are up compared to baseline. Concern for upper G.I. bleed. He was started on Protonix bolus and drip and transfuse PR BC's. Discussed with Dr. Havery Moros with gastroenterology - recommends transfusion 3 units prbc, Protonix drip, npo. No Andexxa  given after discussion with G.I - last dose of eliquis am on 8/9. Critical care consulted for potential admission. He was evaluated by critical care in the emergency department with recommendation to admit to step down unit. Hospitalist consulted for admission. Patient and wife updated findings of studies and critical nature of illness.  Final Clinical Impressions(s) / ED Diagnoses   Final diagnoses:  Acute upper GI bleed  Symptomatic anemia    ED Discharge Orders    None       Quintella Reichert, MD 11/09/18 0030

## 2018-11-09 ENCOUNTER — Ambulatory Visit: Payer: No Typology Code available for payment source | Admitting: Cardiology

## 2018-11-09 ENCOUNTER — Encounter (HOSPITAL_COMMUNITY): Payer: Self-pay | Admitting: Internal Medicine

## 2018-11-09 ENCOUNTER — Telehealth: Payer: Self-pay | Admitting: Internal Medicine

## 2018-11-09 DIAGNOSIS — Z7901 Long term (current) use of anticoagulants: Secondary | ICD-10-CM

## 2018-11-09 DIAGNOSIS — K922 Gastrointestinal hemorrhage, unspecified: Secondary | ICD-10-CM

## 2018-11-09 DIAGNOSIS — D62 Acute posthemorrhagic anemia: Secondary | ICD-10-CM | POA: Diagnosis present

## 2018-11-09 DIAGNOSIS — K921 Melena: Secondary | ICD-10-CM

## 2018-11-09 LAB — GLUCOSE, CAPILLARY
Glucose-Capillary: 146 mg/dL — ABNORMAL HIGH (ref 70–99)
Glucose-Capillary: 142 mg/dL — ABNORMAL HIGH (ref 70–99)
Glucose-Capillary: 129 mg/dL — ABNORMAL HIGH (ref 70–99)
Glucose-Capillary: 106 mg/dL — ABNORMAL HIGH (ref 70–99)
Glucose-Capillary: 97 mg/dL (ref 70–99)

## 2018-11-09 LAB — BASIC METABOLIC PANEL
Anion gap: 9 (ref 5–15)
BUN: 67 mg/dL — ABNORMAL HIGH (ref 6–20)
CO2: 21 mmol/L — ABNORMAL LOW (ref 22–32)
Calcium: 8.1 mg/dL — ABNORMAL LOW (ref 8.9–10.3)
Chloride: 108 mmol/L (ref 98–111)
Creatinine, Ser: 1.73 mg/dL — ABNORMAL HIGH (ref 0.61–1.24)
GFR calc Af Amer: 49 mL/min — ABNORMAL LOW (ref >60)
GFR calc non Af Amer: 43 mL/min — ABNORMAL LOW (ref >60)
Glucose, Bld: 141 mg/dL — ABNORMAL HIGH (ref 70–99)
Potassium: 4 mmol/L (ref 3.5–5.1)
Sodium: 138 mmol/L (ref 135–145)

## 2018-11-09 LAB — CBC
HCT: 23.2 % — ABNORMAL LOW (ref 39.0–52.0)
Hemoglobin: 7.5 g/dL — ABNORMAL LOW (ref 13.0–17.0)
MCH: 30.7 pg (ref 26.0–34.0)
MCHC: 32.3 g/dL (ref 30.0–36.0)
MCV: 95.1 fL (ref 80.0–100.0)
Platelets: 388 10*3/uL (ref 150–400)
RBC: 2.44 MIL/uL — ABNORMAL LOW (ref 4.22–5.81)
RDW: 17.6 % — ABNORMAL HIGH (ref 11.5–15.5)
WBC: 21 10*3/uL — ABNORMAL HIGH (ref 4.0–10.5)
nRBC: 3 % — ABNORMAL HIGH (ref 0.0–0.2)

## 2018-11-09 LAB — PHOSPHORUS: Phosphorus: 5 mg/dL — ABNORMAL HIGH (ref 2.5–4.6)

## 2018-11-09 LAB — FIBRINOGEN: Fibrinogen: 333 mg/dL (ref 210–475)

## 2018-11-09 LAB — MRSA PCR SCREENING: MRSA by PCR: NEGATIVE

## 2018-11-09 LAB — PROTIME-INR
INR: 1.5 — ABNORMAL HIGH (ref 0.8–1.2)
Prothrombin Time: 18.2 seconds — ABNORMAL HIGH (ref 11.4–15.2)

## 2018-11-09 LAB — MAGNESIUM: Magnesium: 2.4 mg/dL (ref 1.7–2.4)

## 2018-11-09 LAB — PREPARE RBC (CROSSMATCH)

## 2018-11-09 LAB — HEMOGLOBIN AND HEMATOCRIT, BLOOD
HCT: 21.9 % — ABNORMAL LOW (ref 39.0–52.0)
Hemoglobin: 7.2 g/dL — ABNORMAL LOW (ref 13.0–17.0)

## 2018-11-09 LAB — LACTIC ACID, PLASMA: Lactic Acid, Venous: 1.8 mmol/L (ref 0.5–1.9)

## 2018-11-09 MED ORDER — ONDANSETRON HCL 4 MG/2ML IJ SOLN: 4.0000 mg | Freq: Four times a day (QID) | INTRAMUSCULAR | Status: DC | PRN

## 2018-11-09 MED ORDER — AMIODARONE HCL 200 MG PO TABS
200.0000 mg | ORAL_TABLET | Freq: Every day | ORAL | Status: DC
  Administered 2018-11-09 – 2018-11-11 (×3): 200 mg via ORAL
  Filled 2018-11-09 (×3): qty 1

## 2018-11-09 MED ORDER — ONDANSETRON HCL 4 MG PO TABS: 4.0000 mg | ORAL_TABLET | Freq: Four times a day (QID) | ORAL | Status: DC | PRN

## 2018-11-09 MED ORDER — EZETIMIBE 10 MG PO TABS
10.0000 mg | ORAL_TABLET | Freq: Every day | ORAL | Status: DC
  Administered 2018-11-09 – 2018-11-11 (×3): 10 mg via ORAL
  Filled 2018-11-09 (×3): qty 1

## 2018-11-09 MED ORDER — SODIUM CHLORIDE 0.9% IV SOLUTION: Freq: Once | INTRAVENOUS | Status: DC

## 2018-11-09 MED ORDER — INSULIN ASPART 100 UNIT/ML ~~LOC~~ SOLN
0.0000 [IU] | Freq: Three times a day (TID) | SUBCUTANEOUS | Status: DC
  Administered 2018-11-11: 2 [IU] via SUBCUTANEOUS

## 2018-11-09 MED ORDER — ROSUVASTATIN CALCIUM 20 MG PO TABS
20.0000 mg | ORAL_TABLET | Freq: Every day | ORAL | Status: DC
  Administered 2018-11-09 – 2018-11-11 (×3): 20 mg via ORAL
  Filled 2018-11-09 (×3): qty 1

## 2018-11-09 NOTE — Telephone Encounter (Signed)
Spoke to the patient, the patient informed this RN that he was currently in the hospital and wanted Dr. Carlean Purl to be aware.

## 2018-11-09 NOTE — H&P (View-Only) (Signed)
[facility] Consult: 8:23 AM 11/09/2018  LOS: 1 day    Referring Provider: Dr Cordelia Poche  Primary Care Physician: Does not have one.  He tends to see specialist Primary Gastroenterologist:  Dr Silvano Rusk    Reason for Consultation:  Melena, anemia with Hgb 4.7.     HPI: Oscar [name] is a 58 y.o. male.  Hx hypertension.  Hyperlipidemia.  OSA on CPAP.  COPD.  Glucose intolerance.  CKD?.  2014 laparoscopic ventral hernia repair, his fourth.  Developed ventral hernia after emergency appendectomy ~ 2002.    05/2016 colonoscopy: For evaluation of hematochezia.  Severe sigmoid diverticulosis active bleeding coming from a diverticulum.  Endoclips placed.  Small sigmoid colon polyp removed, polypectomy site clipped.  Nonbleeding internal hemorrhoids.  Hepatitis A at 58 years old. Pathology polypoid mucosa with erosion and granulation consistent with inflammatory polyp.  Recall colonoscopy due 05/2026  10/21/2018 -10/27/2018 admission with acute STEMI,, spontaneous resolution of ST elevation.  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  EF > 50%.  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 developed A. fib, treated with amiodarone and Eliquis, converted to NSR.  Discharged on Lasix for volume overload and Eliquis.  4 days of progressive weakness, DOE, softer black stools.  No chest or abdominal pain, nausea, vomiting, heartburn, anorexia, NSAIDs.  After his bypass he continued on his usual walking regimen of 2 miles a day.  Last Thursday he noticed that it was more difficult to do this.  By Sunday, yesterday, he was significantly dyspneic fatigued and presyncopal with just walking from the bed to the bathroom and would have to stop even during that short distance. Came to the ED.  Mild hypotension, elevated lactate improved with fluids.  FOBT positive.   Hgb late July trending lower from 13.6 on 7/23 to 9.6 on 7/27.  4.7 late last night.  MCV 101. Received 3 PRBCs, recheck Hgb pending for 1045 today. WBCs 25.9 Platelets currently 518.  Were as low as 129 on 7/24. INR 1.4 BUN/creatinine 74/2.0, was 42/1.8 on 7/27. Lactate elevated.  Troponin 47 (normal <18) COVID-19 negative  Last Eliquis was taken yesterday morning, 11/08/2018.   Protonix drip in place.  Patient is very proactive as far as his health goes.  He has a Wellsite geologist at [facility] and knew that he had CAD but his heart was well perfused on previous cardiac studies.  He lifts weights and does aerobic exercise regularly, rode his bike 20 miles on the Saturday just before he experienced his MI.   Weight is stable around 250 pounds.   Drinks 1 or 2 glasses of wine per week on average.  Does not smoke. 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  No family history of ulcer disease or GI bleeding or colon cancer.  Significant history of vascular disease in parents, siblings with strokes, early CAD, MI.  Father died as a result of complications following gastric bypass     Past Medical History:  Diagnosis Date  . Adenomatous polyp   . Chickenpox   . 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  Diverticulitis   . Hepatitis A    as a child, no current liver problems  . Hyperlipidemia   . Hypertension    he also sees Dr. Lynita Lombard in [place], [place]   . Lower GI bleed    Hx: of  . Myocardial infarction (JAARS)   . Nephrolithiasis    2-3 kidney stones in past  . Obesity   . Pneumonia   . Prediabetes   . Sleep apnea 2003   CPAP machine, pt does not know settings    Past Surgical History:  Procedure Laterality Date  . APPENDECTOMY   10-76yrs ago  . CLOSED REDUCTION FINGER WITH PERCUTANEOUS PINNING Left 04/22/2013   Procedure: LEFT WRIST CLOSED REDUCTION CONVERTED TO OPEN REDUCTION INTERNAL FIXATION, LEFT CARPAL TUNNEL RELEASE;  Surgeon: Linna Hoff, MD;  Location: [place];   Service: Orthopedics;  Laterality: Left;  . COLONOSCOPY  07-09-09   benign polyp repeat 5 years, Dr.Sam Penelope Coop  . CORONARY ARTERY BYPASS GRAFT N/A 10/22/2018   Procedure: CORONARY ARTERY BYPASS GRAFTING (CABG) times four using right and left internal mammary arteries, left radial artery, and right greater saphenous vein harvested endoscopically.;  Surgeon: Wonda Olds, MD;  Location: [place];  Service: Open Heart Surgery;  Laterality: N/A;  . CORONARY ARTERY BYPASS GRAFT    . CORONARY/GRAFT ACUTE MI REVASCULARIZATION N/A 10/21/2018   Procedure: Coronary/Graft Acute MI Revascularization;  Surgeon: Belva Crome, MD;  Location: [place] [facility];  Service: Cardiovascular;  Laterality: N/A;  . HERNIA REPAIR     repaired x 4  . INSERTION OF MESH  04/09/2012   Procedure: INSERTION OF MESH;  Surgeon: Earnstine Regal, MD;  Location: [facility];  Service: General;  Laterality: N/A;  . LEFT HEART CATH AND CORONARY ANGIOGRAPHY N/A 10/21/2018   Procedure: LEFT HEART CATH AND CORONARY ANGIOGRAPHY;  Surgeon: Belva Crome, MD;  Location: [place] [facility];  Service: Cardiovascular;  Laterality: N/A;  . RADIAL ARTERY HARVEST Left 10/22/2018   Procedure: LEFT RADIAL ARTERY HARVEST;  Surgeon: Wonda Olds, MD;  Location: [place];  Service: Open Heart Surgery;  Laterality: Left;  . TEE WITHOUT CARDIOVERSION N/A 10/22/2018   Procedure: TRANSESOPHAGEAL ECHOCARDIOGRAM (TEE);  Surgeon: Wonda Olds, MD;  Location: [place];  Service: Open Heart Surgery;  Laterality: N/A;  . TONSILLECTOMY  as child  . UVULECTOMY  yrs ago   for snoring  . VENTRAL HERNIA REPAIR  04/09/2012   Procedure: LAPAROSCOPIC VENTRAL HERNIA;  Surgeon: Earnstine Regal, MD;  Location: [facility];  Service: General;  Laterality: N/A;  Laparoscopic Ventral Incisional Hernia Repair with Mesh    Prior to Admission medications   Medication Sig Start Date End Date Taking? Authorizing Provider  acetaminophen (TYLENOL) 500 MG tablet Take 2 tablets (1,000 mg  total) by mouth every 6 (six) hours as needed. 10/27/18   Barrett, Erin R, PA-C  acidophilus (RISAQUAD) CAPS capsule Take 1 capsule by mouth 2 (two) times daily.    [provider]  ALPRAZolam Duanne Moron) 1 MG tablet Take 1 mg by mouth at bedtime as needed for sleep. 09/23/18   [provider]  amiodarone (PACERONE) 200 MG tablet Take 2 tablets (400 mg total) by mouth 2 (two) times daily. X 7 days, then decrease to 200 mg BID x 7 days, then 200 mg daily 10/27/18   Barrett, Erin R, PA-C  Anastrozole (ARIMIDEX PO) Take 0.25 mg by mouth See admin instructions. MON and THUR    [provider]  apixaban (ELIQUIS) 5 MG TABS tablet Take 1 tablet (5 mg total) by mouth 2 (two) times daily. 10/27/18   Barrett, Lodema Hong, PA-C  aspirin EC 81 MG tablet Take 1 tablet (81 mg total) by mouth daily. 10/27/18   Barrett, Erin R, PA-C  colchicine 0.6 MG tablet Take 1 tablet (0.6 mg total) by mouth daily. 10/27/18   Barrett, Erin R, PA-C  DHEA 50 MG CAPS Take 50 mg by mouth daily.    [provider]  ezetimibe (ZETIA) 10 MG tablet Take 10 mg by mouth daily.    [provider]  furosemide (LASIX) 40 MG tablet Take 1 tablet (40 mg total) by mouth daily. 10/27/18 10/27/19  Barrett, Erin R, PA-C  isosorbide dinitrate (ISORDIL) 10 MG tablet Take 1 tablet (10 mg total) by mouth 3 (three) times daily. 10/27/18   Barrett, Erin R, PA-C  metFORMIN (GLUCOPHAGE) 500 MG tablet Take 1,000 mg by mouth 2 (two) times daily. Pt takes one tablet in the morning and two at night.    [provider]  metoprolol tartrate (LOPRESSOR) 25 MG tablet Take 1 tablet (25 mg total) by mouth 2 (two) times daily. 10/27/18   Barrett, Erin R, PA-C  oxyCODONE 10 MG TABS Take 0.5-1 tablets (5-10 mg total) by mouth every 4 (four) hours as needed for severe pain. 10/27/18   Barrett, Erin R, PA-C  potassium chloride 20 MEQ TBCR Take 20 mEq by mouth daily. 10/27/18   Barrett, Erin R, PA-C  rosuvastatin (CRESTOR) 40 MG tablet  Take 1 tablet (40 mg total) by mouth daily. Patient taking differently: Take 20 mg by mouth daily.  10/24/15   Lelon Perla, MD  Turmeric (QC TUMERIC COMPLEX PO) Take 1 capsule by mouth daily.    [provider]  vitamin C (ASCORBIC ACID) 500 MG tablet Take 1,000 mg by mouth 2 (two) times daily.    [provider]    Scheduled Meds: . [START ON 11/12/2018] pantoprazole  40 mg Intravenous Q12H   Infusions: . pantoprozole (PROTONIX) infusion 8 mg/hr (11/08/18 2312)   PRN Meds: ondansetron **OR** ondansetron (ZOFRAN) IV   Allergies as of 11/08/2018 - Review Complete 11/08/2018  Allergen Reaction Noted  . Acetaminophen Other (See Comments) 10/25/2014  . Ibuprofen Other (See Comments) 10/25/2014  . Advair diskus [fluticasone-salmeterol] Cough 11/01/2015  . Latex Itching 09/23/2017    Family History  Problem Relation Age of Onset  . Stroke Mother        late 50s  . CAD Mother        MI in her 47s  . CAD Father 31  . Marfan syndrome Brother   . Diabetes Brother   . Hypertension Brother   . Obesity Brother   . Colon cancer Neg Hx     Social History   Socioeconomic History  . Marital status: Married    Spouse name: Baxter Flattery  . Number of children: 4  . Years of education: 22  . Highest education level: Not on file  Occupational History  . Occupation: Self employed: Owns Programmer, multimedia: [employer]  Social Needs  . Financial resource strain: Not on file  . Food insecurity    Worry: Not on file    Inability: Not on file  . Transportation needs    Medical: Not on file    Non-medical: Not on file  Tobacco Use  . Smoking status: Never Smoker  . Smokeless tobacco:  Never Used  Substance and Sexual Activity  . Alcohol use: Yes    Alcohol/week: 0.0 standard drinks    Comment: 2-3 glasses 2-3 times a week-wine; occasional liquior drinks  . Drug use: No  . Sexual activity: Never  Lifestyle  . Physical activity    Days  per week: Not on file    Minutes per session: Not on file  . Stress: Not on file  Relationships  . Social Herbalist on phone: Not on file    Gets together: Not on file    Attends religious service: Not on file    Active member of club or organization: Not on file    Attends meetings of clubs or organizations: Not on file    Relationship status: Not on file  . Intimate partner violence    Fear of current or ex partner: Not on file    Emotionally abused: Not on file    Physically abused: Not on file    Forced sexual activity: Not on file  Other Topics Concern  . Not on file  Social History Narrative   Married.  Lives with wife.  Independent of ADLs.   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 ENT:  No nose bleeds Pulm: No cough.  See HPI. CV:  No palpitations, no LE edema.  GU:  No hematuria, no frequency GI: See HPI.  No history liver problems or elevated LFTs Heme: Has had some bruising on his arms where they harvested vein and drew blood.. Transfusions: Prior transfusion.  Just completed his third unit. Neuro:  No headaches, no peripheral tingling or numbness Derm:  No itching, no rash or sores.  Endocrine:  No sweats or chills.  No polyuria or dysuria Immunization: Not queried. Travel:  None beyond local counties in last few months.    PHYSICAL EXAM: Vital signs in last 24 hours: Vitals:   11/09/18 0615 11/09/18 0804  BP: 106/72 108/64  Pulse: 91 87  Resp: (!) 21 (!) 24  Temp:  98.6 F (37 C)  SpO2: 99% 100%   Wt Readings from Last 3 Encounters:  11/09/18 111.6 kg  10/30/18 119.7 kg  10/27/18 121.3 kg    General: Well-appearing, overweight, comfortable Head: No facial asymmetry or swelling.  No signs of head trauma. Eyes: Conjunctiva slightly pale Ears: Not hard of hearing Nose: No congestion or discharge. Mouth: Oropharynx moist, pink, clear.  Good dentition.  Tongue midline. Neck: No JVD, no masses, no  thyromegaly. Lungs: Fine rales in the left base.  No cough.  No dyspnea. Heart: RRR.  No MRG.  Neurotomy scar healing nicely and is CDI. Abdomen: Soft, not tender.  No distention.  Rectal: Deferred. Musc/Skeltl: No joint swelling or redness.  No gross joint deformities. Extremities: No CCE E in the lower extremities.  Some swelling in the right forearm in the region of the harvest incision.  That incision is intact, clean, dry, only nicely. Neurologic: Somewhat pressured speech.  Some trouble remembering simple things such as whether or not he took his p.m. doses of meds last night, when he started seeing the dark stools.  However he is alert and oriented x3.  No tremors.  No gross weakness or deficits. Skin: Lots of freckles all over.  No suspicious lesions.  No sores. Tattoos: Multiple on arms, trunk, right leg. Nodes: No cervical adenopathy Psych: Cooperative, pleasant, pressured speech gives the impression of some anxiety.  Intake/Output from previous day:  08/09 0701 - 08/10 0700 In: 1337.7 [I.V.:128.7; Blood:1209] Out: 1525 [Urine:1525] Intake/Output this shift: Total I/O In: -  Out: 400 [Urine:400]  LAB RESULTS: Recent Labs    11/08/18 2022  WBC 25.9*  HGB 4.7*  HCT 15.9*  PLT 518*   BMET Lab Results  Component Value Date   NA 132 (L) 11/08/2018   NA 136 10/27/2018   NA 137 10/26/2018   K 4.5 11/08/2018   K 3.5 10/27/2018   K 3.7 10/26/2018   CL 100 11/08/2018   CL 101 10/27/2018   CL 99 10/26/2018   CO2 16 (L) 11/08/2018   CO2 27 10/27/2018   CO2 27 10/26/2018   GLUCOSE 152 (H) 11/08/2018   GLUCOSE 110 (H) 10/27/2018   GLUCOSE 118 (H) 10/26/2018   BUN 74 (H) 11/08/2018   BUN 35 (H) 10/27/2018   BUN 42 (H) 10/26/2018   CREATININE 2.04 (H) 11/08/2018   CREATININE 1.47 (H) 10/27/2018   CREATININE 1.81 (H) 10/26/2018   CALCIUM 8.4 (L) 11/08/2018   CALCIUM 7.5 (L) 10/27/2018   CALCIUM 7.6 (L) 10/26/2018   LFT Recent Labs    11/08/18 2022  PROT 5.4*   ALBUMIN 2.5*  AST 23  ALT 20  ALKPHOS 49  BILITOT 0.3   PT/INR Lab Results  Component Value Date   INR 1.4 (H) 10/22/2018   INR 1.1 10/21/2018   INR 1.02 12/26/2012   C-Diff No components found for: CDIFF Lipase     Component Value Date/Time   LIPASE 19 04/17/2012 1120    Drugs of Abuse  No results found for: LABOPIA, COCAINSCRNUR, LABBENZ, AMPHETMU, THCU, LABBARB   RADIOLOGY STUDIES: Dg Chest Port 1 View  Result Date: 11/08/2018 CLINICAL DATA:  58 year old male with shortness of breath and weakness. EXAM: PORTABLE CHEST 1 VIEW COMPARISON:  Chest radiograph dated 10/30/2018 FINDINGS: Trace pleural effusions may be present. No focal consolidation or pneumothorax. Stable mild cardiomegaly. Median sternotomy wires and CABG vascular clips. No acute osseous pathology. Old healed left clavicular fracture. IMPRESSION: No focal consolidation.  No interval change. Electronically Signed   By: Anner Crete M.D.   On: 11/08/2018 20:18     IMPRESSION:   *    GI bleed in setting of Eliquis.  Tarry stools and elevated BUN suggest upper GI source.  Protonix drip in place  *     Blood loss anemia.  3 PRBCs thus far.  MCV slightly elevated.  *     Multivessel CAD.  4 V CABG 7/23 after spontaneous resolution of STEMI.    *   Post op a fib 09/2018.  Discharged on Eliquis, Amiodarone.   INR 1.4.   Last dose of Eliquis was 11/08/2018 in the a.m. (wife confirmed)  *   CKD vs recurrent AKI  *    Diverticular bleeding 05/2016.     RECOMMENDATIONS:     *   EGD, timing today vs tmrw   Azucena Freed  11/09/2018, 8:23 AM Phone [phone]     Attending Physician Note   I have taken a history, examined the patient and reviewed the chart. I agree with the Advanced Practitioner's note, impression and recommendations.   Assessment: GI bleed, strongly suspect UGI source given melena and elevated BUN. R/O ulcer, gastritis, etc   ABL anemia S/P STEMI on 7/22 S/P 4 V CABG on 7/23 Post  op afib on Eliquis  CKD with AKI History of diverticular bleed in 2018  Recommendations: Hold Eliquis IV pantoprazole  infusion Transfuse to maintain Hb > 7 Trend CBC EGD tomorrow with Dr. Bryan Lemma, allow Eliqius wash out   Lucio Edward, MD [facility]

## 2018-11-09 NOTE — Progress Notes (Signed)
Patient here via stretcher. Patient alert and verbal. Some labored breathing noted. On 02 at 2L via nasal cannula. Assistedx 3 over to bed. Shortness of breath noted on rest and exertion. Skin pale in color. Currently have protonix infusing. Has 2 IVs 20 gauge in right AC. 20 gauge in right upper arm. Patient states that he was on eliquis at home. He also stated that he has been having black stools for about a week now. LBM 11/08/18 and it was black. Patient also states that he have had GI bleeds in the past. Lung sounds clear. Vitals stable. Afebrile. No c/o pain at this time.   0222-2nd unit of blood infusing at present. Will monitor directly for 15 mins. Educated patient on signs and symptoms to report. Patient wearing home cpap at this time. Restricted extremetie placed on left arm d/t previous graft site.

## 2018-11-09 NOTE — Telephone Encounter (Signed)
Pt's wife called to inform that pt is at [facility] hospital for GI bleed. She would like to speak with Dr. Carlean Purl or his nurse. Pls call her.

## 2018-11-09 NOTE — H&P (Signed)
History and Physical    Oscar [name] NID:[id number] DOB: January 18, 1961 DOA: 11/08/2018  PCP: System, Pcp Not In  Patient coming from: Home.  Chief Complaint: Weakness and black stools.  HPI: Oscar [name] is a 58 y.o. male with history of hypertension, chronic kidney disease who recently underwent CABG 2 weeks ago has been experiencing increasing weakness over the last 1 week.  Has noticed increasing black stools.  Patient has had postoperative A. fib and was placed on Eliquis and amiodarone.  Due to the symptoms patient comes to the ER.  Denies any chest pain shortness of breath abdominal pain nausea or vomiting.  Denies taking any NSAIDs.  Patient has had previous history of lower GI bleed followed by Dr. Carlean Purl.  ED Course: In the ER patient was stool for occult blood positive melanotic with hemoglobin dropping from 9 2 weeks ago to 4.  Patient was mildly hypotensive with elevated lactate which improved with fluids.  Blood pressure also improved with fluids.  Initially PCCM was consulted but since patient blood pressure improved and patient has good mentation admitted to hospital service asked to progressive care.  On-call gastroenterology Dr. Havery Moros was consulted patient started on Protonix infusion and 2 units of PRBC transfusion.  Review of Systems: As per HPI, rest all negative.   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 Lynita Lombard in [place], [place]    Lower GI bleed    Hx: of   Myocardial infarction [facility])    Nephrolithiasis    2-3 kidney stones in past   Obesity    Pneumonia    Prediabetes    Sleep apnea 2003   CPAP machine, pt does not know settings    Past Surgical History:  Procedure Laterality Date   APPENDECTOMY   10-86yrs ago   CLOSED  REDUCTION FINGER WITH PERCUTANEOUS PINNING Left 04/22/2013   Procedure: LEFT WRIST CLOSED REDUCTION CONVERTED TO OPEN REDUCTION INTERNAL FIXATION, LEFT CARPAL TUNNEL RELEASE;  Surgeon: Linna Hoff, MD;  Location: [place];  Service: Orthopedics;  Laterality: Left;   COLONOSCOPY  07-09-09   benign polyp repeat 5 years, Dr.Sam Ganem   CORONARY ARTERY BYPASS GRAFT N/A 10/22/2018   Procedure: CORONARY ARTERY BYPASS GRAFTING (CABG) times four using right and left internal mammary arteries, left radial artery, and right greater saphenous vein harvested endoscopically.;  Surgeon: Wonda Olds, MD;  Location: [place];  Service: Open Heart Surgery;  Laterality: N/A;   CORONARY ARTERY BYPASS GRAFT     CORONARY/GRAFT ACUTE MI REVASCULARIZATION N/A 10/21/2018   Procedure: Coronary/Graft Acute MI Revascularization;  Surgeon: Belva Crome, MD;  Location: [place] [facility];  Service: Cardiovascular;  Laterality: N/A;   HERNIA REPAIR     repaired x 4   INSERTION OF MESH  04/09/2012   Procedure: INSERTION OF MESH;  Surgeon: Earnstine Regal, MD;  Location: [facility];  Service: General;  Laterality: N/A;   LEFT HEART CATH AND CORONARY ANGIOGRAPHY N/A 10/21/2018   Procedure: LEFT HEART CATH AND CORONARY ANGIOGRAPHY;  Surgeon: Belva Crome, MD;  Location: [place] [facility];  Service: Cardiovascular;  Laterality: N/A;   RADIAL ARTERY HARVEST Left 10/22/2018   Procedure: LEFT RADIAL ARTERY HARVEST;  Surgeon: Fredrich Romans  Z, MD;  Location: [place];  Service: Open Heart Surgery;  Laterality: Left;   TEE WITHOUT CARDIOVERSION N/A 10/22/2018   Procedure: TRANSESOPHAGEAL ECHOCARDIOGRAM (TEE);  Surgeon: Wonda Olds, MD;  Location: [place];  Service: Open Heart Surgery;  Laterality: N/A;   TONSILLECTOMY  as child   UVULECTOMY  yrs ago   for snoring   VENTRAL HERNIA REPAIR  04/09/2012   Procedure: LAPAROSCOPIC VENTRAL HERNIA;  Surgeon: Earnstine Regal, MD;  Location: [facility];  Service: General;  Laterality: N/A;   Laparoscopic Ventral Incisional Hernia Repair with Mesh     reports that he has never smoked. He has never used smokeless tobacco. He reports current alcohol use. He reports that he does not use drugs.  Allergies  Allergen Reactions   Acetaminophen Other (See Comments)    Bleeding issues   Ibuprofen Other (See Comments)    Bleeding issues   Advair Diskus [Fluticasone-Salmeterol] Cough   Latex Itching    Family History  Problem Relation Age of Onset   Stroke Mother        late 80s   CAD Mother        MI in her 20s   CAD Father 104   Marfan syndrome Brother    Diabetes Brother    Hypertension Brother    Obesity Brother    Colon cancer Neg Hx     Prior to Admission medications   Medication Sig Start Date End Date Taking? Authorizing Provider  acetaminophen (TYLENOL) 500 MG tablet Take 2 tablets (1,000 mg total) by mouth every 6 (six) hours as needed. 10/27/18   Barrett, Erin R, PA-C  acidophilus (RISAQUAD) CAPS capsule Take 1 capsule by mouth 2 (two) times daily.    [provider]  ALPRAZolam Duanne Moron) 1 MG tablet Take 1 mg by mouth at bedtime as needed for sleep. 09/23/18   [provider]  amiodarone (PACERONE) 200 MG tablet Take 2 tablets (400 mg total) by mouth 2 (two) times daily. X 7 days, then decrease to 200 mg BID x 7 days, then 200 mg daily 10/27/18   Barrett, Erin R, PA-C  Anastrozole (ARIMIDEX PO) Take 0.25 mg by mouth See admin instructions. MON and THUR    [provider]  apixaban (ELIQUIS) 5 MG TABS tablet Take 1 tablet (5 mg total) by mouth 2 (two) times daily. 10/27/18   Barrett, Lodema Hong, PA-C  aspirin EC 81 MG tablet Take 1 tablet (81 mg total) by mouth daily. 10/27/18   Barrett, Erin R, PA-C  colchicine 0.6 MG tablet Take 1 tablet (0.6 mg total) by mouth daily. 10/27/18   Barrett, Erin R, PA-C  DHEA 50 MG CAPS Take 50 mg by mouth daily.    [provider]  ezetimibe (ZETIA) 10 MG tablet Take 10 mg by mouth daily.     [provider]  furosemide (LASIX) 40 MG tablet Take 1 tablet (40 mg total) by mouth daily. 10/27/18 10/27/19  Barrett, Erin R, PA-C  isosorbide dinitrate (ISORDIL) 10 MG tablet Take 1 tablet (10 mg total) by mouth 3 (three) times daily. 10/27/18   Barrett, Erin R, PA-C  metFORMIN (GLUCOPHAGE) 500 MG tablet Take 1,000 mg by mouth 2 (two) times daily. Pt takes one tablet in the morning and two at night.    [provider]  metoprolol tartrate (LOPRESSOR) 25 MG tablet Take 1 tablet (25 mg total) by mouth 2 (two) times daily. 10/27/18   Barrett, Lodema Hong, PA-C  oxyCODONE  10 MG TABS Take 0.5-1 tablets (5-10 mg total) by mouth every 4 (four) hours as needed for severe pain. 10/27/18   Barrett, Erin R, PA-C  potassium chloride 20 MEQ TBCR Take 20 mEq by mouth daily. 10/27/18   Barrett, Erin R, PA-C  rosuvastatin (CRESTOR) 40 MG tablet Take 1 tablet (40 mg total) by mouth daily. Patient taking differently: Take 20 mg by mouth daily.  10/24/15   Lelon Perla, MD  Turmeric (QC TUMERIC COMPLEX PO) Take 1 capsule by mouth daily.    [provider]  vitamin C (ASCORBIC ACID) 500 MG tablet Take 1,000 mg by mouth 2 (two) times daily.    [provider]    Physical Exam: Constitutional: Moderately built and nourished. Vitals:   11/08/18 2245 11/08/18 2317 11/08/18 2330 11/09/18 0000  BP: 116/62 (!) 121/58 (!) 134/43 131/72  Pulse: (!) 102 (!) 102 (!) 102 (!) 101  Resp: (!) 30 (!) 28 (!) 25 (!) 25  Temp:      TempSrc:      SpO2: 97% 98% 91% 100%   Eyes: Anicteric no pallor. ENMT: No discharge from the ears eyes nose or mouth. Neck: No mass felt.  No neck rigidity. Respiratory: No rhonchi or crepitations. Cardiovascular: S1-S2 heard. Abdomen: Soft nontender bowel sounds present. Musculoskeletal: No edema. Skin: No rash. Neurologic: Alert awake oriented to time place and person.  Moves all extremities. Psychiatric: Appears normal per normal affect.   Labs on  Admission: I have personally reviewed following labs and imaging studies  CBC: Recent Labs  Lab 11/08/18 2022  WBC 25.9*  NEUTROABS 22.3*  HGB 4.7*  HCT 15.9*  MCV 101.3*  PLT 638*   Basic Metabolic Panel: Recent Labs  Lab 11/08/18 2022  NA 132*  K 4.5  CL 100  CO2 16*  GLUCOSE 152*  BUN 74*  CREATININE 2.04*  CALCIUM 8.4*   GFR: Estimated Creatinine Clearance: 54.3 mL/min (A) (by C-G formula based on SCr of 2.04 mg/dL (H)). Liver Function Tests: Recent Labs  Lab 11/08/18 2022  AST 23  ALT 20  ALKPHOS 49  BILITOT 0.3  PROT 5.4*  ALBUMIN 2.5*   No results for input(s): LIPASE, AMYLASE in the last 168 hours. No results for input(s): AMMONIA in the last 168 hours. Coagulation Profile: No results for input(s): INR, PROTIME in the last 168 hours. Cardiac Enzymes: No results for input(s): CKTOTAL, CKMB, CKMBINDEX, TROPONINI in the last 168 hours. BNP (last 3 results) No results for input(s): PROBNP in the last 8760 hours. HbA1C: No results for input(s): HGBA1C in the last 72 hours. CBG: No results for input(s): GLUCAP in the last 168 hours. Lipid Profile: No results for input(s): CHOL, HDL, LDLCALC, TRIG, CHOLHDL, LDLDIRECT in the last 72 hours. Thyroid Function Tests: No results for input(s): TSH, T4TOTAL, FREET4, T3FREE, THYROIDAB in the last 72 hours. Anemia Panel: No results for input(s): VITAMINB12, FOLATE, FERRITIN, TIBC, IRON, RETICCTPCT in the last 72 hours. Urine analysis:    Component Value Date/Time   COLORURINE YELLOW 10/21/2018 1928   APPEARANCEUR CLEAR 10/21/2018 1928   LABSPEC 1.009 10/21/2018 1928   PHURINE 6.0 10/21/2018 1928   GLUCOSEU NEGATIVE 10/21/2018 1928   HGBUR SMALL (A) 10/21/2018 1928   BILIRUBINUR NEGATIVE 10/21/2018 1928   BILIRUBINUR n 06/18/2011 [place] 10/21/2018 1928   PROTEINUR NEGATIVE 10/21/2018 1928   UROBILINOGEN 0.2 06/18/2011 0907   NITRITE NEGATIVE 10/21/2018 1928   LEUKOCYTESUR NEGATIVE  10/21/2018 1928   Sepsis  Labs: @LABRCNTIP (procalcitonin:4,lacticidven:4) ) Recent Results (from the past 240 hour(s))  SARS Coronavirus 2 [facility] order, Performed in [facility] hospital lab) Nasopharyngeal Nasopharyngeal Swab     Status: None   Collection Time: 11/08/18  9:26 PM   Specimen: Nasopharyngeal Swab  Result Value Ref Range Status   SARS Coronavirus 2 NEGATIVE NEGATIVE Final    Comment: (NOTE) If result is NEGATIVE SARS-CoV-2 target nucleic acids are NOT DETECTED. The SARS-CoV-2 RNA is generally detectable in upper and lower  respiratory specimens during the acute phase of infection. The lowest  concentration of SARS-CoV-2 viral copies this assay can detect is 250  copies / mL. A negative result does not preclude SARS-CoV-2 infection  and should not be used as the sole basis for treatment or other  patient management decisions.  A negative result may occur with  improper specimen collection / handling, submission of specimen other  than nasopharyngeal swab, presence of viral mutation(s) within the  areas targeted by this assay, and inadequate number of viral copies  (<250 copies / mL). A negative result must be combined with clinical  observations, patient history, and epidemiological information. If result is POSITIVE SARS-CoV-2 target nucleic acids are DETECTED. The SARS-CoV-2 RNA is generally detectable in upper and lower  respiratory specimens dur ing the acute phase of infection.  Positive  results are indicative of active infection with SARS-CoV-2.  Clinical  correlation with patient history and other diagnostic information is  necessary to determine patient infection status.  Positive results do  not rule out bacterial infection or co-infection with other viruses. If result is PRESUMPTIVE POSTIVE SARS-CoV-2 nucleic acids MAY BE PRESENT.   A presumptive positive result was obtained on the submitted specimen  and confirmed on repeat testing.  While 2019 novel  coronavirus  (SARS-CoV-2) nucleic acids may be present in the submitted sample  additional confirmatory testing may be necessary for epidemiological  and / or clinical management purposes  to differentiate between  SARS-CoV-2 and other Sarbecovirus currently known to infect humans.  If clinically indicated additional testing with an alternate test  methodology [phone]) is advised. The SARS-CoV-2 RNA is generally  detectable in upper and lower respiratory sp ecimens during the acute  phase of infection. The expected result is Negative. Fact Sheet for Patients:  StrictlyIdeas.no Fact Sheet for Healthcare Providers: BankingDealers.co.za This test is not yet approved or cleared by the [place] FDA and has been authorized for detection and/or diagnosis of SARS-CoV-2 by FDA under an Emergency Use Authorization (EUA).  This EUA will remain in effect (meaning this test can be used) for the duration of the COVID-19 declaration under Section 564(b)(1) of the Act, 21 U.S.C. section 360bbb-3(b)(1), unless the authorization is terminated or revoked sooner. Performed at [facility], [place] [address], [place], [place] [address]      Radiological Exams on Admission: Dg Chest Port 1 View  Result Date: 11/08/2018 CLINICAL DATA:  58 year old male with shortness of breath and weakness. EXAM: PORTABLE CHEST 1 VIEW COMPARISON:  Chest radiograph dated 10/30/2018 FINDINGS: Trace pleural effusions may be present. No focal consolidation or pneumothorax. Stable mild cardiomegaly. Median sternotomy wires and CABG vascular clips. No acute osseous pathology. Old healed left clavicular fracture. IMPRESSION: No focal consolidation.  No interval change. Electronically Signed   By: Anner Crete M.D.   On: 11/08/2018 20:18    EKG: Independently reviewed.  Sinus rhythm with nonspecific ST-T changes.  Assessment/Plan Principal Problem:   Acute GI  bleeding Active Problems:  Essential hypertension   Asthma   Obesity (BMI 30-39.9)   CAD, multiple vessel   S/P CABG x 4   Acute blood loss anemia    1. Acute GI bleeding in the setting of taking apixaban-likely upper GI given the melanotic stools.  On Protonix infusion n.p.o. in anticipation of EGD.  3 units of PRBC transfusion has been ordered.  Dr. Haywood Lasso to be seeing patient in consult.  Holding apixaban. 2. Acute blood loss anemia follow CBC after transfusion. 3. CAD status post recent CABG presently n.p.o.  Receiving 3 units of PRBC.  Denies any chest pain. 4. A. fib closely monitor heart rate.  Apixaban on hold due to GI bleed. 5. Sleep apnea on CPAP. 6. Chronic kidney disease stage II creatinine mildly increased but likely from bleeding.  Follow CBC. 7. Morbid obesity.  All oral medications on hold at this time.  Given that patient has acute GI bleed in the setting of apixaban will need inpatient status for close monitoring.   DVT prophylaxis: SCDs. Code Status: Full code. Family Communication: Discussed with patient. Disposition Plan: Home. Consults called: Gastroenterology. Admission status: Inpatient.   Rise Patience MD [facility] Pager [phone].  If 7PM-7AM, please contact night-coverage [email or web address] Password [email or web address]  11/09/2018, 1:01 AM

## 2018-11-09 NOTE — Progress Notes (Signed)
Patient seen and examined at bedside, patient admitted after midnight, please see earlier detailed admission note by Rise Patience, MD. Briefly, patient presented secondary to recurrent melena and found to have profound anemia requiring 3 units of PRBC. Complicated by Eliquis use which appears to be secondary to event of atrial fibrillation post-op. GI consulted with plans for EGD in AM. Serial H&H. Clear liquid diet. Protonix drip.   Cordelia Poche, MD [facility] 11/09/2018, 12:49 PM

## 2018-11-09 NOTE — Consult Note (Addendum)
Oscar [name]: 8:23 AM 11/09/2018  LOS: 1 day    Referring Provider: Dr Cordelia Poche  Primary Care Physician: Does not have one.  He tends to see specialist Primary Gastroenterologist:  Dr Silvano Rusk    Reason for Consultation:  Melena, anemia with Hgb 4.7.     HPI: Oscar [name] is a 58 y.o. male.  Hx hypertension.  Hyperlipidemia.  OSA on CPAP.  COPD.  Glucose intolerance.  CKD?.  2014 laparoscopic ventral hernia repair, his fourth.  Developed ventral hernia after emergency appendectomy ~ 2002.    05/2016 colonoscopy: For evaluation of hematochezia.  Severe sigmoid diverticulosis active bleeding coming from a diverticulum.  Endoclips placed.  Small sigmoid colon polyp removed, polypectomy site clipped.  Nonbleeding internal hemorrhoids.  Hepatitis A at 58 years old. Pathology polypoid mucosa with erosion and granulation consistent with inflammatory polyp.  Recall colonoscopy due 05/2026  10/21/2018 -10/27/2018 admission with acute STEMI,, spontaneous resolution of ST elevation.  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  EF > 50%.  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 developed A. fib, treated with amiodarone and Eliquis, converted to NSR.  Discharged on Lasix for volume overload and Eliquis.  4 days of progressive weakness, DOE, softer black stools.  No chest or abdominal pain, nausea, vomiting, heartburn, anorexia, NSAIDs.  After his bypass he continued on his usual walking regimen of 2 miles a day.  Last Thursday he noticed that it was more difficult to do this.  By Sunday, yesterday, he was significantly dyspneic fatigued and presyncopal with just walking from the bed to the bathroom and would have to stop even during that short distance. Came to the ED.  Mild hypotension, elevated lactate improved with fluids.  FOBT positive.   Hgb late July trending lower from 13.6 on 7/23 to 9.6 on 7/27.  4.7 late last night.  MCV 101. Received 3 PRBCs, recheck Hgb pending for 1045 today. WBCs 25.9 Platelets currently 518.  Were as low as 129 on 7/24. INR 1.4 BUN/creatinine 74/2.0, was 42/1.8 on 7/27. Lactate elevated.  Troponin 47 (normal <18) COVID-19 negative  Last Eliquis was taken yesterday morning, 11/08/2018.   Protonix drip in place.  Patient is very proactive as far as his health goes.  He has a Wellsite geologist at [facility] and knew that he had CAD but his heart was well perfused on previous cardiac studies.  He lifts weights and does aerobic exercise regularly, rode his bike 20 miles on the Saturday just before he experienced his MI.   Weight is stable around 250 pounds.   Drinks 1 or 2 glasses of wine per week on average.  Does not smoke. 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  No family history of ulcer disease or GI bleeding or colon cancer.  Significant history of vascular disease in parents, siblings with strokes, early CAD, MI.  Father died as a result of complications following gastric bypass     Past Medical History:  Diagnosis Date  . Adenomatous polyp   . Chickenpox   . 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  Diverticulitis   . Hepatitis A    as a child, no current liver problems  . Hyperlipidemia   . Hypertension    he also sees Dr. Lynita Lombard in [place], [place]   . Lower GI bleed    Hx: of  . Myocardial infarction ([place])   . Nephrolithiasis    2-3 kidney stones in past  . Obesity   . Pneumonia   . Prediabetes   . Sleep apnea 2003   CPAP machine, pt does not know settings    Past Surgical History:  Procedure Laterality Date  . APPENDECTOMY   10-75yrs ago  . CLOSED REDUCTION FINGER WITH PERCUTANEOUS PINNING Left 04/22/2013   Procedure: LEFT WRIST CLOSED REDUCTION CONVERTED TO OPEN REDUCTION INTERNAL FIXATION, LEFT CARPAL TUNNEL RELEASE;  Surgeon: Linna Hoff, MD;  Location: [facility];   Service: Orthopedics;  Laterality: Left;  . COLONOSCOPY  07-09-09   benign polyp repeat 5 years, Dr.Sam Penelope Coop  . CORONARY ARTERY BYPASS GRAFT N/A 10/22/2018   Procedure: CORONARY ARTERY BYPASS GRAFTING (CABG) times four using right and left internal mammary arteries, left radial artery, and right greater saphenous vein harvested endoscopically.;  Surgeon: Wonda Olds, MD;  Location: [facility];  Service: Open Heart Surgery;  Laterality: N/A;  . CORONARY ARTERY BYPASS GRAFT    . CORONARY/GRAFT ACUTE MI REVASCULARIZATION N/A 10/21/2018   Procedure: Coronary/Graft Acute MI Revascularization;  Surgeon: Belva Crome, MD;  Location: [facility];  Service: Cardiovascular;  Laterality: N/A;  . HERNIA REPAIR     repaired x 4  . INSERTION OF MESH  04/09/2012   Procedure: INSERTION OF MESH;  Surgeon: Earnstine Regal, MD;  Location: [facility];  Service: General;  Laterality: N/A;  . LEFT HEART CATH AND CORONARY ANGIOGRAPHY N/A 10/21/2018   Procedure: LEFT HEART CATH AND CORONARY ANGIOGRAPHY;  Surgeon: Belva Crome, MD;  Location: [facility];  Service: Cardiovascular;  Laterality: N/A;  . RADIAL ARTERY HARVEST Left 10/22/2018   Procedure: LEFT RADIAL ARTERY HARVEST;  Surgeon: Wonda Olds, MD;  Location: [facility];  Service: Open Heart Surgery;  Laterality: Left;  . TEE WITHOUT CARDIOVERSION N/A 10/22/2018   Procedure: TRANSESOPHAGEAL ECHOCARDIOGRAM (TEE);  Surgeon: Wonda Olds, MD;  Location: [facility];  Service: Open Heart Surgery;  Laterality: N/A;  . TONSILLECTOMY  as child  . UVULECTOMY  yrs ago   for snoring  . VENTRAL HERNIA REPAIR  04/09/2012   Procedure: LAPAROSCOPIC VENTRAL HERNIA;  Surgeon: Earnstine Regal, MD;  Location: [facility];  Service: General;  Laterality: N/A;  Laparoscopic Ventral Incisional Hernia Repair with Mesh    Prior to Admission medications   Medication Sig Start Date End Date Taking? Authorizing Provider  acetaminophen (TYLENOL) 500 MG tablet Take 2 tablets (1,000 mg  total) by mouth every 6 (six) hours as needed. 10/27/18   Barrett, Erin R, PA-C  acidophilus (RISAQUAD) CAPS capsule Take 1 capsule by mouth 2 (two) times daily.    [provider]  ALPRAZolam Duanne Moron) 1 MG tablet Take 1 mg by mouth at bedtime as needed for sleep. 09/23/18   [provider]  amiodarone (PACERONE) 200 MG tablet Take 2 tablets (400 mg total) by mouth 2 (two) times daily. X 7 days, then decrease to 200 mg BID x 7 days, then 200 mg daily 10/27/18   Barrett, Erin R, PA-C  Anastrozole (ARIMIDEX PO) Take 0.25 mg by mouth See admin instructions. MON and THUR    [provider]  apixaban (ELIQUIS) 5 MG TABS tablet Take 1 tablet (5 mg total) by mouth 2 (two) times daily. 10/27/18   Barrett, Lodema Hong, PA-C  aspirin EC 81 MG tablet Take 1 tablet (81 mg total) by mouth daily. 10/27/18   Barrett, Erin R, PA-C  colchicine 0.6 MG tablet Take 1 tablet (0.6 mg total) by mouth daily. 10/27/18   Barrett, Erin R, PA-C  DHEA 50 MG CAPS Take 50 mg by mouth daily.    [provider]  ezetimibe (ZETIA) 10 MG tablet Take 10 mg by mouth daily.    [provider]  furosemide (LASIX) 40 MG tablet Take 1 tablet (40 mg total) by mouth daily. 10/27/18 10/27/19  Barrett, Erin R, PA-C  isosorbide dinitrate (ISORDIL) 10 MG tablet Take 1 tablet (10 mg total) by mouth 3 (three) times daily. 10/27/18   Barrett, Erin R, PA-C  metFORMIN (GLUCOPHAGE) 500 MG tablet Take 1,000 mg by mouth 2 (two) times daily. Pt takes one tablet in the morning and two at night.    [provider]  metoprolol tartrate (LOPRESSOR) 25 MG tablet Take 1 tablet (25 mg total) by mouth 2 (two) times daily. 10/27/18   Barrett, Erin R, PA-C  oxyCODONE 10 MG TABS Take 0.5-1 tablets (5-10 mg total) by mouth every 4 (four) hours as needed for severe pain. 10/27/18   Barrett, Erin R, PA-C  potassium chloride 20 MEQ TBCR Take 20 mEq by mouth daily. 10/27/18   Barrett, Erin R, PA-C  rosuvastatin (CRESTOR) 40 MG tablet  Take 1 tablet (40 mg total) by mouth daily. Patient taking differently: Take 20 mg by mouth daily.  10/24/15   Lelon Perla, MD  Turmeric (QC TUMERIC COMPLEX PO) Take 1 capsule by mouth daily.    [provider]  vitamin C (ASCORBIC ACID) 500 MG tablet Take 1,000 mg by mouth 2 (two) times daily.    [provider]    Scheduled Meds: . [START ON 11/12/2018] pantoprazole  40 mg Intravenous Q12H   Infusions: . pantoprozole (PROTONIX) infusion 8 mg/hr (11/08/18 2312)   PRN Meds: ondansetron **OR** ondansetron (ZOFRAN) IV   Allergies as of 11/08/2018 - Review Complete 11/08/2018  Allergen Reaction Noted  . Acetaminophen Other (See Comments) 10/25/2014  . Ibuprofen Other (See Comments) 10/25/2014  . Advair diskus [fluticasone-salmeterol] Cough 11/01/2015  . Latex Itching 09/23/2017    Family History  Problem Relation Age of Onset  . Stroke Mother        late 33s  . CAD Mother        MI in her 57s  . CAD Father 7  . Marfan syndrome Brother   . Diabetes Brother   . Hypertension Brother   . Obesity Brother   . Colon cancer Neg Hx     Social History   Socioeconomic History  . Marital status: Married    Spouse name: Baxter Flattery  . Number of children: 4  . Years of education: 37  . Highest education level: Not on file  Occupational History  . Occupation: Self employed: Owns Programmer, multimedia: [employer]  Social Needs  . Financial resource strain: Not on file  . Food insecurity    Worry: Not on file    Inability: Not on file  . Transportation needs    Medical: Not on file    Non-medical: Not on file  Tobacco Use  . Smoking status: Never Smoker  . Smokeless tobacco:  Never Used  Substance and Sexual Activity  . Alcohol use: Yes    Alcohol/week: 0.0 standard drinks    Comment: 2-3 glasses 2-3 times a week-wine; occasional liquior drinks  . Drug use: No  . Sexual activity: Never  Lifestyle  . Physical activity    Days  per week: Not on file    Minutes per session: Not on file  . Stress: Not on file  Relationships  . Social Herbalist on phone: Not on file    Gets together: Not on file    Attends religious service: Not on file    Active member of club or organization: Not on file    Attends meetings of clubs or organizations: Not on file    Relationship status: Not on file  . Intimate partner violence    Fear of current or ex partner: Not on file    Emotionally abused: Not on file    Physically abused: Not on file    Forced sexual activity: Not on file  Other Topics Concern  . Not on file  Social History Narrative   Married.  Lives with wife.  Independent of ADLs.   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 ENT:  No nose bleeds Pulm: No cough.  See HPI. CV:  No palpitations, no LE edema.  GU:  No hematuria, no frequency GI: See HPI.  No history liver problems or elevated LFTs Heme: Has had some bruising on his arms where they harvested vein and drew blood.. Transfusions: Prior transfusion.  Just completed his third unit. Neuro:  No headaches, no peripheral tingling or numbness Derm:  No itching, no rash or sores.  Endocrine:  No sweats or chills.  No polyuria or dysuria Immunization: Not queried. Travel:  None beyond local counties in last few months.    PHYSICAL EXAM: Vital signs in last 24 hours: Vitals:   11/09/18 0615 11/09/18 0804  BP: 106/72 108/64  Pulse: 91 87  Resp: (!) 21 (!) 24  Temp:  98.6 F (37 C)  SpO2: 99% 100%   Wt Readings from Last 3 Encounters:  11/09/18 111.6 kg  10/30/18 119.7 kg  10/27/18 121.3 kg    General: Well-appearing, overweight, comfortable Head: No facial asymmetry or swelling.  No signs of head trauma. Eyes: Conjunctiva slightly pale Ears: Not hard of hearing Nose: No congestion or discharge. Mouth: Oropharynx moist, pink, clear.  Good dentition.  Tongue midline. Neck: No JVD, no masses, no  thyromegaly. Lungs: Fine rales in the left base.  No cough.  No dyspnea. Heart: RRR.  No MRG.  Neurotomy scar healing nicely and is CDI. Abdomen: Soft, not tender.  No distention.  Rectal: Deferred. Musc/Skeltl: No joint swelling or redness.  No gross joint deformities. Extremities: No CCE E in the lower extremities.  Some swelling in the right forearm in the region of the harvest incision.  That incision is intact, clean, dry, only nicely. Neurologic: Somewhat pressured speech.  Some trouble remembering simple things such as whether or not he took his p.m. doses of meds last night, when he started seeing the dark stools.  However he is alert and oriented x3.  No tremors.  No gross weakness or deficits. Skin: Lots of freckles all over.  No suspicious lesions.  No sores. Tattoos: Multiple on arms, trunk, right leg. Nodes: No cervical adenopathy Psych: Cooperative, pleasant, pressured speech gives the impression of some anxiety.  Intake/Output from previous day:  08/09 0701 - 08/10 0700 In: 1337.7 [I.V.:128.7; Blood:1209] Out: 1525 [Urine:1525] Intake/Output this shift: Total I/O In: -  Out: 400 [Urine:400]  LAB RESULTS: Recent Labs    11/08/18 2022  WBC 25.9*  HGB 4.7*  HCT 15.9*  PLT 518*   BMET Lab Results  Component Value Date   NA 132 (L) 11/08/2018   NA 136 10/27/2018   NA 137 10/26/2018   K 4.5 11/08/2018   K 3.5 10/27/2018   K 3.7 10/26/2018   CL 100 11/08/2018   CL 101 10/27/2018   CL 99 10/26/2018   CO2 16 (L) 11/08/2018   CO2 27 10/27/2018   CO2 27 10/26/2018   GLUCOSE 152 (H) 11/08/2018   GLUCOSE 110 (H) 10/27/2018   GLUCOSE 118 (H) 10/26/2018   BUN 74 (H) 11/08/2018   BUN 35 (H) 10/27/2018   BUN 42 (H) 10/26/2018   CREATININE 2.04 (H) 11/08/2018   CREATININE 1.47 (H) 10/27/2018   CREATININE 1.81 (H) 10/26/2018   CALCIUM 8.4 (L) 11/08/2018   CALCIUM 7.5 (L) 10/27/2018   CALCIUM 7.6 (L) 10/26/2018   LFT Recent Labs    11/08/18 2022  PROT 5.4*   ALBUMIN 2.5*  AST 23  ALT 20  ALKPHOS 49  BILITOT 0.3   PT/INR Lab Results  Component Value Date   INR 1.4 (H) 10/22/2018   INR 1.1 10/21/2018   INR 1.02 12/26/2012   C-Diff No components found for: CDIFF Lipase     Component Value Date/Time   LIPASE 19 04/17/2012 1120    Drugs of Abuse  No results found for: LABOPIA, COCAINSCRNUR, LABBENZ, AMPHETMU, THCU, LABBARB   RADIOLOGY STUDIES: Dg Chest Port 1 View  Result Date: 11/08/2018 CLINICAL DATA:  58 year old male with shortness of breath and weakness. EXAM: PORTABLE CHEST 1 VIEW COMPARISON:  Chest radiograph dated 10/30/2018 FINDINGS: Trace pleural effusions may be present. No focal consolidation or pneumothorax. Stable mild cardiomegaly. Median sternotomy wires and CABG vascular clips. No acute osseous pathology. Old healed left clavicular fracture. IMPRESSION: No focal consolidation.  No interval change. Electronically Signed   By: Anner Crete M.D.   On: 11/08/2018 20:18     IMPRESSION:   *    GI bleed in setting of Eliquis.  Tarry stools and elevated BUN suggest upper GI source.  Protonix drip in place  *     Blood loss anemia.  3 PRBCs thus far.  MCV slightly elevated.  *     Multivessel CAD.  4 V CABG 7/23 after spontaneous resolution of STEMI.    *   Post op a fib 09/2018.  Discharged on Eliquis, Amiodarone.   INR 1.4.   Last dose of Eliquis was 11/08/2018 in the a.m. (wife confirmed)  *   CKD vs recurrent AKI  *    Diverticular bleeding 05/2016.     RECOMMENDATIONS:     *   EGD, timing today vs tmrw   Azucena Freed  11/09/2018, 8:23 AM Phone [phone]     Attending Physician Note   I have taken a history, examined the patient and reviewed the chart. I agree with the Advanced Practitioner's note, impression and recommendations.   Assessment: GI bleed, strongly suspect UGI source given melena and elevated BUN. R/O ulcer, gastritis, etc   ABL anemia S/P STEMI on 7/22 S/P 4 V CABG on 7/23 Post  op afib on Eliquis  CKD with AKI History of diverticular bleed in 2018  Recommendations: Hold Eliquis IV pantoprazole  infusion Transfuse to maintain Hb > 7 Trend CBC EGD tomorrow with Dr. Bryan Lemma, allow Eliqius wash out   Lucio Edward, MD [facility]

## 2018-11-10 ENCOUNTER — Encounter (HOSPITAL_COMMUNITY)
Admission: EM | Disposition: A | Discharge status: Home / Self Care | Payer: Self-pay | Source: Home / Self Care | Attending: Family Medicine

## 2018-11-10 ENCOUNTER — Inpatient Hospital Stay (HOSPITAL_COMMUNITY): Payer: No Typology Code available for payment source | Admitting: Anesthesiology

## 2018-11-10 ENCOUNTER — Telehealth: Payer: Self-pay | Admitting: Internal Medicine

## 2018-11-10 ENCOUNTER — Encounter (HOSPITAL_COMMUNITY): Payer: Self-pay | Admitting: Certified Registered Nurse Anesthetist

## 2018-11-10 DIAGNOSIS — I2510 Atherosclerotic heart disease of native coronary artery without angina pectoris: Secondary | ICD-10-CM

## 2018-11-10 DIAGNOSIS — K3189 Other diseases of stomach and duodenum: Secondary | ICD-10-CM

## 2018-11-10 DIAGNOSIS — D649 Anemia, unspecified: Secondary | ICD-10-CM

## 2018-11-10 DIAGNOSIS — K222 Esophageal obstruction: Secondary | ICD-10-CM

## 2018-11-10 DIAGNOSIS — I10 Essential (primary) hypertension: Secondary | ICD-10-CM

## 2018-11-10 HISTORY — PX: ESOPHAGOGASTRODUODENOSCOPY (EGD) WITH PROPOFOL: SHX5813

## 2018-11-10 HISTORY — PX: BIOPSY: SHX5522

## 2018-11-10 LAB — BASIC METABOLIC PANEL
Anion gap: 9 (ref 5–15)
BUN: 32 mg/dL — ABNORMAL HIGH (ref 6–20)
CO2: 22 mmol/L (ref 22–32)
Calcium: 7.5 mg/dL — ABNORMAL LOW (ref 8.9–10.3)
Chloride: 107 mmol/L (ref 98–111)
Creatinine, Ser: 1.35 mg/dL — ABNORMAL HIGH (ref 0.61–1.24)
GFR calc Af Amer: >60 mL/min (ref >60)
GFR calc non Af Amer: 57 mL/min — ABNORMAL LOW (ref >60)
Glucose, Bld: 121 mg/dL — ABNORMAL HIGH (ref 70–99)
Potassium: 4 mmol/L (ref 3.5–5.1)
Sodium: 138 mmol/L (ref 135–145)

## 2018-11-10 LAB — BPAM RBC
Blood Product Expiration Date: 202008132359
Blood Product Expiration Date: 202008262359
Blood Product Expiration Date: 202008262359
Blood Product Expiration Date: 202009042359
Blood Product Expiration Date: 202009042359
ISSUE DATE / TIME: 202008092206
ISSUE DATE / TIME: 202008100211
ISSUE DATE / TIME: 202008100502
ISSUE DATE / TIME: 202008101943
ISSUE DATE / TIME: 202008102312
Unit Type and Rh: 5100
Unit Type and Rh: 5100
Unit Type and Rh: 5100
Unit Type and Rh: 5100
Unit Type and Rh: 9500

## 2018-11-10 LAB — GLUCOSE, CAPILLARY
Glucose-Capillary: 104 mg/dL — ABNORMAL HIGH (ref 70–99)
Glucose-Capillary: 104 mg/dL — ABNORMAL HIGH (ref 70–99)
Glucose-Capillary: 83 mg/dL (ref 70–99)

## 2018-11-10 LAB — TYPE AND SCREEN
ABO/RH(D): O POS
Antibody Screen: NEGATIVE
Unit division: 0
Unit division: 0
Unit division: 0
Unit division: 0
Unit division: 0

## 2018-11-10 LAB — HEMOGLOBIN AND HEMATOCRIT, BLOOD
HCT: 25 % — ABNORMAL LOW (ref 39.0–52.0)
HCT: 25.3 % — ABNORMAL LOW (ref 39.0–52.0)
HCT: 25.7 % — ABNORMAL LOW (ref 39.0–52.0)
Hemoglobin: 8.1 g/dL — ABNORMAL LOW (ref 13.0–17.0)
Hemoglobin: 8.2 g/dL — ABNORMAL LOW (ref 13.0–17.0)
Hemoglobin: 8.4 g/dL — ABNORMAL LOW (ref 13.0–17.0)

## 2018-11-10 LAB — CBC
HCT: 25 % — ABNORMAL LOW (ref 39.0–52.0)
Hemoglobin: 8.3 g/dL — ABNORMAL LOW (ref 13.0–17.0)
MCH: 31.2 pg (ref 26.0–34.0)
MCHC: 33.2 g/dL (ref 30.0–36.0)
MCV: 94 fL (ref 80.0–100.0)
Platelets: 325 10*3/uL (ref 150–400)
RBC: 2.66 MIL/uL — ABNORMAL LOW (ref 4.22–5.81)
RDW: 17.8 % — ABNORMAL HIGH (ref 11.5–15.5)
WBC: 16.8 10*3/uL — ABNORMAL HIGH (ref 4.0–10.5)
nRBC: 2.6 % — ABNORMAL HIGH (ref 0.0–0.2)

## 2018-11-10 LAB — PATHOLOGIST SMEAR REVIEW

## 2018-11-10 SURGERY — ESOPHAGOGASTRODUODENOSCOPY (EGD) WITH PROPOFOL
Anesthesia: Monitor Anesthesia Care

## 2018-11-10 MED ORDER — LIDOCAINE 2% (20 MG/ML) 5 ML SYRINGE
INTRAMUSCULAR | Status: DC | PRN
  Administered 2018-11-10: 100 mg via INTRAVENOUS

## 2018-11-10 MED ORDER — ONDANSETRON HCL 4 MG/2ML IJ SOLN
INTRAMUSCULAR | Status: DC | PRN
  Administered 2018-11-10: 4 mg via INTRAVENOUS

## 2018-11-10 MED ORDER — PROPOFOL 10 MG/ML IV BOLUS
INTRAVENOUS | Status: DC | PRN
  Administered 2018-11-10: 20 mg via INTRAVENOUS
  Administered 2018-11-10: 30 mg via INTRAVENOUS
  Administered 2018-11-10: 20 mg via INTRAVENOUS

## 2018-11-10 MED ORDER — PROPOFOL 500 MG/50ML IV EMUL
INTRAVENOUS | Status: DC | PRN
  Administered 2018-11-10: 125 ug/kg/min via INTRAVENOUS

## 2018-11-10 MED ORDER — LACTATED RINGERS IV SOLN
INTRAVENOUS | Status: DC
  Administered 2018-11-10: 12:00:00 via INTRAVENOUS

## 2018-11-10 SURGICAL SUPPLY — 15 items

## 2018-11-10 NOTE — Anesthesia Preprocedure Evaluation (Addendum)
Anesthesia Evaluation  Patient identified by MRN, date of birth, ID band Patient awake    Reviewed: Allergy & Precautions, NPO status , Patient's Chart, lab work & pertinent test results  History of Anesthesia Complications Negative for: history of anesthetic complications  Airway Mallampati: III  TM Distance: >3 FB Neck ROM: Full  Mouth opening: Limited Mouth Opening  Dental no notable dental hx. (+) Dental Advisory Given   Pulmonary sleep apnea and Continuous Positive Airway Pressure Ventilation ,    Pulmonary exam normal        Cardiovascular hypertension, + CAD, + Past MI and + CABG  Normal cardiovascular exam   Severe three-vessel CAD including proximal LAD, mid to distal circumflex, and mid RCA.   Acute inferior STEMI presentation with spontaneous resolution of ST elevation  Mild inferior wall hypokinesis.  EF greater than 50%.    IMPRESSIONS    1. The left ventricle has normal systolic function with an ejection fraction of 60-65%. The cavity size was normal. There is moderately increased left ventricular wall thickness. Left ventricular diastolic Doppler parameters are consistent with  pseudonormalization.  2. The right ventricle has normal systolic function. The cavity was normal. There is no increase in right ventricular wall thickness.  3. The aortic valve is tricuspid. Moderate thickening of the aortic valve. Moderate calcification of the aortic valve.  4. The aorta is normal in size and structure.    Neuro/Psych negative neurological ROS  negative psych ROS   GI/Hepatic negative GI ROS, Neg liver ROS,   Endo/Other  diabetes  Renal/GU Renal InsufficiencyRenal disease     Musculoskeletal negative musculoskeletal ROS (+)   Abdominal   Peds  Hematology negative hematology ROS (+)   Anesthesia Other Findings Day of surgery medications reviewed with the patient.  Reproductive/Obstetrics                             Anesthesia Physical  Anesthesia Plan  ASA: III  Anesthesia Plan: MAC   Post-op Pain Management:    Induction:   PONV Risk Score and Plan: 2 and Ondansetron and Propofol infusion  Airway Management Planned: Natural Airway  Additional Equipment:   Intra-op Plan:   Post-operative Plan:   Informed Consent: I have reviewed the patients History and Physical, chart, labs and discussed the procedure including the risks, benefits and alternatives for the proposed anesthesia with the patient or authorized representative who has indicated his/her understanding and acceptance.     Dental advisory given  Plan Discussed with: Anesthesiologist  Anesthesia Plan Comments:        Anesthesia Quick Evaluation

## 2018-11-10 NOTE — Interval H&P Note (Signed)
History and Physical Interval Note:  11/10/2018 12:15 PM  Oscar [name]  has presented today for surgery, with the diagnosis of GI bleed.  The various methods of treatment have been discussed with the patient and family. After consideration of risks, benefits and other options for treatment, the patient has consented to  Procedure(s): ESOPHAGOGASTRODUODENOSCOPY (EGD) WITH PROPOFOL (N/A) as a surgical intervention.  The patient's history has been reviewed, patient examined, no change in status, stable for surgery.  I have reviewed the patient's chart and labs.  Questions were answered to the patient's satisfaction.     Dominic Pea Paullette Mckain

## 2018-11-10 NOTE — Progress Notes (Signed)
PROGRESS NOTE    Oscar [name]  LSL:[id number] DOB: [birth year] DOA: 11/08/2018 PCP: System, Pcp Not In   Brief Narrative: Oscar [name] is a 58 y.o. male with history of hypertension, chronic kidney disease who recently underwent CABG. Patient presented secondary to weakness and black tarry stools found to have evidence of GI bleeding and significant anemia.   Assessment & Plan:   Principal Problem:   Acute GI bleeding Active Problems:   Essential hypertension   Asthma   Obesity (BMI 30-39.9)   CAD, multiple vessel   S/P CABG x 4   Acute blood loss anemia   Symptomatic anemia   Duodenitis   Duodenal ulcer   Schatzki's ring of distal esophagus   Acute GI bleeding Hemoglobin of 4.7 on admission. Likely upper GI. Still having melanotic stools. He has received 5 units of PRBC to date. Patient started on Protonix drip. GI consulted and performed EGD on 8/11 significant for large duodenal ulcer without stigmata of bleeding. -GI recommendations: Protonix, hold Eliquis -CBC in AM -Goal hemoglobin >8  Atrial fibrillation Appears to have been post op and transient. Started on amiodarone and Eliquis on previous day of discharge. Currently sinus rhythm. Eliquis held secondary to above. CHA2DS2-VASc Score is 1. -Continue amiodarone  Sleep apnea -CPAP qhs  AKI on CKD stage III Baseline creatinine of 1.3. Peak of 2.04. Creatinine improving with IV fluids, now back to baseline.  Leukocytosis Likely stress related secondary to profound anemia. Improving.  CAD Patient is s/p CABG x4 last month.  Obesity Body mass index is 31.59 kg/m.   DVT prophylaxis: SCDs Code Status:   Code Status: Full Code Family Communication: None Disposition Plan: Discharge home pending GI recommendations   Consultants:   [facility]  Procedures:   EGD (11/10/2018) Impression:               - Normal esophagus.                           - Normal stomach. Biopsied.                           -  Large, cratered, clean-based, non-bleeding                            posterior duodenal bulb ulcer with no stigmata of                            bleeding. Biopsied.                           - Erythematous duodenopathy.                           - Normal second portion of the duodenum. Recommendation:           - Return patient to hospital ward for ongoing care.                           - Clear liquid diet today.                           - Use Protonix (pantoprazole) 40 mg IV BID for now,  then plan to transition to PRotionix 40 mg PO BID                            x8 weeks to promote ulcer healing. Recommend                            ongoing long-term PPI prophylaxis.                           - Await pathology results.                           - Repeat upper endoscopy in 8 weeks to check                            healing.                           - If acute rebleeding, given location and size of                            posterior duodenal bulb ulcer, recommend IR                            consultation for possible coil embolization.                           - If possible, please continue to hold Eliquis for                            now to allow for continued ulcer healing.  Antimicrobials:  None    Subjective: Melenotic stool this morning.  Objective: Vitals:   11/10/18 1114 11/10/18 1155 11/10/18 1244 11/10/18 1254  BP: 105/79 (!) 166/69 99/61 (!) 117/50  Pulse: 98 82 72 74  Resp: (!) 21 12 14 16   Temp: 98.4 F (36.9 C) 98.8 F (37.1 C) 97.8 F (36.6 C)   TempSrc: Oral Temporal Temporal   SpO2: 95% 98% 97% 100%  Weight:      Height:        Intake/Output Summary (Last 24 hours) at 11/10/2018 1350 Last data filed at 11/10/2018 1238 Gross per 24 hour  Intake 1630.22 ml  Output 1325 ml  Net 305.22 ml   Filed Weights   11/09/18 0209  Weight: 111.6 kg    Examination:  General exam: Appears calm and comfortable Respiratory  system: Clear to auscultation. Respiratory effort normal. Cardiovascular system: S1 & S2 heard, RRR. No murmurs, rubs, gallops or clicks. Gastrointestinal system: Abdomen is nondistended, soft and nontender. No organomegaly or masses felt. Normal bowel sounds heard. Central nervous system: Alert and oriented. No focal neurological deficits. Extremities: No edema. No calf tenderness Skin: No cyanosis. No rashes. Chest incision without erythema Psychiatry: Judgement and insight appear normal. Mood & affect appropriate.     Data Reviewed: I have personally reviewed following labs and imaging studies  CBC: Recent Labs  Lab 11/08/18 2022 11/09/18 1100 11/09/18 1651 11/10/18 0553 11/10/18 0953  WBC 25.9* 21.0*  --  16.8*  --   NEUTROABS 22.3*  --   --   --   --  HGB 4.7* 7.5* 7.2* 8.3* 8.1*  HCT 15.9* 23.2* 21.9* 25.0* 25.0*  MCV 101.3* 95.1  --  94.0  --   PLT 518* 388  --  325  --    Basic Metabolic Panel: Recent Labs  Lab 11/08/18 2022 11/09/18 1100 11/10/18 0553  NA 132* 138 138  K 4.5 4.0 4.0  CL 100 108 107  CO2 16* 21* 22  GLUCOSE 152* 141* 121*  BUN 74* 67* 32*  CREATININE 2.04* 1.73* 1.35*  CALCIUM 8.4* 8.1* 7.5*  MG  --  2.4  --   PHOS  --  5.0*  --    GFR: Estimated Creatinine Clearance: 79.3 mL/min (A) (by C-G formula based on SCr of 1.35 mg/dL (H)). Liver Function Tests: Recent Labs  Lab 11/08/18 2022  AST 23  ALT 20  ALKPHOS 49  BILITOT 0.3  PROT 5.4*  ALBUMIN 2.5*   No results for input(s): LIPASE, AMYLASE in the last 168 hours. No results for input(s): AMMONIA in the last 168 hours. Coagulation Profile: Recent Labs  Lab 11/09/18 1100  INR 1.5*   Cardiac Enzymes: No results for input(s): CKTOTAL, CKMB, CKMBINDEX, TROPONINI in the last 168 hours. BNP (last 3 results) No results for input(s): PROBNP in the last 8760 hours. HbA1C: No results for input(s): HGBA1C in the last 72 hours. CBG: Recent Labs  Lab 11/09/18 0809 11/09/18 1210  11/09/18 1625 11/09/18 2114 11/10/18 0637  GLUCAP 142* 129* 106* 97 104*   Lipid Profile: No results for input(s): CHOL, HDL, LDLCALC, TRIG, CHOLHDL, LDLDIRECT in the last 72 hours. Thyroid Function Tests: No results for input(s): TSH, T4TOTAL, FREET4, T3FREE, THYROIDAB in the last 72 hours. Anemia Panel: No results for input(s): VITAMINB12, FOLATE, FERRITIN, TIBC, IRON, RETICCTPCT in the last 72 hours. Sepsis Labs: Recent Labs  Lab 11/08/18 2022 11/08/18 2142 11/09/18 1100  LATICACIDVEN 7.3* 6.2* 1.8    Recent Results (from the past 240 hour(s))  SARS Coronavirus 2 [facility] order, Performed in [facility] hospital lab) Nasopharyngeal Nasopharyngeal Swab     Status: None   Collection Time: 11/08/18  9:26 PM   Specimen: Nasopharyngeal Swab  Result Value Ref Range Status   SARS Coronavirus 2 NEGATIVE NEGATIVE Final    Comment: (NOTE) If result is NEGATIVE SARS-CoV-2 target nucleic acids are NOT DETECTED. The SARS-CoV-2 RNA is generally detectable in upper and lower  respiratory specimens during the acute phase of infection. The lowest  concentration of SARS-CoV-2 viral copies this assay can detect is 250  copies / mL. A negative result does not preclude SARS-CoV-2 infection  and should not be used as the sole basis for treatment or other  patient management decisions.  A negative result may occur with  improper specimen collection / handling, submission of specimen other  than nasopharyngeal swab, presence of viral mutation(s) within the  areas targeted by this assay, and inadequate number of viral copies  (<250 copies / mL). A negative result must be combined with clinical  observations, patient history, and epidemiological information. If result is POSITIVE SARS-CoV-2 target nucleic acids are DETECTED. The SARS-CoV-2 RNA is generally detectable in upper and lower  respiratory specimens dur ing the acute phase of infection.  Positive  results are indicative of active  infection with SARS-CoV-2.  Clinical  correlation with patient history and other diagnostic information is  necessary to determine patient infection status.  Positive results do  not rule out bacterial infection or co-infection with other viruses. If result is PRESUMPTIVE POSTIVE  SARS-CoV-2 nucleic acids MAY BE PRESENT.   A presumptive positive result was obtained on the submitted specimen  and confirmed on repeat testing.  While 2019 novel coronavirus  (SARS-CoV-2) nucleic acids may be present in the submitted sample  additional confirmatory testing may be necessary for epidemiological  and / or clinical management purposes  to differentiate between  SARS-CoV-2 and other Sarbecovirus currently known to infect humans.  If clinically indicated additional testing with an alternate test  methodology [phone]) is advised. The SARS-CoV-2 RNA is generally  detectable in upper and lower respiratory sp ecimens during the acute  phase of infection. The expected result is Negative. Fact Sheet for Patients:  StrictlyIdeas.no Fact Sheet for Healthcare Providers: BankingDealers.co.za This test is not yet approved or cleared by the [place] FDA and has been authorized for detection and/or diagnosis of SARS-CoV-2 by FDA under an Emergency Use Authorization (EUA).  This EUA will remain in effect (meaning this test can be used) for the duration of the COVID-19 declaration under Section 564(b)(1) of the Act, 21 U.S.C. section 360bbb-3(b)(1), unless the authorization is terminated or revoked sooner. Performed at [facility], [place] [address], [place], [place] [address]   MRSA PCR Screening     Status: None   Collection Time: 11/09/18  2:02 AM   Specimen: Nasal Mucosa; Nasopharyngeal  Result Value Ref Range Status   MRSA by PCR NEGATIVE NEGATIVE Final    Comment:        The GeneXpert MRSA Assay (FDA approved for NASAL specimens only), is one  component of a comprehensive MRSA colonization surveillance program. It is not intended to diagnose MRSA infection nor to guide or monitor treatment for MRSA infections. Performed at [facility], [place] [address], [place], [place] [address]          Radiology Studies: Dg Chest [place] 1 View  Result Date: 11/08/2018 CLINICAL DATA:  58 year old male with shortness of breath and weakness. EXAM: PORTABLE CHEST 1 VIEW COMPARISON:  Chest radiograph dated 10/30/2018 FINDINGS: Trace pleural effusions may be present. No focal consolidation or pneumothorax. Stable mild cardiomegaly. Median sternotomy wires and CABG vascular clips. No acute osseous pathology. Old healed left clavicular fracture. IMPRESSION: No focal consolidation.  No interval change. Electronically Signed   By: Anner Crete M.D.   On: 11/08/2018 20:18        Scheduled Meds: . sodium chloride   Intravenous Once  . amiodarone  200 mg Oral Daily  . ezetimibe  10 mg Oral Daily  . insulin aspart  0-9 Units Subcutaneous TID WC  . [START ON 11/12/2018] pantoprazole  40 mg Intravenous Q12H  . rosuvastatin  20 mg Oral Daily   Continuous Infusions:   LOS: 2 days     Cordelia Poche, MD [facility] 11/10/2018, 1:50 PM  If 7PM-7AM, please contact night-coverage [email or web address]

## 2018-11-10 NOTE — Transfer of Care (Signed)
Immediate Anesthesia Transfer of Care Note  Patient: Oscar [name]  Procedure(s) Performed: ESOPHAGOGASTRODUODENOSCOPY (EGD) WITH PROPOFOL (N/A ) BIOPSY  Patient Location: Endoscopy Unit  Anesthesia Type:MAC  Level of Consciousness: awake, alert  and oriented  Airway & Oxygen Therapy: Patient Spontanous Breathing and Patient connected to nasal cannula oxygen  Post-op Assessment: Report given to RN and Post -op Vital signs reviewed and stable  Post vital signs: Reviewed and stable  Last Vitals:  Vitals Value Taken Time  BP 97/52 11/10/18 1244  Temp 36.6 C 11/10/18 1244  Pulse 72 11/10/18 1245  Resp 20 11/10/18 1245  SpO2 100 % 11/10/18 1245  Vitals shown include unvalidated device data.  Last Pain:  Vitals:   11/10/18 1244  TempSrc: Temporal  PainSc: 0-No pain         Complications: No apparent anesthesia complications

## 2018-11-10 NOTE — Op Note (Signed)
[facility] Patient Name: Oscar [name] Procedure Date : 11/10/2018 MRN: [id number] Attending MD: Gerrit Heck , MD Date of Birth: 05-16-1960 CSN: [id number] Age: 58 Admit Type: Inpatient Procedure:                Upper GI endoscopy Indications:              Acute post hemorrhagic anemia, Melena                           58 yo male with recent admission for STEMI on 7/22                            s/p 4V CABG on 7/23 with subsequent AFib, started                            on Eliquis and ASA 81 mg during last admission. He                            now presents with a 4 day history of melena,                            weakness, SOB, DOE. Admission Hgb 4.7 from 9.6 at                            time of hospital discharge 2 weeks ago. Baseline                            Hgb 13. BUN/Creat 7/2 and lactic acidosis (lactate                            7.3) on admission, with now normal lactate.                            Transfused a total of 5U pRBCs since admssion, with                            Hgb 8.3 this morning. Eliquis held on admission.                            BUN/Creat improved to 32/1.3. Providers:                Gerrit Heck, MD, Baird Cancer, RN, Ashley Jacobs, RN, Cherylynn Ridges, Technician Referring MD:              Medicines:                Monitored Anesthesia Care Complications:            No immediate complications. Estimated Blood Loss:     Estimated blood loss was minimal. Procedure:                Pre-Anesthesia Assessment:                           -  Prior to the procedure, a History and Physical                            was performed, and patient medications and                            allergies were reviewed. The patient's tolerance of                            previous anesthesia was also reviewed. The risks                            and benefits of the procedure and the sedation   options and risks were discussed with the patient.                            All questions were answered, and informed consent                            was obtained. Prior Anticoagulants: The patient has                            taken Eliquis (apixaban), last dose was 2 days                            prior to procedure. ASA Grade Assessment: III - A                            patient with severe systemic disease. After                            reviewing the risks and benefits, the patient was                            deemed in satisfactory condition to undergo the                            procedure.                           After obtaining informed consent, the endoscope was                            passed under direct vision. Throughout the                            procedure, the patient's blood pressure, pulse, and                            oxygen saturations were monitored continuously. The                            [id number]-H190 ([id number] Olympus gastroscope was  introduced through the mouth, and advanced to the                            second part of duodenum. The upper GI endoscopy was                            accomplished without difficulty. The patient                            tolerated the procedure well. Scope In: Scope Out: Findings:      A non-obstructing and mild Schatzki ring was found in the lower third of       the esophagus.      The examined esophagus was normal.      The entire examined stomach was normal. Biopsies were taken with a cold       forceps for Helicobacter pylori testing. Estimated blood loss was       minimal.      One non-bleeding cratered duodenal ulcer with no stigmata of bleeding       was found in the duodenal bulb. The lesion was 15 mm in largest       dimension. Biopsies were taken with a cold forceps for histology.       Estimated blood loss was minimal.      Localized moderately erythematous,  edematous mucosa, with 3 small,       non-bleeding, clean-based, shallow ulcers was found in the first portion       of the duodenum. No high grade stigmata of bleeding.      The second portion of the duodenum was normal. Impression:               - Normal esophagus.                           - Normal stomach. Biopsied.                           - Large, cratered, clean-based, non-bleeding                            posterior duodenal bulb ulcer with no stigmata of                            bleeding. Biopsied.                           - Erythematous duodenopathy.                           - Normal second portion of the duodenum. Recommendation:           - Return patient to hospital ward for ongoing care.                           - Clear liquid diet today.                           - Use Protonix (pantoprazole) 40 mg IV BID for now,  then plan to transition to PRotionix 40 mg PO BID                            x8 weeks to promote ulcer healing. Recommend                            ongoing long-term PPI prophylaxis.                           - Await pathology results.                           - Repeat upper endoscopy in 8 weeks to check                            healing.                           - If acute rebleeding, given location and size of                            posterior duodenal bulb ulcer, recommend IR                            consultation for possible coil embolization.                           - If possible, please continue to hold Eliquis for                            now to allow for continued ulcer healing. Procedure Code(s):        --- Professional ---                           [phone], Esophagogastroduodenoscopy, flexible,                            transoral; with biopsy, single or multiple Diagnosis Code(s):        --- Professional ---                           K26.9, Duodenal ulcer, unspecified as acute or                             chronic, without hemorrhage or perforation                           K31.89, Other diseases of stomach and duodenum                           D62, Acute posthemorrhagic anemia                           K92.1, Melena (includes Hematochezia) CPT copyright 2019 American Medical Association. All rights reserved. The codes documented in this report are preliminary and  upon coder review may  be revised to meet current compliance requirements. Gerrit Heck, MD 11/10/2018 12:53:11 PM Number of Addenda: 0

## 2018-11-10 NOTE — Anesthesia Postprocedure Evaluation (Signed)
Anesthesia Post Note  Patient: Oscar [name]  Procedure(s) Performed: ESOPHAGOGASTRODUODENOSCOPY (EGD) WITH PROPOFOL (N/A ) BIOPSY     Patient location during evaluation: PACU Anesthesia Type: MAC Level of consciousness: awake and alert Pain management: pain level controlled Vital Signs Assessment: post-procedure vital signs reviewed and stable Respiratory status: spontaneous breathing, nonlabored ventilation and respiratory function stable Cardiovascular status: stable and blood pressure returned to baseline Anesthetic complications: no    Last Vitals:  Vitals:   11/10/18 1244 11/10/18 1254  BP: 99/61 (!) 117/50  Pulse: 72 74  Resp: 14 16  Temp: 36.6 C   SpO2: 97% 100%    Last Pain:  Vitals:   11/10/18 1244  TempSrc: Temporal  PainSc: 0-No pain                 Audry Pili

## 2018-11-10 NOTE — Telephone Encounter (Signed)
Reviewed EGD results and answered ?   Will coordinate w/ Dr. Bryan Lemma tomorrow.  Hgb stable  I would lean toward getting back on ASA soon (tomorrow) and holding Eliquis x 1- 2 weeks   I will arrange f/u me in office also - will work in next week most likely

## 2018-11-11 ENCOUNTER — Other Ambulatory Visit: Payer: Self-pay | Admitting: Internal Medicine

## 2018-11-11 DIAGNOSIS — K2980 Duodenitis without bleeding: Secondary | ICD-10-CM

## 2018-11-11 DIAGNOSIS — J45909 Unspecified asthma, uncomplicated: Secondary | ICD-10-CM

## 2018-11-11 DIAGNOSIS — D649 Anemia, unspecified: Secondary | ICD-10-CM

## 2018-11-11 DIAGNOSIS — K260 Acute duodenal ulcer with hemorrhage: Secondary | ICD-10-CM

## 2018-11-11 DIAGNOSIS — Z951 Presence of aortocoronary bypass graft: Secondary | ICD-10-CM

## 2018-11-11 DIAGNOSIS — K922 Gastrointestinal hemorrhage, unspecified: Secondary | ICD-10-CM

## 2018-11-11 DIAGNOSIS — D62 Acute posthemorrhagic anemia: Secondary | ICD-10-CM

## 2018-11-11 DIAGNOSIS — K269 Duodenal ulcer, unspecified as acute or chronic, without hemorrhage or perforation: Secondary | ICD-10-CM

## 2018-11-11 DIAGNOSIS — E669 Obesity, unspecified: Secondary | ICD-10-CM

## 2018-11-11 LAB — CBC
HCT: 26.2 % — ABNORMAL LOW (ref 39.0–52.0)
Hemoglobin: 8.2 g/dL — ABNORMAL LOW (ref 13.0–17.0)
MCH: 30.6 pg (ref 26.0–34.0)
MCHC: 31.3 g/dL (ref 30.0–36.0)
MCV: 97.8 fL (ref 80.0–100.0)
Platelets: 324 10*3/uL (ref 150–400)
RBC: 2.68 MIL/uL — ABNORMAL LOW (ref 4.22–5.81)
RDW: 18.6 % — ABNORMAL HIGH (ref 11.5–15.5)
WBC: 12.1 10*3/uL — ABNORMAL HIGH (ref 4.0–10.5)
nRBC: 2 % — ABNORMAL HIGH (ref 0.0–0.2)

## 2018-11-11 LAB — GLUCOSE, CAPILLARY
Glucose-Capillary: 105 mg/dL — ABNORMAL HIGH (ref 70–99)
Glucose-Capillary: 169 mg/dL — ABNORMAL HIGH (ref 70–99)

## 2018-11-11 LAB — PHOSPHORUS: Phosphorus: 3.6 mg/dL (ref 2.5–4.6)

## 2018-11-11 LAB — COMPREHENSIVE METABOLIC PANEL
ALT: 15 U/L (ref 0–44)
AST: 16 U/L (ref 15–41)
Albumin: 2.5 g/dL — ABNORMAL LOW (ref 3.5–5.0)
Alkaline Phosphatase: 52 U/L (ref 38–126)
Anion gap: 7 (ref 5–15)
BUN: 18 mg/dL (ref 6–20)
CO2: 23 mmol/L (ref 22–32)
Calcium: 7.8 mg/dL — ABNORMAL LOW (ref 8.9–10.3)
Chloride: 107 mmol/L (ref 98–111)
Creatinine, Ser: 1.2 mg/dL (ref 0.61–1.24)
GFR calc Af Amer: >60 mL/min (ref >60)
GFR calc non Af Amer: >60 mL/min (ref >60)
Glucose, Bld: 112 mg/dL — ABNORMAL HIGH (ref 70–99)
Potassium: 3.8 mmol/L (ref 3.5–5.1)
Sodium: 137 mmol/L (ref 135–145)
Total Bilirubin: 0.7 mg/dL (ref 0.3–1.2)
Total Protein: 5 g/dL — ABNORMAL LOW (ref 6.5–8.1)

## 2018-11-11 LAB — MAGNESIUM: Magnesium: 2.1 mg/dL (ref 1.7–2.4)

## 2018-11-11 MED ORDER — METOPROLOL TARTRATE 25 MG PO TABS
25.0000 mg | ORAL_TABLET | Freq: Two times a day (BID) | ORAL | Status: DC
  Administered 2018-11-11: 25 mg via ORAL
  Filled 2018-11-11: qty 1

## 2018-11-11 MED ORDER — ALPRAZOLAM 0.5 MG PO TABS: 1.0000 mg | ORAL_TABLET | Freq: Every evening | ORAL | Status: DC | PRN

## 2018-11-11 MED ORDER — PANTOPRAZOLE SODIUM 40 MG PO TBEC: 40.0000 mg | DELAYED_RELEASE_TABLET | Freq: Two times a day (BID) | ORAL | Status: DC

## 2018-11-11 MED ORDER — PANTOPRAZOLE SODIUM 40 MG IV SOLR
40.0000 mg | Freq: Two times a day (BID) | INTRAVENOUS | Status: AC
  Administered 2018-11-11: 40 mg via INTRAVENOUS
  Filled 2018-11-11: qty 40

## 2018-11-11 MED ORDER — ASPIRIN EC 81 MG PO TBEC
81.0000 mg | DELAYED_RELEASE_TABLET | Freq: Every day | ORAL | Status: DC
  Administered 2018-11-11: 81 mg via ORAL
  Filled 2018-11-11: qty 1

## 2018-11-11 MED ORDER — RISAQUAD PO CAPS
1.0000 | ORAL_CAPSULE | Freq: Two times a day (BID) | ORAL | Status: DC
  Administered 2018-11-11: 1 via ORAL
  Filled 2018-11-11 (×2): qty 1

## 2018-11-11 MED ORDER — ISOSORBIDE DINITRATE 10 MG PO TABS
10.0000 mg | ORAL_TABLET | Freq: Three times a day (TID) | ORAL | Status: DC
  Administered 2018-11-11: 10 mg via ORAL
  Filled 2018-11-11: qty 1

## 2018-11-11 MED ORDER — PANTOPRAZOLE SODIUM 40 MG PO TBEC: 40.0000 mg | DELAYED_RELEASE_TABLET | Freq: Two times a day (BID) | ORAL | 0 refills | Status: DC

## 2018-11-11 NOTE — Progress Notes (Addendum)
Daily Rounding Note  11/11/2018, 8:25 AM  LOS: 3 days   SUBJECTIVE:   Chief complaint:   GI bleed.  Melena.  Blood loss anemia. Patient feels well.  No nausea.  Tolerating clears.  Hungry. Having a hard time understanding why he has ulcers since he is "never had them before", is thinking that his recent CABG has something to do with causing his ulcer.  OBJECTIVE:         Vital signs in last 24 hours:    Temp:  [97.8 F (36.6 C)-98.8 F (37.1 C)] 98.1 F (36.7 C) (08/12 0414) Pulse Rate:  [72-98] 78 (08/12 0300) Resp:  [12-21] 21 (08/12 0414) BP: (99-166)/(50-79) 124/71 (08/12 0414) SpO2:  [95 %-100 %] 99 % (08/12 0300) Last BM Date: 11/10/18 Filed Weights   11/09/18 0209  Weight: 111.6 kg   General: Looks well.  Alert.  Comfortable. Heart: RRR. Chest: No labored breathing or cough.  Lungs clear bilaterally.  Sternotomy scar CDI. Abdomen: Soft.  Not tender or distended.  Active bowel sounds. Extremities: Vein harvest site in arm CDI.  No edema. Neuro/Psych: Oriented x3.  Fluid speech.  No gross deficits.  Intake/Output from previous day: 08/11 0701 - 08/12 0700 In: 640 [P.O.:440; I.V.:200] Out: 825 [Urine:825]  Intake/Output this shift: No intake/output data recorded.  Lab Results: Recent Labs    11/09/18 1100  11/10/18 0553  11/10/18 1649 11/10/18 2214 11/11/18 0438  WBC 21.0*  --  16.8*  --   --   --  12.1*  HGB 7.5*   < > 8.3*   < > 8.2* 8.4* 8.2*  HCT 23.2*   < > 25.0*   < > 25.3* 25.7* 26.2*  PLT 388  --  325  --   --   --  324   < > = values in this interval not displayed.   BMET Recent Labs    11/08/18 2022 11/09/18 1100 11/10/18 0553  NA 132* 138 138  K 4.5 4.0 4.0  CL 100 108 107  CO2 16* 21* 22  GLUCOSE 152* 141* 121*  BUN 74* 67* 32*  CREATININE 2.04* 1.73* 1.35*  CALCIUM 8.4* 8.1* 7.5*   LFT Recent Labs    11/08/18 2022  PROT 5.4*  ALBUMIN 2.5*  AST 23  ALT 20  ALKPHOS  49  BILITOT 0.3   PT/INR Recent Labs    11/09/18 1100  LABPROT 18.2*  INR 1.5*    ASSESMENT:   *  Melena.  FOBT +.  Hgb 4.7 >> PRBCs x 5 >> 8.2.  Hgb stable.     EGD: large, clean based, non bleeding duod bulb ulcer, no bleeding stigmata.  Duodenitis.  Bx/path pndg.    *   10/22/2018 4 V CABG. Postop A. fib, started on Eliquis.   PLAN   *   Protonix 40 mg PO BID, start tonight.. okay to resume ASA today.  Dr. Carlean Purl prefers holding Eliquis and restart in 1 to 2 weeks, patient will want reassurance that this is okay with his cardiac surgeon..    *From GI standpoint, okay for discharge today.  Dr Carlean Purl plans fup in ~ 1 week, will notify pt with details.   *   If H Pylori +: treat with course of abx.        Azucena Freed  11/11/2018, 8:25 AM Phone [place] Attending   I have taken an  interval history, reviewed the chart and examined the patient. I agree with the Advanced Practitioner's note, impression and recommendations.   He is improved for dc On ASA off Eliquis Hospitalist has discussed w/ Dr. Orvan Seen and he is ok off Eliquis and I d/w hospitalist  After I f/u w/ him by phone or in person next week will plan next steps and resumption of Eliquis  Will need som iron later also  Gatha Mayer, MD, [facility] 11/11/2018 10:56 AM Pager [phone]

## 2018-11-11 NOTE — Discharge Summary (Signed)
Physician Discharge Summary  Oscar [name] JGG:[id number] DOB: Apr 11, 1960 DOA: 11/08/2018  PCP: System, Pcp Not In  Admit date: 11/08/2018 Discharge date: 11/11/2018  Admitted From: Home Disposition: Home  Recommendations for Outpatient Follow-up:  1. Follow up with PCP in 1-2 weeks 2. Follow up with [facility] Dr. Orvan Seen within 1-2 weeks 3. Follow up with Gastroenterology Dr. Carlean Purl within 1-2 weeks; Repeat Blood work scheduled on 8/17 by Dr. Carlean Purl  4. Please obtain CMP/CBC, Mag, Phos in one week 5. Please follow up on the following pending results:  Home Health: No Equipment/Devices: None    Discharge Condition: Stable CODE STATUS: FULL CODE  Diet recommendation: Heart Healthy Diet   Brief/Interim Summary: Oscar [name] is a 58 y.o. malewithhistory of hypertension, chronic kidney disease who recently underwent CABG. Patient presented secondary to weakness and black tarry stools found to have evidence of GI bleeding and significant anemia.  He was transfused 5 units of PRBCs and he underwent further work-up for his anemia and bleeding and was found to have a large cratered clean based nonbleeding posterior duodenal bulb ulcer with no stigmata of bleeding currently that was biopsied.  He also had some erythematous to adenopathy and a normal stomach and esophagus along with a normal second portion of the duodenum.  Patient was placed on a clear liquid diet and transitioned off of IV Protonix to Protonix 40 mg p.o. twice daily for at least 8 weeks.  Case was discussed with gastroenterology and cardiothoracic surgery who recommended no Eliquis for now and will defer to them about the timing of resuming Eliquis.  Aspirin was restarted.  Patient improved and was deemed stable for discharge  Discharge Diagnoses:  Principal Problem:   Acute GI bleeding Active Problems:   Essential hypertension   Asthma   Obesity (BMI 30-39.9)   CAD, multiple vessel   S/P CABG x 4   Acute blood loss anemia    Symptomatic anemia   Duodenitis   Duodenal ulcer   Schatzki's ring of distal esophagus  Acute GI bleeding in the setting of large cratered duodenal ulcer -Hemoglobin of 4.7 on admission.  -Likely upper GI.  -He was having melanotic stools but none today. He has received 5 units of PRBC to date.  -Patient started on Protonix drip and change to IV PPI twice daily and then change to 40 mg p.o. twice daily for at least 8 weeks -GI consulted and performed EGD on 8/11 significant for large duodenal ulcer without stigmata of bleeding. -GI recommendations: Protonix 40 mg p.o. twice daily, hold Eliquis and resume aspirin 81 mg -CBC this a.m. was stable and showed hemoglobin/hematocrit of 8.2/26.2 -Goal hemoglobin >8  Atrial fibrillation -Appears to have been post op and transient. Started on amiodarone and Eliquis on previous day of discharge. Currently sinus rhythm. Eliquis held secondary to above. CHA2DS2-VASc Scoreis 1. -Continue amiodarone 200 mg p.o. daily  Sleep apnea -CPAP qhs  AKI on CKD stage III -Baseline creatinine of 1.3. Peak of 2.04. Creatinine improving with IV fluids, now back to baseline and creatinine was 1.20. -Continue to monitor and trend renal function outpatient setting avoid nephrotoxic medications and possible  Leukocytosis -Likely stress related secondary to profound anemia and GI bleeding. Improving and WBC is now 12.1 -Currently afebrile no signs or symptoms of infection  CAD -Patient is s/p CABG x4 last month. -Started the patient back on aspirin 81 mg.  Also resumed home Isordil, metoprolol as well as rosuvastatin and Zetia.  Obesity -Estimated body mass index is  31.59 kg/m as calculated from the following:   Height as of this encounter: 6\' 2"  (1.88 m).   Weight as of this encounter: 111.6 kg. -Weight Loss and Dietary Counseling given   Prediabetes -Continue with home metformin  Anxiety -Continue with alprazolam 1 mg p.o. nightly as needed  sleep  Discharge Instructions  Discharge Instructions    (HEART FAILURE PATIENTS) Call MD:  Anytime you have any of the following symptoms: 1) 3 pound weight gain in 24 hours or 5 pounds in 1 week 2) shortness of breath, with or without a dry hacking cough 3) swelling in the hands, feet or stomach 4) if you have to sleep on extra pillows at night in order to breathe.   Complete by: As directed    Call MD for:  difficulty breathing, headache or visual disturbances   Complete by: As directed    Call MD for:  extreme fatigue   Complete by: As directed    Call MD for:  hives   Complete by: As directed    Call MD for:  persistant dizziness or light-headedness   Complete by: As directed    Call MD for:  persistant nausea and vomiting   Complete by: As directed    Call MD for:  redness, tenderness, or signs of infection (pain, swelling, redness, odor or green/yellow discharge around incision site)   Complete by: As directed    Call MD for:  severe uncontrolled pain   Complete by: As directed    Call MD for:  temperature >100.4   Complete by: As directed    Diet - low sodium heart healthy   Complete by: As directed    Discharge instructions   Complete by: As directed    You were cared for by a hospitalist during your hospital stay. If you have any questions about your discharge medications or the care you received while you were in the hospital after you are discharged, you can call the unit and ask to speak with the hospitalist on call if the hospitalist that took care of you is not available. Once you are discharged, your primary care physician will handle any further medical issues. Please note that NO REFILLS for any discharge medications will be authorized once you are discharged, as it is imperative that you return to your primary care physician (or establish a relationship with a primary care physician if you do not have one) for your aftercare needs so that they can reassess your need for  medications and monitor your lab values.  Follow up with PCP, Cardiothoracic Surgery, Cardiology, and Gastroenterology. Take all medications as prescribed. If symptoms change or worsen please return to the ED for evaluation   Increase activity slowly   Complete by: As directed      Allergies as of 11/11/2018      Reactions   Acetaminophen Other (See Comments)   Bleeding issues   Ibuprofen Other (See Comments)   Bleeding issues   Advair Diskus [fluticasone-salmeterol] Cough   Latex Itching      Medication List    STOP taking these medications   apixaban 5 MG Tabs tablet Commonly known as: ELIQUIS     TAKE these medications   acidophilus Caps capsule Take 1 capsule by mouth 2 (two) times daily.   ALPRAZolam 1 MG tablet Commonly known as: XANAX Take 1 mg by mouth at bedtime as needed for sleep.   amiodarone 200 MG tablet Commonly known as: PACERONE Take 2 tablets (  400 mg total) by mouth 2 (two) times daily. X 7 days, then decrease to 200 mg BID x 7 days, then 200 mg daily What changed:   when to take this  additional instructions   aspirin EC 81 MG tablet Take 1 tablet (81 mg total) by mouth daily.   DHEA 50 MG Caps Take 50 mg by mouth daily.   DIALYVITE VITAMIN D 5000 PO Take 1 tablet by mouth daily.   ezetimibe 10 MG tablet Commonly known as: ZETIA Take 10 mg by mouth daily.   isosorbide dinitrate 10 MG tablet Commonly known as: ISORDIL Take 1 tablet (10 mg total) by mouth 3 (three) times daily.   metFORMIN 500 MG tablet Commonly known as: GLUCOPHAGE Take 1,000 mg by mouth 2 (two) times daily. Pt takes one tablet in the morning and two at night.   metoprolol tartrate 25 MG tablet Commonly known as: LOPRESSOR Take 1 tablet (25 mg total) by mouth 2 (two) times daily.   Milk Thistle 1000 MG Caps Take 1 capsule by mouth daily.   OVER THE COUNTER MEDICATION Take 1 tablet by mouth daily. Primal defense probiotic tablet   Oxycodone HCl 10 MG Tabs Take  0.5-1 tablets (5-10 mg total) by mouth every 4 (four) hours as needed for severe pain.   pantoprazole 40 MG tablet Commonly known as: PROTONIX Take 1 tablet (40 mg total) by mouth 2 (two) times daily.   QC TUMERIC COMPLEX PO Take 1 capsule by mouth daily.   rosuvastatin 40 MG tablet Commonly known as: CRESTOR Take 1 tablet (40 mg total) by mouth daily. What changed: how much to take   Vitamin B-12 3000 MCG Subl Place 1 tablet under the tongue daily.   vitamin C 500 MG tablet Commonly known as: ASCORBIC ACID Take 1,000 mg by mouth 2 (two) times daily.      Follow-up Information    Lelon Perla, MD. Call.   Specialty: Cardiology Why: Follow up within 1 week  Contact information: [place] [address] [phone]        Wonda Olds, MD. Call.   Specialty: Cardiothoracic Surgery Why: Follow up within 1 week  Contact information: [place] [address] [phone]        Gatha Mayer, MD. Call on 11/16/2018.   Specialty: Gastroenterology Why: Go to lab in AM (after 8 AM) on 8/17 and have blood count checked and Dr. Carlean Purl will call results and plans   Contact information: [address] [address] [phone]          Allergies  Allergen Reactions  . Acetaminophen Other (See Comments)    Bleeding issues  . Ibuprofen Other (See Comments)    Bleeding issues  . Advair Diskus [Fluticasone-Salmeterol] Cough  . Latex Itching   Consultations:  Gastroenterology  Discussed Case with [facility] via Telephone   Procedures/Studies: Dg Chest 2 View  Result Date: 10/30/2018 CLINICAL DATA:  Status post CABG EXAM: CHEST - 2 VIEW COMPARISON:  October 26, 2018 FINDINGS: The heart size and mediastinal contours are stable. The heart size is in the largest. Small bilateral posterior pleural effusions are noted. Mild atelectasis of left lung base is identified. The right lung is clear. The  visualized skeletal structures are unremarkable. IMPRESSION: Small bilateral posterior pleural effusions noted. Mild atelectasis of left lung base. Electronically Signed   By: Abelardo Diesel M.D.   On: 10/30/2018 15:22  Dg Chest 2 View  Result Date: 10/26/2018 CLINICAL DATA:  Prior CABG.  Sore chest. EXAM: CHEST - 2 VIEW COMPARISON:  10/25/2018. FINDINGS: Interval removal of right IJ sheath, bilateral chest tubes, and mediastinal drainage catheter. Prior CABG. Stable cardiomegaly. Bibasilar atelectasis/infiltrates. Small bilateral pleural effusions. No pneumothorax. IMPRESSION: 1. Interim removal of right IJ sheath, bilateral chest tubes, mediastinal drainage catheter. 2.  Prior CABG.  Stable cardiomegaly. 3.  Mild bibasilar atelectasis/infiltrates. Electronically Signed   By: Marcello Moores  Register   On: 10/26/2018 08:28   Dg Chest 2 View  Result Date: 10/25/2018 CLINICAL DATA:  58 year old male with history of open heart surgery. Follow-up study. EXAM: CHEST - 2 VIEW COMPARISON:  Chest x-ray 10/24/2018. FINDINGS: Right IJ Cordis with tip terminating in the proximal superior vena cava. Bilateral chest tubes remain stable in position with tips projecting over the mid thorax bilaterally. Midline surgical drain, presumably a mediastinal drain. Lung volumes are low. Bibasilar opacities (left greater than right), favored to reflect areas of atelectasis in this postoperative patient. Small left pleural effusion. No definite right pleural effusion. No appreciable pneumothorax. No evidence of pulmonary edema. Enlargement of the cardiopericardial silhouette. Upper mediastinal contours appear widened, similar to the prior examination. Status post median sternotomy for CABG. IMPRESSION: 1. Postoperative changes and support apparatus, as above. 2. Decreasing lung volumes with persistent bibasilar postoperative subsegmental atelectasis. 3. Small left pleural effusion. Electronically Signed   By: Vinnie Langton M.D.   On:  10/25/2018 07:30   Dg Chest Port 1 View  Result Date: 11/08/2018 CLINICAL DATA:  58 year old male with shortness of breath and weakness. EXAM: PORTABLE CHEST 1 VIEW COMPARISON:  Chest radiograph dated 10/30/2018 FINDINGS: Trace pleural effusions may be present. No focal consolidation or pneumothorax. Stable mild cardiomegaly. Median sternotomy wires and CABG vascular clips. No acute osseous pathology. Old healed left clavicular fracture. IMPRESSION: No focal consolidation.  No interval change. Electronically Signed   By: Anner Crete M.D.   On: 11/08/2018 20:18   Dg Chest Port 1 View  Result Date: 10/24/2018 CLINICAL DATA:  Status post CABG postop day 2 EXAM: PORTABLE CHEST 1 VIEW COMPARISON:  10/23/2018 and prior exams FINDINGS: Cardiomegaly and CABG changes again noted. Fullness of the mediastinum again noted and most likely technical. A Swan-Ganz catheter has been removed with RIGHT IJ central venous catheter sheath remaining. Mediastinal and thoracostomy tubes are again noted. Mild pulmonary vascular congestion with LEFT LOWER lung consolidation/atelectasis and mild RIGHT basilar atelectasis again noted. There is no evidence of pneumothorax. IMPRESSION: Unchanged mediastinal fullness which is most likely technical. Consider follow-up as clinically indicated. RIGHT IJ Swan-Ganz catheter removal without other significant change. Electronically Signed   By: Margarette Canada M.D.   On: 10/24/2018 08:31   Dg Chest Port 1 View  Result Date: 10/23/2018 CLINICAL DATA:  Status post CABG EXAM: PORTABLE CHEST 1 VIEW COMPARISON:  10/22/2018 FINDINGS: Interval removal of endotracheal and enteric tubes. Right IJ approach Swan-Ganz catheter is similar in configuration to prior exam with coiling of the distal tip. Left chest tube and mediastinal drain remain in place. Heart size is is enlarged. Increasing left basilar opacity with probable small left pleural effusion. No discernible pneumothorax. IMPRESSION: 1.  Similar configuration of pulmonary arterial catheter with suggestion of coiling of the distal tip. Consider repositioning. 2. Increasing left basilar opacity which may reflect a combination of atelectasis and pleural fluid. Electronically Signed   By: Davina Poke M.D.   On: 10/23/2018 08:29   Dg Chest Port 1  View  Result Date: 10/22/2018 CLINICAL DATA:  Postop CABG. EXAM: PORTABLE CHEST 1 VIEW COMPARISON:  November 01, 2015 FINDINGS: An ETT is in good position. NG tube terminates below today's film. A right chest tube is noted. A PA catheter is identified. The distal PA catheter appears to form a loop in the pulmonary outflow tract. It is unclear whether the distal tip is located. No pneumothorax. Probable tiny right effusion. No focal infiltrate or overt edema. IMPRESSION: 1. The distal PA catheter appears to coil in the region of the PA catheter. It is unclear where the distal tip is located. Consider repositioning. 2. Other support apparatus as above. 3. Probable tiny right pleural effusion. Electronically Signed   By: Dorise Bullion III M.D   On: 10/22/2018 16:03   Vas [place] Upper Extremity Arterial Duplex  Result Date: 10/21/2018 UPPER EXTREMITY DUPLEX STUDY Indications: Preop for cabg.  Risk Factors: Hypertension, hyperlipidemia, coronary artery disease. Comparison Study: no prior Performing Technologist: Abram Sander RVS  Examination Guidelines: A complete evaluation includes B-mode imaging, spectral Doppler, color Doppler, and power Doppler as needed of all accessible portions of each vessel. Bilateral testing is considered an integral part of a complete examination. Limited examinations for reoccurring indications may be performed as noted.  Left Doppler Findings: +----------+----------+---------+------+--------+ Site      PSV (cm/s)Waveform PlaqueComments +----------+----------+---------+------+--------+ Subclavian88        triphasic                +----------+----------+---------+------+--------+ Axillary  122       triphasic               +----------+----------+---------+------+--------+ Brachial  87        triphasic               +----------+----------+---------+------+--------+ Radial    78        triphasic               +----------+----------+---------+------+--------+ Ulnar     72        triphasic               +----------+----------+---------+------+--------+  Summary:  Left: No obstruction visualized in the left upper extremity. *See table(s) above for measurements and observations. Electronically signed by Curt Jews MD on 10/21/2018 at 6:11:29 PM.    Final    Vas US Doppler Pre Cabg  Result Date: 10/21/2018 PREOPERATIVE VASCULAR EVALUATION  Indications:      Pre cabg. Risk Factors:     Hypertension, hyperlipidemia. Comparison Study: no prior Performing Technologist: Abram Sander RVS  Examination Guidelines: A complete evaluation includes B-mode imaging, spectral Doppler, color Doppler, and power Doppler as needed of all accessible portions of each vessel. Bilateral testing is considered an integral part of a complete examination. Limited examinations for reoccurring indications may be performed as noted.  Right Carotid Findings: +----------+--------+--------+--------+------------+--------+           PSV cm/sEDV cm/sStenosisDescribe    Comments +----------+--------+--------+--------+------------+--------+ CCA Prox  76      11              heterogenous         +----------+--------+--------+--------+------------+--------+ CCA Distal69      12              heterogenous         +----------+--------+--------+--------+------------+--------+ ICA Prox  49      14      1-39%   heterogenous         +----------+--------+--------+--------+------------+--------+  ICA Distal43      13                                   +----------+--------+--------+--------+------------+--------+ ECA       98      11                                    +----------+--------+--------+--------+------------+--------+ Portions of this table do not appear on this page. +----------+--------+-------+--------+------------+           PSV cm/sEDV cmsDescribeArm Pressure +----------+--------+-------+--------+------------+ Subclavian72                     129          +----------+--------+-------+--------+------------+ +---------+--------+--------+------------+ VertebralPSV cm/sEDV cm/sNot assessed +---------+--------+--------+------------+ Left Carotid Findings: +----------+--------+--------+--------+------------+--------+           PSV cm/sEDV cm/sStenosisDescribe    Comments +----------+--------+--------+--------+------------+--------+ CCA Prox  94      8               heterogenous         +----------+--------+--------+--------+------------+--------+ CCA Distal70      9               heterogenous         +----------+--------+--------+--------+------------+--------+ ICA Prox  47      14      1-39%   heterogenous         +----------+--------+--------+--------+------------+--------+ ICA Distal31      10                                   +----------+--------+--------+--------+------------+--------+ ECA       129                                          +----------+--------+--------+--------+------------+--------+ +----------+--------+--------+--------+------------+ SubclavianPSV cm/sEDV cm/sDescribeArm Pressure +----------+--------+--------+--------+------------+           118                     131          +----------+--------+--------+--------+------------+ +---------+--------+--+--------+-+---------+ VertebralPSV cm/s22EDV cm/s6Antegrade +---------+--------+--+--------+-+---------+  ABI Findings: +--------+------------------+-----+---------+--------+ Right   Rt Pressure (mmHg)IndexWaveform Comment  +--------+------------------+-----+---------+--------+  [id number]                    triphasic         +--------+------------------+-----+---------+--------+ PTA     150               1.15 triphasic         +--------+------------------+-----+---------+--------+ DP      145               1.11 triphasic         +--------+------------------+-----+---------+--------+ +--------+------------------+-----+---------+-------+ Left    Lt Pressure (mmHg)IndexWaveform Comment +--------+------------------+-----+---------+-------+ [id number]                    triphasic        +--------+------------------+-----+---------+-------+ PTA     162               1.24 triphasic        +--------+------------------+-----+---------+-------+ DP  143               1.09 triphasic        +--------+------------------+-----+---------+-------+ +-------+---------------+----------------+ ABI/TBIToday's ABI/TBIPrevious ABI/TBI +-------+---------------+----------------+ Right  1.15                            +-------+---------------+----------------+ Left   1.24                            +-------+---------------+----------------+  Right Doppler Findings: +--------+--------+-----+---------+--------+ Site    PressureIndexDoppler  Comments +--------+--------+-----+---------+--------+ [id number]          triphasic         +--------+--------+-----+---------+--------+ Radial               triphasic         +--------+--------+-----+---------+--------+ Ulnar                triphasic         +--------+--------+-----+---------+--------+  Left Doppler Findings: +--------+--------+-----+---------+--------+ Site    PressureIndexDoppler  Comments +--------+--------+-----+---------+--------+ [id number]          triphasic         +--------+--------+-----+---------+--------+ Radial               triphasic         +--------+--------+-----+---------+--------+ Ulnar                triphasic          +--------+--------+-----+---------+--------+  Summary: Right Carotid: Velocities in the right ICA are consistent with a 1-39% stenosis. Left Carotid: Velocities in the left ICA are consistent with a 1-39% stenosis. Vertebrals: Left vertebral artery demonstrates antegrade flow. Right vertebral             artery was not visualized. Right ABI: Resting right ankle-brachial index is within normal range. No evidence of significant right lower extremity arterial disease. Left ABI: Resting left ankle-brachial index is within normal range. No evidence of significant left lower extremity arterial disease. Right Upper Extremity: Doppler waveforms remain within normal limits with right radial compression. Doppler waveforms decrease 50% with right ulnar compression. Left Upper Extremity: Doppler waveforms remain within normal limits with left radial compression. Doppler waveforms remain within normal limits with left ulnar compression.  Electronically signed by Curt Jews MD on 10/21/2018 at 6:10:55 PM.    Final      Subjective: Seen and examined at bedside was feeling well.  Denies chest pain, lightheadedness or dizziness.  Wanted me to discuss with Dr. Orvan Seen about his cardiac regimen and I called and he states to hold Eliquis for now and continue aspirin 81 mg p.o. daily.  No other concerns complaints this time is patient ready go home as he is tolerating his diet without issues.  He will follow-up with Dr. Carlean Purl within a week and has an appointment for blood work on Monday.  Discharge Exam: Vitals:   11/11/18 0414 11/11/18 0839  BP: 124/71 128/79  Pulse:  80  Resp: (!) 21 16  Temp: 98.1 F (36.7 C) 97.9 F (36.6 C)  SpO2:  97%   Vitals:   11/10/18 2343 11/11/18 0300 11/11/18 0414 11/11/18 0839  BP: 126/68  124/71 128/79  Pulse:  78  80  Resp:   (!) 21 16  Temp: 98.5 F (36.9 C)  98.1 F (36.7 C) 97.9 F (36.6 C)  TempSrc:   Oral Oral  SpO2:  99%  97%  Weight:      Height:       General: Pt is  alert, awake, not in acute distress Cardiovascular: RRR, S1/S2 +, no rubs, no gallops Respiratory: CTA bilaterally, no wheezing, no rhonchi Abdominal: Soft, NT, Distended slightly, bowel sounds + Extremities: no LE edema, no cyanosis  The results of significant diagnostics from this hospitalization (including imaging, microbiology, ancillary and laboratory) are listed below for reference.    Microbiology: Recent Results (from the past 240 hour(s))  SARS Coronavirus 2 [facility] order, Performed in [facility] hospital lab) Nasopharyngeal Nasopharyngeal Swab     Status: None   Collection Time: 11/08/18  9:26 PM   Specimen: Nasopharyngeal Swab  Result Value Ref Range Status   SARS Coronavirus 2 NEGATIVE NEGATIVE Final    Comment: (NOTE) If result is NEGATIVE SARS-CoV-2 target nucleic acids are NOT DETECTED. The SARS-CoV-2 RNA is generally detectable in upper and lower  respiratory specimens during the acute phase of infection. The lowest  concentration of SARS-CoV-2 viral copies this assay can detect is 250  copies / mL. A negative result does not preclude SARS-CoV-2 infection  and should not be used as the sole basis for treatment or other  patient management decisions.  A negative result may occur with  improper specimen collection / handling, submission of specimen other  than nasopharyngeal swab, presence of viral mutation(s) within the  areas targeted by this assay, and inadequate number of viral copies  (<250 copies / mL). A negative result must be combined with clinical  observations, patient history, and epidemiological information. If result is POSITIVE SARS-CoV-2 target nucleic acids are DETECTED. The SARS-CoV-2 RNA is generally detectable in upper and lower  respiratory specimens dur ing the acute phase of infection.  Positive  results are indicative of active infection with SARS-CoV-2.  Clinical  correlation with patient history and other diagnostic information is   necessary to determine patient infection status.  Positive results do  not rule out bacterial infection or co-infection with other viruses. If result is PRESUMPTIVE POSTIVE SARS-CoV-2 nucleic acids MAY BE PRESENT.   A presumptive positive result was obtained on the submitted specimen  and confirmed on repeat testing.  While 2019 novel coronavirus  (SARS-CoV-2) nucleic acids may be present in the submitted sample  additional confirmatory testing may be necessary for epidemiological  and / or clinical management purposes  to differentiate between  SARS-CoV-2 and other Sarbecovirus currently known to infect humans.  If clinically indicated additional testing with an alternate test  methodology [phone]) is advised. The SARS-CoV-2 RNA is generally  detectable in upper and lower respiratory sp ecimens during the acute  phase of infection. The expected result is Negative. Fact Sheet for Patients:  StrictlyIdeas.no Fact Sheet for Healthcare Providers: BankingDealers.co.za This test is not yet approved or cleared by the [place] FDA and has been authorized for detection and/or diagnosis of SARS-CoV-2 by FDA under an Emergency Use Authorization (EUA).  This EUA will remain in effect (meaning this test can be used) for the duration of the COVID-19 declaration under Section 564(b)(1) of the Act, 21 U.S.C. section 360bbb-3(b)(1), unless the authorization is terminated or revoked sooner. Performed at [facility], [place] [address], [place], [place] [address]   MRSA PCR Screening     Status: None   Collection Time: 11/09/18  2:02 AM   Specimen: Nasal Mucosa; Nasopharyngeal  Result Value Ref Range Status   MRSA by PCR NEGATIVE NEGATIVE Final    Comment:  The GeneXpert MRSA Assay (FDA approved for NASAL specimens only), is one component of a comprehensive MRSA colonization surveillance program. It is not intended to diagnose  MRSA infection nor to guide or monitor treatment for MRSA infections. Performed at [facility], [place] [address], [place], [place] [address]     Labs: BNP (last 3 results) Recent Labs    10/22/18 0246  BNP 010.2*   Basic Metabolic Panel: Recent Labs  Lab 11/08/18 2022 11/09/18 1100 11/10/18 0553 11/11/18 0738  NA 132* 138 138 137  K 4.5 4.0 4.0 3.8  CL 100 108 107 107  CO2 16* 21* 22 23  GLUCOSE 152* 141* 121* 112*  BUN 74* 67* 32* 18  CREATININE 2.04* 1.73* 1.35* 1.20  CALCIUM 8.4* 8.1* 7.5* 7.8*  MG  --  2.4  --  2.1  PHOS  --  5.0*  --  3.6   Liver Function Tests: Recent Labs  Lab 11/08/18 2022 11/11/18 0738  AST 23 16  ALT 20 15  ALKPHOS 49 52  BILITOT 0.3 0.7  PROT 5.4* 5.0*  ALBUMIN 2.5* 2.5*   No results for input(s): LIPASE, AMYLASE in the last 168 hours. No results for input(s): AMMONIA in the last 168 hours. CBC: Recent Labs  Lab 11/08/18 2022 11/09/18 1100  11/10/18 0553 11/10/18 0953 11/10/18 1649 11/10/18 2214 11/11/18 0438  WBC 25.9* 21.0*  --  16.8*  --   --   --  12.1*  NEUTROABS 22.3*  --   --   --   --   --   --   --   HGB 4.7* 7.5*   < > 8.3* 8.1* 8.2* 8.4* 8.2*  HCT 15.9* 23.2*   < > 25.0* 25.0* 25.3* 25.7* 26.2*  MCV 101.3* 95.1  --  94.0  --   --   --  97.8  PLT 518* 388  --  325  --   --   --  324   < > = values in this interval not displayed.   Cardiac Enzymes: No results for input(s): CKTOTAL, CKMB, CKMBINDEX, TROPONINI in the last 168 hours. BNP: Invalid input(s): POCBNP CBG: Recent Labs  Lab 11/09/18 2114 11/10/18 0637 11/10/18 1619 11/10/18 2132 11/11/18 0636  GLUCAP 97 104* 104* 83 105*   D-Dimer No results for input(s): DDIMER in the last 72 hours. Hgb A1c No results for input(s): HGBA1C in the last 72 hours. Lipid Profile No results for input(s): CHOL, HDL, LDLCALC, TRIG, CHOLHDL, LDLDIRECT in the last 72 hours. Thyroid function studies No results for input(s): TSH, T4TOTAL, T3FREE, THYROIDAB in  the last 72 hours.  Invalid input(s): FREET3 Anemia work up No results for input(s): VITAMINB12, FOLATE, FERRITIN, TIBC, IRON, RETICCTPCT in the last 72 hours. Urinalysis    Component Value Date/Time   COLORURINE YELLOW 10/21/2018 1928   APPEARANCEUR CLEAR 10/21/2018 1928   LABSPEC 1.009 10/21/2018 1928   PHURINE 6.0 10/21/2018 1928   GLUCOSEU NEGATIVE 10/21/2018 1928   HGBUR SMALL (A) 10/21/2018 1928   BILIRUBINUR NEGATIVE 10/21/2018 1928   BILIRUBINUR n 06/18/2011 [place] 10/21/2018 1928   PROTEINUR NEGATIVE 10/21/2018 1928   UROBILINOGEN 0.2 06/18/2011 0907   NITRITE NEGATIVE 10/21/2018 1928   LEUKOCYTESUR NEGATIVE 10/21/2018 1928   Sepsis Labs Invalid input(s): PROCALCITONIN,  WBC,  LACTICIDVEN Microbiology Recent Results (from the past 240 hour(s))  SARS Coronavirus 2 [facility] order, Performed in [facility] hospital lab) Nasopharyngeal Nasopharyngeal Swab     Status: None  Collection Time: 11/08/18  9:26 PM   Specimen: Nasopharyngeal Swab  Result Value Ref Range Status   SARS Coronavirus 2 NEGATIVE NEGATIVE Final    Comment: (NOTE) If result is NEGATIVE SARS-CoV-2 target nucleic acids are NOT DETECTED. The SARS-CoV-2 RNA is generally detectable in upper and lower  respiratory specimens during the acute phase of infection. The lowest  concentration of SARS-CoV-2 viral copies this assay can detect is 250  copies / mL. A negative result does not preclude SARS-CoV-2 infection  and should not be used as the sole basis for treatment or other  patient management decisions.  A negative result may occur with  improper specimen collection / handling, submission of specimen other  than nasopharyngeal swab, presence of viral mutation(s) within the  areas targeted by this assay, and inadequate number of viral copies  (<250 copies / mL). A negative result must be combined with clinical  observations, patient history, and epidemiological information. If result  is POSITIVE SARS-CoV-2 target nucleic acids are DETECTED. The SARS-CoV-2 RNA is generally detectable in upper and lower  respiratory specimens dur ing the acute phase of infection.  Positive  results are indicative of active infection with SARS-CoV-2.  Clinical  correlation with patient history and other diagnostic information is  necessary to determine patient infection status.  Positive results do  not rule out bacterial infection or co-infection with other viruses. If result is PRESUMPTIVE POSTIVE SARS-CoV-2 nucleic acids MAY BE PRESENT.   A presumptive positive result was obtained on the submitted specimen  and confirmed on repeat testing.  While 2019 novel coronavirus  (SARS-CoV-2) nucleic acids may be present in the submitted sample  additional confirmatory testing may be necessary for epidemiological  and / or clinical management purposes  to differentiate between  SARS-CoV-2 and other Sarbecovirus currently known to infect humans.  If clinically indicated additional testing with an alternate test  methodology [phone]) is advised. The SARS-CoV-2 RNA is generally  detectable in upper and lower respiratory sp ecimens during the acute  phase of infection. The expected result is Negative. Fact Sheet for Patients:  StrictlyIdeas.no Fact Sheet for Healthcare Providers: BankingDealers.co.za This test is not yet approved or cleared by the [place] FDA and has been authorized for detection and/or diagnosis of SARS-CoV-2 by FDA under an Emergency Use Authorization (EUA).  This EUA will remain in effect (meaning this test can be used) for the duration of the COVID-19 declaration under Section 564(b)(1) of the Act, 21 U.S.C. section 360bbb-3(b)(1), unless the authorization is terminated or revoked sooner. Performed at [facility], [place] [address] [place], [place] [address]   MRSA PCR Screening     Status: None   Collection  Time: 11/09/18  2:02 AM   Specimen: Nasal Mucosa; Nasopharyngeal  Result Value Ref Range Status   MRSA by PCR NEGATIVE NEGATIVE Final    Comment:        The GeneXpert MRSA Assay (FDA approved for NASAL specimens only), is one component of a comprehensive MRSA colonization surveillance program. It is not intended to diagnose MRSA infection nor to guide or monitor treatment for MRSA infections. Performed at [facility], [place] [address], [place], [place] [address]    Time coordinating discharge: 35 minutes  SIGNED:  Kerney Elbe, DO [facility] 11/11/2018, 10:52 AM Pager is on [place]  If 7PM-7AM, please contact night-coverage [email or web address] Password [email or web address]

## 2018-11-11 NOTE — TOC Transition Note (Signed)
Transition of Care [facility]) - CM/SW Discharge Note   Patient Details  Name: Eduin Friedel MRN: [id number] Date of Birth: [birth year]  [facility]) CM/SW Contact:  Carles Collet, RN Phone Number: 11/11/2018, 11:03 AM   Clinical Narrative:   DC to home, no CM needs identified.     Final next level of care: Home/Self Care Barriers to Discharge: No Barriers Identified   Patient Goals and CMS Choice        Discharge Placement                       Discharge Plan and Services                                     Social Determinants of Health (SDOH) Interventions     Readmission Risk Interventions Readmission Risk Prevention Plan 11/11/2018 10/27/2018  Transportation Screening Complete Complete  PCP or Specialist Appt within 5-7 Days Complete Complete  Home Care Screening Not Complete Complete  Home Care Screening Not Completed Comments N/A -  Medication Review (RN CM) Complete Complete  Some recent data might be hidden

## 2018-11-11 NOTE — Progress Notes (Signed)
Discharged home by wheelchair, discharge instructions given. Belongings taken home.

## 2018-11-16 ENCOUNTER — Other Ambulatory Visit (INDEPENDENT_AMBULATORY_CARE_PROVIDER_SITE_OTHER): Payer: No Typology Code available for payment source

## 2018-11-16 ENCOUNTER — Telehealth: Payer: Self-pay | Admitting: Internal Medicine

## 2018-11-16 ENCOUNTER — Encounter: Payer: Self-pay | Admitting: Internal Medicine

## 2018-11-16 DIAGNOSIS — K922 Gastrointestinal hemorrhage, unspecified: Secondary | ICD-10-CM | POA: Diagnosis not present

## 2018-11-16 LAB — CBC
HCT: 27.8 % — ABNORMAL LOW (ref 39.0–52.0)
Hemoglobin: 9.1 g/dL — ABNORMAL LOW (ref 13.0–17.0)
MCHC: 32.6 g/dL (ref 30.0–36.0)
MCV: 91 fl (ref 78.0–100.0)
Platelets: 516 10*3/uL — ABNORMAL HIGH (ref 150.0–400.0)
RBC: 3.06 Mil/uL — ABNORMAL LOW (ref 4.22–5.81)
RDW: 16.8 % — ABNORMAL HIGH (ref 11.5–15.5)
WBC: 9 10*3/uL (ref 4.0–10.5)

## 2018-11-16 NOTE — Telephone Encounter (Signed)
Pt inquired about lab results he had this morning.

## 2018-11-16 NOTE — Telephone Encounter (Signed)
Left message for patient to call back  

## 2018-11-16 NOTE — Telephone Encounter (Signed)
Results provided to patient He needs a f/u me in September 'He will remain off Eliquis until he sees Dr. Orvan Seen of CT surgery Friday  We discussed his elevated Hgb'He has hx of taking testosterone, DHEA and an I think an aromatase inhibitor which may have been affecting Hgc   I will look that over more and suggest hematologist later this week    But for now set up f/u me in mid September

## 2018-11-17 ENCOUNTER — Ambulatory Visit
Admission: RE | Admit: 2018-11-17 | Discharge: 2018-11-17 | Disposition: A | Discharge status: Home / Self Care | Payer: No Typology Code available for payment source | Source: Ambulatory Visit | Attending: Cardiothoracic Surgery | Admitting: Cardiothoracic Surgery

## 2018-11-17 ENCOUNTER — Other Ambulatory Visit: Payer: Self-pay | Admitting: Cardiothoracic Surgery

## 2018-11-17 DIAGNOSIS — Z951 Presence of aortocoronary bypass graft: Secondary | ICD-10-CM

## 2018-11-17 NOTE — Telephone Encounter (Signed)
Left message for patient to call back  

## 2018-11-17 NOTE — Progress Notes (Unsigned)
Chest  

## 2018-11-17 NOTE — Telephone Encounter (Signed)
Follow up arranged for 12/17/18

## 2018-11-19 ENCOUNTER — Other Ambulatory Visit: Payer: Self-pay | Admitting: Cardiothoracic Surgery

## 2018-11-19 DIAGNOSIS — Z951 Presence of aortocoronary bypass graft: Secondary | ICD-10-CM

## 2018-11-20 ENCOUNTER — Ambulatory Visit (INDEPENDENT_AMBULATORY_CARE_PROVIDER_SITE_OTHER): Payer: Self-pay | Admitting: Cardiothoracic Surgery

## 2018-11-20 ENCOUNTER — Other Ambulatory Visit: Payer: Self-pay

## 2018-11-20 VITALS — BP 150/79 | HR 92 | Temp 97.8°F | Resp 20 | Ht 74.0 in | Wt 253.0 lb

## 2018-11-20 DIAGNOSIS — Z951 Presence of aortocoronary bypass graft: Secondary | ICD-10-CM

## 2018-11-20 DIAGNOSIS — I2510 Atherosclerotic heart disease of native coronary artery without angina pectoris: Secondary | ICD-10-CM

## 2018-11-22 NOTE — Progress Notes (Signed)
[address]       [place] [address]             [phone]     CARDIOTHORACIC SURGERY OFFICE NOTE  Referring Provider is Belva Crome, MD Primary Cardiologist is Kirk Ruths, MD PCP is System, Pcp Not In   HPI: Well-known to me after recent cabg and readmit for GIB. He returns for check-up. No complaints; would like to get back into the gym.    Current Outpatient Medications  Medication Sig Dispense Refill  . acidophilus (RISAQUAD) CAPS capsule Take 1 capsule by mouth 2 (two) times daily.    [place] Kitchen ALPRAZolam (XANAX) 1 MG tablet Take 1 mg by mouth at bedtime as needed for sleep.    [place] Kitchen amiodarone (PACERONE) 200 MG tablet Take 2 tablets (400 mg total) by mouth 2 (two) times daily. X 7 days, then decrease to 200 mg BID x 7 days, then 200 mg daily (Patient taking differently: Take 400 mg by mouth daily. Patient states he takes one tablet every day) 90 tablet 1  . aspirin EC 81 MG tablet Take 1 tablet (81 mg total) by mouth daily.    . Cholecalciferol (DIALYVITE VITAMIN D 5000 PO) Take 1 tablet by mouth daily.    . Cyanocobalamin (VITAMIN B-12) 3000 MCG SUBL Place 1 tablet under the tongue daily.    [place] Kitchen DHEA 50 MG CAPS Take 50 mg by mouth daily.    [place] Kitchen ezetimibe (ZETIA) 10 MG tablet Take 10 mg by mouth daily.    . isosorbide dinitrate (ISORDIL) 10 MG tablet Take 1 tablet (10 mg total) by mouth 3 (three) times daily. 90 tablet 0  . metFORMIN (GLUCOPHAGE) 500 MG tablet Take 1,000 mg by mouth 2 (two) times daily. Pt takes one tablet in the morning and two at night.    . metoprolol tartrate (LOPRESSOR) 25 MG tablet Take 1 tablet (25 mg total) by mouth 2 (two) times daily. 60 tablet 3  . Milk Thistle 1000 MG CAPS Take 1 capsule by mouth daily.    Mar[place] Kitchen OVER THE COUNTER MEDICATION Take 1 tablet by mouth daily. Primal defense probiotic tablet    . oxyCODONE 10 MG TABS Take 0.5-1 tablets (5-10 mg total) by mouth every 4 (four) hours as needed for severe pain. 30 tablet 0  .  pantoprazole (PROTONIX) 40 MG tablet Take 1 tablet (40 mg total) by mouth 2 (two) times daily. 60 tablet 0  . rosuvastatin (CRESTOR) 40 MG tablet Take 1 tablet (40 mg total) by mouth daily. (Patient taking differently: Take 20 mg by mouth daily. ) 90 tablet 3  . Turmeric (QC TUMERIC COMPLEX PO) Take 1 capsule by mouth daily.    . vitamin C (ASCORBIC ACID) 500 MG tablet Take 1,000 mg by mouth 2 (two) times daily.     No current facility-administered medications for this visit.       Physical Exam:   BP (!) 150/79   Pulse 92   Temp 97.8 F (36.6 C) (Skin)   Resp 20   Ht 6\' 2"  (1.88 m)   Wt 114.8 kg   SpO2 92% Comment: RA  BMI 32.48 kg/m   General:  Well appearing, NAD   Chest:   cta  CV:   rrr  Incisions:  C/d/i    Diagnostic Tests:  CXR with small effusions and mild interstitial edema   Impression:  Doing well after cabg although he did have quite a scare  with UGIB  Plan:  F/u as needed with CT surgery Ok to liberalize activity  I spent in excess of 20 minutes during the conduct of this office consultation and >50% of this time involved direct face-to-face encounter with the patient for counseling and/or coordination of their care.  Level 2                 10 minutes Level 3                 15 minutes Level 4                 25 minutes Level 5                 40 minutes  B. Murvin Natal, MD 11/22/2018 5:42 PM

## 2018-11-25 ENCOUNTER — Other Ambulatory Visit: Payer: Self-pay

## 2018-11-25 ENCOUNTER — Encounter: Payer: Self-pay | Admitting: Internal Medicine

## 2018-11-25 DIAGNOSIS — R718 Other abnormality of red blood cells: Secondary | ICD-10-CM

## 2018-11-25 DIAGNOSIS — K922 Gastrointestinal hemorrhage, unspecified: Secondary | ICD-10-CM

## 2018-11-25 DIAGNOSIS — D751 Secondary polycythemia: Secondary | ICD-10-CM

## 2018-11-25 HISTORY — DX: Secondary polycythemia: D75.1

## 2018-11-25 NOTE — Progress Notes (Signed)
He needs referral to Dr. Gunnar Bulla Magrinat re: erythrocytosis

## 2018-11-25 NOTE — Progress Notes (Signed)
Tell him I reviewed Cardiac surgery note  Ask him if he has restarted eliquis  Ask him to come tomorrow or Friday to get CBC and ferritin to f/u blood counts dx acute blood loss anemia

## 2018-11-26 ENCOUNTER — Other Ambulatory Visit (INDEPENDENT_AMBULATORY_CARE_PROVIDER_SITE_OTHER): Payer: No Typology Code available for payment source

## 2018-11-26 ENCOUNTER — Telehealth: Payer: Self-pay | Admitting: Oncology

## 2018-11-26 DIAGNOSIS — K922 Gastrointestinal hemorrhage, unspecified: Secondary | ICD-10-CM | POA: Diagnosis not present

## 2018-11-26 LAB — CBC
HCT: 30.5 % — ABNORMAL LOW (ref 39.0–52.0)
Hemoglobin: 9.8 g/dL — ABNORMAL LOW (ref 13.0–17.0)
MCHC: 32 g/dL (ref 30.0–36.0)
MCV: 84.8 fl (ref 78.0–100.0)
Platelets: 498 10*3/uL — ABNORMAL HIGH (ref 150.0–400.0)
RBC: 3.59 Mil/uL — ABNORMAL LOW (ref 4.22–5.81)
RDW: 19.4 % — ABNORMAL HIGH (ref 11.5–15.5)
WBC: 8.7 10*3/uL (ref 4.0–10.5)

## 2018-11-26 LAB — FERRITIN: Ferritin: 107.2 ng/mL (ref 22.0–322.0)

## 2018-11-26 NOTE — Progress Notes (Signed)
Hgb improving Iron levels ok  Communicated to patient via text

## 2018-11-26 NOTE — Telephone Encounter (Signed)
Received a new hem referral from Dr. Carlean Purl for Oscar [name] to see Dr. Jana Hakim. I scheduled Oscar [name] for labs on 9/10 at 9:15am along with an appt w/Dr. Jana Hakim on 9/17 at 4pm.

## 2018-11-26 NOTE — Progress Notes (Signed)
Will check again before 9/17 visit - communicated to him and ordered

## 2018-12-01 ENCOUNTER — Other Ambulatory Visit: Payer: Self-pay | Admitting: Oncology

## 2018-12-01 DIAGNOSIS — D751 Secondary polycythemia: Secondary | ICD-10-CM

## 2018-12-03 NOTE — Progress Notes (Deleted)
HPI: FU CAD.  Patient presented with acute inferior myocardial infarction July 2020.  He underwent cardiac catheterization which revealed an ejection fraction greater than 50%, mild inferior wall hypokinesis and severe three-vessel coronary artery disease.  Echocardiogram July 2020 showed normal LV function, moderate diastolic dysfunction.  Preoperative carotid Dopplers July 2020 showed 1 to 39% bilateral stenosis.  ABIs were normal.  Patient subsequently underwent coronary artery bypass and graft with a LIMA to the LAD, RIMA to the PDA, radial artery to the marginal and saphenous vein graft to the diagonal.  He developed atrial fibrillation postoperatively which was treated with amiodarone.  He later had an episode of atrial flutter and was placed on apixaban.  Following discharge she had a GI bleed.  EGD revealed a duodenal ulcer that was not bleeding.  Apixaban held.  Since last seen  Current Outpatient Medications  Medication Sig Dispense Refill  . acidophilus (RISAQUAD) CAPS capsule Take 1 capsule by mouth 2 (two) times daily.    [place] Kitchen ALPRAZolam (XANAX) 1 MG tablet Take 1 mg by mouth at bedtime as needed for sleep.    [place] Kitchen amiodarone (PACERONE) 200 MG tablet Take 2 tablets (400 mg total) by mouth 2 (two) times daily. X 7 days, then decrease to 200 mg BID x 7 days, then 200 mg daily (Patient taking differently: Take 400 mg by mouth daily. Patient states he takes one tablet every day) 90 tablet 1  . aspirin EC 81 MG tablet Take 1 tablet (81 mg total) by mouth daily.    . Cholecalciferol (DIALYVITE VITAMIN D 5000 PO) Take 1 tablet by mouth daily.    . Cyanocobalamin (VITAMIN B-12) 3000 MCG SUBL Place 1 tablet under the tongue daily.    Mar[place] Kitchen DHEA 50 MG CAPS Take 50 mg by mouth daily.    [place] Kitchen ezetimibe (ZETIA) 10 MG tablet Take 10 mg by mouth daily.    . isosorbide dinitrate (ISORDIL) 10 MG tablet Take 1 tablet (10 mg total) by mouth 3 (three) times daily. 90 tablet 0  . metFORMIN (GLUCOPHAGE) 500 MG  tablet Take 1,000 mg by mouth 2 (two) times daily. Pt takes one tablet in the morning and two at night.    . metoprolol tartrate (LOPRESSOR) 25 MG tablet Take 1 tablet (25 mg total) by mouth 2 (two) times daily. 60 tablet 3  . Milk Thistle 1000 MG CAPS Take 1 capsule by mouth daily.    [place] Kitchen OVER THE COUNTER MEDICATION Take 1 tablet by mouth daily. Primal defense probiotic tablet    . oxyCODONE 10 MG TABS Take 0.5-1 tablets (5-10 mg total) by mouth every 4 (four) hours as needed for severe pain. 30 tablet 0  . pantoprazole (PROTONIX) 40 MG tablet Take 1 tablet (40 mg total) by mouth 2 (two) times daily. 60 tablet 0  . rosuvastatin (CRESTOR) 40 MG tablet Take 1 tablet (40 mg total) by mouth daily. (Patient taking differently: Take 20 mg by mouth daily. ) 90 tablet 3  . Turmeric (QC TUMERIC COMPLEX PO) Take 1 capsule by mouth daily.    . vitamin C (ASCORBIC ACID) 500 MG tablet Take 1,000 mg by mouth 2 (two) times daily.     No current facility-administered medications for this visit.      Past Medical History:  Diagnosis Date  . Adenomatous polyp   . Chickenpox   . 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 Diverticulitis   . Erythrocytosis 11/25/2018  . Hepatitis A  as a child, no current liver problems  . Hyperlipidemia   . Hypertension    he also sees Dr. Lynita Lombard in [place], [place]   . Lower GI bleed    Hx: of  . Myocardial infarction ([place])   . Nephrolithiasis    2-3 kidney stones in past  . Obesity   . Pneumonia   . Prediabetes   . Sleep apnea 2003   CPAP machine, pt does not know settings    Past Surgical History:  Procedure Laterality Date  . APPENDECTOMY   10-59yrs ago  . BIOPSY  11/10/2018   Procedure: BIOPSY;  Surgeon: Lavena Bullion, DO;  Location: [facility];  Service: Gastroenterology;;  . CLOSED REDUCTION FINGER WITH PERCUTANEOUS PINNING Left 04/22/2013   Procedure: LEFT WRIST CLOSED REDUCTION CONVERTED TO OPEN REDUCTION INTERNAL FIXATION, LEFT CARPAL TUNNEL  RELEASE;  Surgeon: Linna Hoff, MD;  Location: [facility];  Service: Orthopedics;  Laterality: Left;  . COLONOSCOPY  07-09-09   benign polyp repeat 5 years, Dr.Sam Penelope Coop  . CORONARY ARTERY BYPASS GRAFT N/A 10/22/2018   Procedure: CORONARY ARTERY BYPASS GRAFTING (CABG) times four using right and left internal mammary arteries, left radial artery, and right greater saphenous vein harvested endoscopically.;  Surgeon: Wonda Olds, MD;  Location: [facility];  Service: Open Heart Surgery;  Laterality: N/A;  . CORONARY ARTERY BYPASS GRAFT    . CORONARY/GRAFT ACUTE MI REVASCULARIZATION N/A 10/21/2018   Procedure: Coronary/Graft Acute MI Revascularization;  Surgeon: Belva Crome, MD;  Location: [facility];  Service: Cardiovascular;  Laterality: N/A;  . ESOPHAGOGASTRODUODENOSCOPY (EGD) WITH PROPOFOL N/A 11/10/2018   Procedure: ESOPHAGOGASTRODUODENOSCOPY (EGD) WITH PROPOFOL;  Surgeon: Lavena Bullion, DO;  Location: [facility];  Service: Gastroenterology;  Laterality: N/A;  . HERNIA REPAIR     repaired x 4  . INSERTION OF MESH  04/09/2012   Procedure: INSERTION OF MESH;  Surgeon: Earnstine Regal, MD;  Location: [facility];  Service: General;  Laterality: N/A;  . LEFT HEART CATH AND CORONARY ANGIOGRAPHY N/A 10/21/2018   Procedure: LEFT HEART CATH AND CORONARY ANGIOGRAPHY;  Surgeon: Belva Crome, MD;  Location: [facility];  Service: Cardiovascular;  Laterality: N/A;  . RADIAL ARTERY HARVEST Left 10/22/2018   Procedure: LEFT RADIAL ARTERY HARVEST;  Surgeon: Wonda Olds, MD;  Location: [facility];  Service: Open Heart Surgery;  Laterality: Left;  . TEE WITHOUT CARDIOVERSION N/A 10/22/2018   Procedure: TRANSESOPHAGEAL ECHOCARDIOGRAM (TEE);  Surgeon: Wonda Olds, MD;  Location: [facility];  Service: Open Heart Surgery;  Laterality: N/A;  . TONSILLECTOMY  as child  . UVULECTOMY  yrs ago   for snoring  . VENTRAL HERNIA REPAIR  04/09/2012   Procedure: LAPAROSCOPIC VENTRAL HERNIA;  Surgeon: Earnstine Regal, MD;  Location: [facility];  Service: General;  Laterality: N/A;  Laparoscopic Ventral Incisional Hernia Repair with Mesh    Social History   Socioeconomic History  . Marital status: Married    Spouse name: Baxter Flattery  . Number of children: 4  . Years of education: 16  . Highest education level: Not on file  Occupational History  . Occupation: Self employed: Owns Programmer, multimedia: [employer] [place]  Social Needs  . Financial resource strain: Not on file  . Food insecurity    Worry: Not on file    Inability: Not on file  . Transportation needs    Medical: Not on file    Non-medical: Not  on file  Tobacco Use  . Smoking status: Never Smoker  . Smokeless tobacco: Never Used  Substance and Sexual Activity  . Alcohol use: Yes    Alcohol/week: 0.0 standard drinks    Comment: 2-3 glasses 2-3 times a week-wine; occasional liquior drinks  . Drug use: No  . Sexual activity: Never  Lifestyle  . Physical activity    Days per week: Not on file    Minutes per session: Not on file  . Stress: Not on file  Relationships  . Social Herbalist on phone: Not on file    Gets together: Not on file    Attends religious service: Not on file    Active member of club or organization: Not on file    Attends meetings of clubs or organizations: Not on file    Relationship status: Not on file  . Intimate partner violence    Fear of current or ex partner: Not on file    Emotionally abused: Not on file    Physically abused: Not on file    Forced sexual activity: Not on file  Other Topics Concern  . Not on file  Social History Narrative   Married.  Lives with wife.  Independent of ADLs.   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 Stroke Mother        late 25s  . CAD Mother        MI in her 28s  . CAD Father 31  . Marfan syndrome Brother   . Diabetes Brother   . Hypertension Brother   . Obesity Brother   . Colon  cancer Neg Hx     ROS: no fevers or chills, productive cough, hemoptysis, dysphasia, odynophagia, melena, hematochezia, dysuria, hematuria, rash, seizure activity, orthopnea, PND, pedal edema, claudication. Remaining systems are negative.  Physical Exam: Well-developed well-nourished in no acute distress.  Skin is warm and dry.  HEENT is normal.  Neck is supple.  Chest is clear to auscultation with normal expansion.  Cardiovascular exam is regular rate and rhythm.  Abdominal exam nontender or distended. No masses palpated. Extremities show no edema. neuro grossly intact  ECG- personally reviewed  A/P  1 coronary artery disease status post coronary artery bypass and graft-patient is improving following recent surgery and complication of GI bleed.  Continue aspirin and statin.  We will not resume apixaban.  2 postoperative atrial fibrillation-he remains in sinus rhythm on examination today.  We will not resume apixaban given recent GI bleed and history of GI bleeding previously.  3 hyperlipidemia-continue statin.  4 hypertension-patient's blood pressure is controlled.  Continue present medications and follow.  Kirk Ruths, MD

## 2018-12-08 ENCOUNTER — Ambulatory Visit: Payer: No Typology Code available for payment source | Admitting: Cardiology

## 2018-12-10 ENCOUNTER — Other Ambulatory Visit: Payer: Self-pay

## 2018-12-10 ENCOUNTER — Telehealth: Payer: Self-pay | Admitting: Internal Medicine

## 2018-12-10 ENCOUNTER — Inpatient Hospital Stay: Payer: No Typology Code available for payment source | Attending: Oncology

## 2018-12-10 DIAGNOSIS — R0601 Orthopnea: Secondary | ICD-10-CM | POA: Diagnosis not present

## 2018-12-10 DIAGNOSIS — Z79899 Other long term (current) drug therapy: Secondary | ICD-10-CM | POA: Insufficient documentation

## 2018-12-10 DIAGNOSIS — I10 Essential (primary) hypertension: Secondary | ICD-10-CM | POA: Insufficient documentation

## 2018-12-10 DIAGNOSIS — I252 Old myocardial infarction: Secondary | ICD-10-CM | POA: Insufficient documentation

## 2018-12-10 DIAGNOSIS — I2510 Atherosclerotic heart disease of native coronary artery without angina pectoris: Secondary | ICD-10-CM | POA: Diagnosis not present

## 2018-12-10 DIAGNOSIS — D649 Anemia, unspecified: Secondary | ICD-10-CM | POA: Diagnosis not present

## 2018-12-10 DIAGNOSIS — E785 Hyperlipidemia, unspecified: Secondary | ICD-10-CM | POA: Diagnosis not present

## 2018-12-10 DIAGNOSIS — E1169 Type 2 diabetes mellitus with other specified complication: Secondary | ICD-10-CM | POA: Diagnosis not present

## 2018-12-10 DIAGNOSIS — Z8269 Family history of other diseases of the musculoskeletal system and connective tissue: Secondary | ICD-10-CM | POA: Diagnosis not present

## 2018-12-10 DIAGNOSIS — G4730 Sleep apnea, unspecified: Secondary | ICD-10-CM | POA: Insufficient documentation

## 2018-12-10 DIAGNOSIS — Z8249 Family history of ischemic heart disease and other diseases of the circulatory system: Secondary | ICD-10-CM | POA: Insufficient documentation

## 2018-12-10 DIAGNOSIS — K5790 Diverticulosis of intestine, part unspecified, without perforation or abscess without bleeding: Secondary | ICD-10-CM | POA: Diagnosis not present

## 2018-12-10 DIAGNOSIS — D751 Secondary polycythemia: Secondary | ICD-10-CM

## 2018-12-10 DIAGNOSIS — Z833 Family history of diabetes mellitus: Secondary | ICD-10-CM | POA: Insufficient documentation

## 2018-12-10 DIAGNOSIS — Z811 Family history of alcohol abuse and dependence: Secondary | ICD-10-CM | POA: Insufficient documentation

## 2018-12-10 DIAGNOSIS — Z823 Family history of stroke: Secondary | ICD-10-CM | POA: Diagnosis not present

## 2018-12-10 DIAGNOSIS — R0902 Hypoxemia: Secondary | ICD-10-CM | POA: Diagnosis not present

## 2018-12-10 DIAGNOSIS — J90 Pleural effusion, not elsewhere classified: Secondary | ICD-10-CM | POA: Diagnosis not present

## 2018-12-10 DIAGNOSIS — K260 Acute duodenal ulcer with hemorrhage: Secondary | ICD-10-CM | POA: Insufficient documentation

## 2018-12-10 LAB — RETICULOCYTES
Immature Retic Fract: 20.8 % — ABNORMAL HIGH (ref 2.3–15.9)
RBC.: 3.86 MIL/uL — ABNORMAL LOW (ref 4.22–5.81)
Retic Count, Absolute: 103.1 10*3/uL (ref 19.0–186.0)
Retic Ct Pct: 2.7 % (ref 0.4–3.1)

## 2018-12-10 LAB — SAVE SMEAR(SSMR), FOR PROVIDER SLIDE REVIEW

## 2018-12-10 LAB — CBC WITH DIFFERENTIAL/PLATELET
Abs Immature Granulocytes: 0.03 10*3/uL (ref 0.00–0.07)
Basophils Absolute: 0.1 10*3/uL (ref 0.0–0.1)
Basophils Relative: 1 %
Eosinophils Absolute: 0.3 10*3/uL (ref 0.0–0.5)
Eosinophils Relative: 3 %
HCT: 31.8 % — ABNORMAL LOW (ref 39.0–52.0)
Hemoglobin: 9.5 g/dL — ABNORMAL LOW (ref 13.0–17.0)
Immature Granulocytes: 0 %
Lymphocytes Relative: 7 %
Lymphs Abs: 0.7 10*3/uL (ref 0.7–4.0)
MCH: 24.7 pg — ABNORMAL LOW (ref 26.0–34.0)
MCHC: 29.9 g/dL — ABNORMAL LOW (ref 30.0–36.0)
MCV: 82.6 fL (ref 80.0–100.0)
Monocytes Absolute: 0.9 10*3/uL (ref 0.1–1.0)
Monocytes Relative: 9 %
Neutro Abs: 7.6 10*3/uL (ref 1.7–7.7)
Neutrophils Relative %: 80 %
Platelets: 554 10*3/uL — ABNORMAL HIGH (ref 150–400)
RBC: 3.85 MIL/uL — ABNORMAL LOW (ref 4.22–5.81)
RDW: 19.7 % — ABNORMAL HIGH (ref 11.5–15.5)
WBC: 9.6 10*3/uL (ref 4.0–10.5)
nRBC: 0 % (ref 0.0–0.2)

## 2018-12-10 LAB — IRON AND TIBC
Iron: 20 ug/dL — ABNORMAL LOW (ref 42–163)
Saturation Ratios: 5 % — ABNORMAL LOW (ref 20–55)
TIBC: 370 ug/dL (ref 202–409)
UIBC: 350 ug/dL (ref 117–376)

## 2018-12-10 LAB — FERRITIN: Ferritin: 155 ng/mL (ref 24–336)

## 2018-12-10 NOTE — Telephone Encounter (Signed)
Had blood tests at hematology today and asked if still needs labs next week - I told him no  Looks iron deficient by iron sat # but ferritin increased - suspect acute phase reactant advised start ferrous sulfate 325 mg qd   Hgb 9.5, last was 9.8  MCV deckined to 82

## 2018-12-11 LAB — ERYTHROPOIETIN: Erythropoietin: 321.5 m[IU]/mL — ABNORMAL HIGH (ref 2.6–18.5)

## 2018-12-16 NOTE — Progress Notes (Addendum)
[facility]  [phone] [phone]     ID: Thedore Mins DOB: Dec 03, 1960  MR#: [id number]  [id number]  Patient Care Team: System, Pcp Not In as PCP - General Stanford Breed Denice Bors, MD as PCP - Cardiology (Cardiology) Gatha Mayer, MD as Consulting Physician (Gastroenterology) Kristain Filo, Virgie Dad, MD as Consulting Physician (Oncology) Wonda Olds, MD as Consulting Physician (Cardiothoracic Surgery) Mikle Bosworth, MD as Consulting Physician (Cardiology) Chauncey Cruel, MD OTHER MD: Naida Sleight, MD  CHIEF COMPLAINT: erythrocytosis  CURRENT TREATMENT: Observation   HISTORY OF CURRENT ILLNESS: Nazeer Tinker was referred by Dr. Carlean Purl for evaluation of a repeatedly but not consistently elevated hemoglobin and hematocrit  We have values going back many years.  These show a few hemoglobin values over 18,, a few over 17, and slightly more common ones over 16, interspersed with many normal hemoglobin values and more recently with frank anemia (complete blood count obtained here on 12/10/2018 showed a hemoglobin of 9.5 with hematocrit of 31.8).  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  He is currently on low-dose aspirin for his history of significant coronary artery disease.   Results for TOAN, MUSKA (MRN [id number]) as of 12/17/2018 11:15  Ref. Range 06/18/2011 08:30 04/06/2012 09:30 04/09/2012 15:50 04/17/2012 11:20 04/18/2012 05:16 12/26/2012 04:40 12/26/2012 10:26 12/26/2012 15:29 12/26/2012 15:34 12/26/2012 21:14 12/27/2012 03:20 12/27/2012 08:26 12/28/2012 03:59 04/22/2013 14:44 11/01/2015 17:41 10/03/2016 14:33 10/21/2018 00:21 10/21/2018 01:13 10/21/2018 03:54 10/22/2018 02:46 10/22/2018 07:54 10/22/2018 10:50 10/22/2018 11:16 10/22/2018 11:22 10/22/2018 12:16 10/22/2018 12:47 10/22/2018 13:19 10/22/2018 13:22 10/22/2018 13:59 10/22/2018 15:19 10/22/2018 15:20 10/22/2018 15:36  10/22/2018 16:29 10/22/2018 18:20 10/22/2018 19:22 10/22/2018 21:12 10/23/2018 04:08 10/23/2018 14:15 10/24/2018 04:36 10/26/2018 03:11 11/08/2018 20:22 11/09/2018 11:00 11/09/2018 16:51 11/10/2018 05:53 11/10/2018 09:53 11/10/2018 16:49 11/10/2018 22:14 11/11/2018 04:38 11/16/2018 09:45 11/26/2018 10:22 12/10/2018 09:46  Hemoglobin Latest Ref Range: 13.0 - 17.0 g/dL 15.7 17.1 (H) 16.2 16.9 14.9 15.7 14.4 13.1 13.2 11.4 (L) 10.8 (L) 10.3 (L) 8.9 (L) 17.7 (H) 16.3 14.7 18.7 (H) 18.4 (H) 17.6 (H) 16.4 16.3 15.6 12.9 (L) 13.3 12.6 (L) 11.0 (L) 10.9 (L) 11.2 (L) 13.3 13.6 13.6 13.3 13.6 13.3 13.9 13.7 12.2 (L) 11.7 (L) 11.5 (L) 9.6 (L) 4.7 (LL) 7.5 (L) 7.2 (L) 8.3 (L) 8.1 (L) 8.2 (L) 8.4 (L) 8.2 (L) 9.1 (L) 9.8 (L) 9.5 (L)  HCT Latest Ref Range: 39.0 - 52.0 % 46.9 48.4 47.2 48.6 44.8 45.7 41.8 38.2 (L) 39.1 33.7 (L) 31.7 (L) 29.9 (L) 25.9 (L) 50.6 47.6 44.2 54.3 (H) 54.0 (H) 52.6 (H) 48.4 48.0 46.0 38.0 (L) 39.0 37.0 (L) 33.3 (L) 32.0 (L) 33.0 (L) 39.0 40.0 41.3 39.0 40.0 39.0 41.0 40.7 37.6 (L) 35.3 (L) 35.7 (L) 29.3 (L) 15.9 (L) 23.2 (L) 21.9 (L) 25.0 (L) 25.0 (L) 25.3 (L) 25.7 (L) 26.2 (L) 27.8 (L) 30.5 (L) 31.8 (L)   The patient's subsequent history is as detailed below.   INTERVAL HISTORY: Dequon was evaluated in the hematology clinic on 12/17/2018 for evaluation of erythrocytosis   REVIEW OF SYSTEMS: Esco is very concerned because he was told repeatedly that he was fine until he nearly died in the emergency room and then required emergent cardiac bypass surgery.  He sees himself as possibly dying in the next few years if "things cannot be turned around".  He is not depressed about this, but he is looking for answers meaning something he can do to reverse  what ever the process is that led to his heart attack.  He is now doing much better as far as the diverticular bleeding and GI bleeding issues.  He is exercising well.  He tells me he has an excellent diet.  He and his wife hired a Biomedical scientist to prepare their meals.  He assures me  that he has less body fat than I do.  In addition to working with his cardiologist and GI doctor, he is working with Dr. Naida Sleight out of [place] who is an expert on longevity.  Currently Ngoc denies chest pain or pressure, shortness of breath, cough, pleurisy, or change in bowel or bladder habits.  He tells me that he tolerates his CPAP well and always uses it every night.  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 Lynita Lombard in [place]    Lower GI bleed    Hx: of   Myocardial infarction [facility])    Nephrolithiasis    2-3 kidney stones in past   Obesity    Pneumonia    Prediabetes    Schatzki's ring of distal esophagus    Sleep apnea 2003   CPAP machine, pt does not know settings    PAST SURGICAL HISTORY: Past Surgical History:  Procedure Laterality Date   APPENDECTOMY   10-80yrs ago   BIOPSY  11/10/2018   Procedure: BIOPSY;  Surgeon: Lavena Bullion, DO;  Location: [facility];  Service: Gastroenterology;;   CLOSED REDUCTION FINGER WITH PERCUTANEOUS PINNING Left 04/22/2013   Procedure: LEFT WRIST CLOSED REDUCTION CONVERTED TO OPEN REDUCTION INTERNAL FIXATION, LEFT CARPAL TUNNEL RELEASE;  Surgeon: Linna Hoff, MD;  Location: [facility];  Service: Orthopedics;  Laterality: Left;   COLONOSCOPY  07-09-09   benign polyp repeat 5 years, Dr.Sam Ganem   CORONARY ARTERY BYPASS GRAFT N/A 10/22/2018   Procedure: CORONARY ARTERY BYPASS GRAFTING (CABG) times four using right and left internal mammary arteries, left radial artery, and right greater saphenous vein harvested  endoscopically.;  Surgeon: Wonda Olds, MD;  Location: [facility];  Service: Open Heart Surgery;  Laterality: N/A;   CORONARY ARTERY BYPASS GRAFT     CORONARY/GRAFT ACUTE MI REVASCULARIZATION N/A 10/21/2018   Procedure: Coronary/Graft Acute MI Revascularization;  Surgeon: Belva Crome, MD;  Location: [facility];  Service: Cardiovascular;  Laterality: N/A;   ESOPHAGOGASTRODUODENOSCOPY (EGD) WITH PROPOFOL N/A 11/10/2018   Procedure: ESOPHAGOGASTRODUODENOSCOPY (EGD) WITH PROPOFOL;  Surgeon: Lavena Bullion, DO;  Location: [facility];  Service: Gastroenterology;  Laterality: N/A;   HERNIA REPAIR     repaired x 4   INSERTION OF MESH  04/09/2012   Procedure: INSERTION OF MESH;  Surgeon: Earnstine Regal, MD;  Location: [facility];  Service: General;  Laterality: N/A;   LEFT HEART CATH AND CORONARY ANGIOGRAPHY N/A 10/21/2018   Procedure: LEFT HEART CATH AND CORONARY ANGIOGRAPHY;  Surgeon: Belva Crome, MD;  Location: [facility];  Service: Cardiovascular;  Laterality: N/A;   RADIAL ARTERY HARVEST Left 10/22/2018   Procedure: LEFT RADIAL  ARTERY HARVEST;  Surgeon: Wonda Olds, MD;  Location: [facility];  Service: Open Heart Surgery;  Laterality: Left;   TEE WITHOUT CARDIOVERSION N/A 10/22/2018   Procedure: TRANSESOPHAGEAL ECHOCARDIOGRAM (TEE);  Surgeon: Wonda Olds, MD;  Location: [facility];  Service: Open Heart Surgery;  Laterality: N/A;   TONSILLECTOMY  as child   UVULECTOMY  yrs ago   for snoring   VENTRAL HERNIA REPAIR  04/09/2012   Procedure: LAPAROSCOPIC VENTRAL HERNIA;  Surgeon: Earnstine Regal, MD;  Location: [facility];  Service: General;  Laterality: N/A;  Laparoscopic Ventral Incisional Hernia Repair with Mesh    FAMILY HISTORY: Family History  Problem Relation Age of Onset   Stroke Mother        late 37s   CAD Mother        MI in her 32s   CAD Father 50   Marfan syndrome Brother    Diabetes Brother    Hypertension Brother    Obesity Brother    Colon  cancer Neg Hx    The patient's father died at the age of 19 in the setting of alcoholism.  He may or may not have had a heart attack.  The patient's mother however did have a heart attack in her late 39s and a stroke in her late 76s.  She died in her mid 2s, being paralyzed about 12 years.  The patient tells me his mother was not a smoker but also never exercised.  The patient's mother had one sister who lived into her 82s, and her mother, the patient's maternal grandmother lived to be 27.  The maternal grandfather died from lung cancer in the setting of tobacco abuse.  Devonte himself has 2 brothers.  1 of them died from complications of Marfan syndrome at the age of 63.  The other brother died from bleeding after a gastric bypass surgery.   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  His wife Baxter Flattery runs her own business, which is an Engineer, building services.  Their children are 84, 27, and 108 years old.  The 58 year old, Darnelle Maffucci, just came out of the [employer] and is working in Engineer, technical sales in [place].  The patient has no grandchildren.  He does have one surviving stepchild from a prior marriage but "we have no relations".  Mondell attends [employer]. [employer]    ADVANCED DIRECTIVES: In the absence of any documents to the contrary the patient's wife is his healthcare power of attorney   HEALTH MAINTENANCE: Social History   Tobacco Use   Smoking status: Never Smoker   Smokeless tobacco: Never Used  Substance Use Topics   Alcohol use: Yes    Alcohol/week: 0.0 standard drinks    Comment: 2-3 glasses 2-3 times a week-wine; occasional liquior drinks   Drug use: No     Colonoscopy: February 2018  PSA:  Cholesterol:   Allergies  Allergen Reactions   Acetaminophen Other (See Comments)    Bleeding issues   Ibuprofen Other (See Comments)    Bleeding issues   Advair Diskus [Fluticasone-Salmeterol] Cough   Latex Itching    Current  Outpatient Medications  Medication Sig Dispense Refill   acidophilus (RISAQUAD) CAPS capsule Take 1 capsule by mouth 2 (two) times daily.     ALPRAZolam (XANAX) 1 MG tablet Take 1 mg by mouth at bedtime as needed for sleep.     aspirin EC 81 MG tablet Take 1 tablet (81 mg total) by mouth  daily.     Cholecalciferol (DIALYVITE VITAMIN D 5000 PO) Take 1 tablet by mouth daily.     DHEA 50 MG CAPS Take 50 mg by mouth daily.     ezetimibe (ZETIA) 10 MG tablet Take 10 mg by mouth daily.     furosemide (LASIX) 20 MG tablet Take 20 mg by mouth as needed.     metFORMIN (GLUCOPHAGE) 500 MG tablet Take 1,000 mg by mouth 2 (two) times daily. Pt takes one tablet in the morning and two at night.     metoprolol tartrate (LOPRESSOR) 25 MG tablet Take 1 tablet (25 mg total) by mouth 2 (two) times daily. 60 tablet 3   Milk Thistle 1000 MG CAPS Take 1 capsule by mouth daily.     OVER THE COUNTER MEDICATION Take 1 tablet by mouth daily. Primal defense probiotic tablet     pantoprazole (PROTONIX) 40 MG tablet Take 1 tablet (40 mg total) by mouth daily before breakfast. 90 tablet 3   rosuvastatin (CRESTOR) 40 MG tablet Take 1 tablet (40 mg total) by mouth daily. (Patient taking differently: Take 20 mg by mouth daily. ) 90 tablet 3   vitamin B-12 (CYANOCOBALAMIN) 100 MCG tablet Take 100 mcg by mouth daily.     No current facility-administered medications for this visit.     OBJECTIVE: Middle-aged white man in no acute distress  Vitals:   12/17/18 1557  BP: (!) 161/88  Pulse: 91  Resp: 18  Temp: 99.1 F (37.3 C)  SpO2: 100%     Body mass index is 33.07 kg/m.   Wt Readings from Last 3 Encounters:  12/17/18 257 lb 9.6 oz (116.8 kg)  12/17/18 250 lb 3.2 oz (113.5 kg)  11/20/18 253 lb (114.8 kg)      ECOG FS:1 - Symptomatic but completely ambulatory  Sclerae unicteric, EOMs intact Wearing a mask No cervical or supraclavicular adenopathy Lungs no rales or rhonchi Heart regular rate and  rhythm Abd soft, obese, nontender, positive bowel sounds MSK no focal spinal tenderness Neuro: nonfocal, well oriented, appropriate affect   LAB RESULTS:  Hemoglobin A1c on 2 occasions (January 2014 and July 2020) was 6.0  CMP     Component Value Date/Time   NA 140 12/17/2018 1017   K 3.5 12/17/2018 1017   CL 103 12/17/2018 1017   CO2 31 12/17/2018 1017   GLUCOSE 99 12/17/2018 1017   BUN 15 12/17/2018 1017   CREATININE 1.04 12/17/2018 1017   CALCIUM 9.5 12/17/2018 1017   PROT 7.5 12/17/2018 1017   ALBUMIN 3.9 12/17/2018 1017   AST 14 12/17/2018 1017   ALT 9 12/17/2018 1017   ALKPHOS 80 12/17/2018 1017   BILITOT 0.3 12/17/2018 1017   GFRNONAA >60 11/11/2018 0738   GFRAA >60 11/11/2018 0738    No results found for: TOTALPROTELP, ALBUMINELP, A1GS, A2GS, BETS, BETA2SER, GAMS, MSPIKE, SPEI  No results found for: KPAFRELGTCHN, LAMBDASER, KAPLAMBRATIO  Lab Results  Component Value Date   WBC 5.2 12/17/2018   NEUTROABS 7.6 12/10/2018   HGB 15.0 12/17/2018   HCT 42.6 12/17/2018   MCV 91.1 12/17/2018   PLT 251.0 12/17/2018    @LASTCHEMISTRY @  No results found for: LABCA2  No components found for: [id number]  No results for input(s): INR in the last 168 hours.  No results found for: LABCA2  No results found for: [id number]  No results found for: [id number]  No results found for: [id number]  No results found for: CA2729  No components found for:  HGQUANT  No results found for: CEA1 / No results found for: CEA1   No results found for: AFPTUMOR  No results found for: CHROMOGRNA  No results found for: PSA1  Appointment on 12/17/2018  Component Date Value Ref Range Status   Pro B Natriuretic peptide (BNP) 12/17/2018 146.0* 0.0 - 100.0 pg/mL Final   Sodium 12/17/2018 140  135 - 145 mEq/L Final   Potassium 12/17/2018 3.5  3.5 - 5.1 mEq/L Final   Chloride 12/17/2018 103  96 - 112 mEq/L Final   CO2 12/17/2018 31  19 - 32 mEq/L Final   Glucose, Bld 12/17/2018 99  70  - 99 mg/dL Final   BUN 12/17/2018 15  6 - 23 mg/dL Final   Creatinine, Ser 12/17/2018 1.04  0.40 - 1.50 mg/dL Final   Total Bilirubin 12/17/2018 0.3  0.2 - 1.2 mg/dL Final   Alkaline Phosphatase 12/17/2018 80  39 - 117 U/L Final   AST 12/17/2018 14  0 - 37 U/L Final   ALT 12/17/2018 9  0 - 53 U/L Final   Total Protein 12/17/2018 7.5  6.0 - 8.3 g/dL Final   Albumin 12/17/2018 3.9  3.5 - 5.2 g/dL Final   Calcium 12/17/2018 9.5  8.4 - 10.5 mg/dL Final   GFR 12/17/2018 73.23  >60.00 mL/min Final   WBC 12/17/2018 5.2  4.0 - 10.5 K/uL Final   RBC 12/17/2018 4.67  4.22 - 5.81 Mil/uL Final   Platelets 12/17/2018 251.0  150.0 - 400.0 K/uL Final   Hemoglobin 12/17/2018 15.0  13.0 - 17.0 g/dL Final   HCT 12/17/2018 42.6  39.0 - 52.0 % Final   MCV 12/17/2018 91.1  78.0 - 100.0 fl Final   MCHC 12/17/2018 35.1  30.0 - 36.0 g/dL Final   RDW 12/17/2018 12.4  11.5 - 15.5 % Final    (this displays the last labs from the last 3 days)  No results found for: TOTALPROTELP, ALBUMINELP, A1GS, A2GS, BETS, BETA2SER, GAMS, MSPIKE, SPEI (this displays SPEP labs)  No results found for: KPAFRELGTCHN, LAMBDASER, KAPLAMBRATIO (kappa/lambda light chains)  No results found for: HGBA, HGBA2QUANT, HGBFQUANT, HGBSQUAN (Hemoglobinopathy evaluation)   No results found for: LDH  Lab Results  Component Value Date   IRON 20 (L) 12/10/2018   TIBC 370 12/10/2018   IRONPCTSAT 5 (L) 12/10/2018   (Iron and TIBC)  Lab Results  Component Value Date   FERRITIN 155 12/10/2018    Urinalysis    Component Value Date/Time   COLORURINE YELLOW 10/21/2018 1928   APPEARANCEUR CLEAR 10/21/2018 1928   LABSPEC 1.009 10/21/2018 1928   PHURINE 6.0 10/21/2018 1928   GLUCOSEU NEGATIVE 10/21/2018 1928   HGBUR SMALL (A) 10/21/2018 1928   BILIRUBINUR NEGATIVE 10/21/2018 1928   BILIRUBINUR n 06/18/2011 [place] 10/21/2018 1928   PROTEINUR NEGATIVE 10/21/2018 1928   UROBILINOGEN 0.2 06/18/2011  0907   NITRITE NEGATIVE 10/21/2018 1928   LEUKOCYTESUR NEGATIVE 10/21/2018 1928     STUDIES: Dg Chest 2 View  Result Date: 12/17/2018 CLINICAL DATA:  Dyspnea, orthopnea EXAM: CHEST - 2 VIEW COMPARISON:  11/17/2018 FINDINGS: Cardiomegaly status post median sternotomy and CABG. Unchanged small bilateral pleural effusions. The visualized skeletal structures are unremarkable. IMPRESSION: Cardiomegaly with unchanged small bilateral pleural effusions. No new airspace opacity. Electronically Signed   By: Eddie Candle M.D.   On: 12/17/2018 14:54    ELIGIBLE FOR AVAILABLE RESEARCH PROTOCOL: no  ASSESSMENT: 58 y.o. [place], [place] man with erythrocytosis, possibly related to relative hypoxia due  to sleep apnea, or to use of testosterone.  (1) P. Vera ruled out:  (a) epo level 321.5 on 12/10/2018 (concurrent Hb 9.5 after recent bleeding episode)  Mutation negative for JAK-2, MPL and CARL  (2) atherosclerosis with significant family history, pre-diabetes, Hx of HTN, Hx of elevated lipids  PLAN: I spent approximately 60 minutes face to face with Harout with more than 50% of that time spent in counseling and coordination of care. Specifically we reviewed the biology of the patient's diagnosis and the specifics of his situation.  We discussed that polycythemia vera is a myeloproliferative syndrome associated with significant clotting issues and possible malignant transformation.  The evidence we have so far does not suggest that he has this problem.  In general, the body determines the needed red cell mass by making more or less erythropoietin.  This is produced by the kidney.  In cases of hypoxia the kidneys increase erythropoietin production.  When there are "too many" red cells, then erythropoietin production is decreased.  In polycythemia vera the increase in red cell mass is due to a genetic defect, most commonly a mutation in JAK2 and less commonly in other mutations.  Accordingly in this situation  the level of erythropoietin is low.  A high erythropoietin level as is the case with Lakeem is not suggestive a diagnosis of polycythemia vera.  In addition, we checked him for JAK2 mutations (exons 12 and 14, inclusive of V617) and also for the less common mutations in MPL exon 10 and CALR exon 9.  Accordingly I feel polycythemia vera is excluded in this case.  Fahed does have erythrocytosis. He has sleep apnea and perhaps other reasons for mild hypoxia, which is a physiologic trigger for epo production. He has been on testosterone supplementation (the patient did not disclose this today-- this information is from subsequent review of a note by Dr Alycia Rossetti). Testosterone is known to increase red cell production and in fact is used therapeutically for that purpose.  As far as the patient's recent anemia is concerned, he has adequate iron stores with a ferritin of 155.  His low iron saturation is indicative of poor iron mobilization secondary to chronic illness.  He does have a reticulocyte count in excess of 100.  In brief then, Mr. Goldschmidt has erythrocytosis on a physiologic basis, namely hypoxia, possibly also from testosterone replacement therapy. This requires no intervention, specifically no anticoagulation or phlebotomy. Note he is on low-dose aspirin in any case for CAD prophylaxis.  I tried to reassure Clearance that his blood is not the problem; the real issue is atherosclerosis. He finds this hard to accept because he feels he has already optimized his diet and exercise program. He tells me his BMI or 33 is not correct ("when they measure my body fat it is very low already") and he only takes metformin for prevention, not pre-diabetes. He is interested in lifetime extension and is working with a [place] MD regarding that. I am surprised caloric restriction has not been suggested to him by that MD since that is one of the few areas with experimental validation.  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 I do not know of a genetic basis for this (possibly related to his family history of Marfan's) but will inquire of our counselors. He does not have any Sx/Sns suggestive of renal atherosclerosis as a possible cause of his erythrocytosis but perhaps an MRI would reassure  [place] in that regard and also r/o a renal tumor.  We left it we would follow his Hb/HCT monthly for the next 6 months and he requests this be done through Dr Celesta Aver office. I am not making a return appointment for [facility] here at this point but will contact him as appropriate as additional results come in.     Chauncey Cruel, MD   12/17/2018 4:51 PM Medical Oncology and Hematology [facility] [place], [place] [address] Tel. [phone]    Fax. [phone]   This document serves as a record of services personally performed by Lurline Del, MD. It was created on his behalf by Wilburn Mylar, a trained medical scribe. The creation of this record is based on the scribe's personal observations and the provider's statements to them.   I, Lurline Del MD, have reviewed the above documentation for accuracy and completeness, and I agree with the above.  ADDENDUM: entered order for pelvic MRI; genetics counselor is reviewing for possible genetic issue given picture above

## 2018-12-17 ENCOUNTER — Ambulatory Visit (INDEPENDENT_AMBULATORY_CARE_PROVIDER_SITE_OTHER)
Admission: RE | Admit: 2018-12-17 | Discharge: 2018-12-17 | Disposition: A | Discharge status: Home / Self Care | Payer: No Typology Code available for payment source | Source: Ambulatory Visit | Attending: Internal Medicine | Admitting: Internal Medicine

## 2018-12-17 ENCOUNTER — Other Ambulatory Visit: Payer: Self-pay

## 2018-12-17 ENCOUNTER — Ambulatory Visit (INDEPENDENT_AMBULATORY_CARE_PROVIDER_SITE_OTHER): Payer: No Typology Code available for payment source | Admitting: Internal Medicine

## 2018-12-17 ENCOUNTER — Inpatient Hospital Stay (HOSPITAL_BASED_OUTPATIENT_CLINIC_OR_DEPARTMENT_OTHER): Payer: No Typology Code available for payment source | Admitting: Oncology

## 2018-12-17 ENCOUNTER — Encounter: Payer: Self-pay | Admitting: Internal Medicine

## 2018-12-17 ENCOUNTER — Encounter: Payer: Self-pay | Admitting: Oncology

## 2018-12-17 ENCOUNTER — Other Ambulatory Visit (INDEPENDENT_AMBULATORY_CARE_PROVIDER_SITE_OTHER): Payer: No Typology Code available for payment source

## 2018-12-17 VITALS — BP 161/88 | HR 91 | Temp 99.1°F | Resp 18 | Ht 74.0 in | Wt 257.6 lb

## 2018-12-17 VITALS — BP 148/76 | Temp 98.1°F | Ht 74.0 in | Wt 250.2 lb

## 2018-12-17 DIAGNOSIS — G4733 Obstructive sleep apnea (adult) (pediatric): Secondary | ICD-10-CM

## 2018-12-17 DIAGNOSIS — Z8269 Family history of other diseases of the musculoskeletal system and connective tissue: Secondary | ICD-10-CM

## 2018-12-17 DIAGNOSIS — Z811 Family history of alcohol abuse and dependence: Secondary | ICD-10-CM

## 2018-12-17 DIAGNOSIS — D62 Acute posthemorrhagic anemia: Secondary | ICD-10-CM

## 2018-12-17 DIAGNOSIS — R0601 Orthopnea: Secondary | ICD-10-CM

## 2018-12-17 DIAGNOSIS — D751 Secondary polycythemia: Secondary | ICD-10-CM

## 2018-12-17 DIAGNOSIS — J90 Pleural effusion, not elsewhere classified: Secondary | ICD-10-CM

## 2018-12-17 DIAGNOSIS — E1169 Type 2 diabetes mellitus with other specified complication: Secondary | ICD-10-CM

## 2018-12-17 DIAGNOSIS — D649 Anemia, unspecified: Secondary | ICD-10-CM

## 2018-12-17 DIAGNOSIS — E785 Hyperlipidemia, unspecified: Secondary | ICD-10-CM

## 2018-12-17 DIAGNOSIS — D750 Familial erythrocytosis: Secondary | ICD-10-CM

## 2018-12-17 DIAGNOSIS — K620 Anal polyp: Secondary | ICD-10-CM

## 2018-12-17 DIAGNOSIS — Z8249 Family history of ischemic heart disease and other diseases of the circulatory system: Secondary | ICD-10-CM

## 2018-12-17 DIAGNOSIS — Z833 Family history of diabetes mellitus: Secondary | ICD-10-CM

## 2018-12-17 DIAGNOSIS — Z79899 Other long term (current) drug therapy: Secondary | ICD-10-CM

## 2018-12-17 DIAGNOSIS — Z823 Family history of stroke: Secondary | ICD-10-CM

## 2018-12-17 DIAGNOSIS — G4730 Sleep apnea, unspecified: Secondary | ICD-10-CM

## 2018-12-17 DIAGNOSIS — I252 Old myocardial infarction: Secondary | ICD-10-CM

## 2018-12-17 DIAGNOSIS — I2510 Atherosclerotic heart disease of native coronary artery without angina pectoris: Secondary | ICD-10-CM

## 2018-12-17 DIAGNOSIS — R6889 Other general symptoms and signs: Secondary | ICD-10-CM | POA: Diagnosis not present

## 2018-12-17 DIAGNOSIS — Z955 Presence of coronary angioplasty implant and graft: Secondary | ICD-10-CM

## 2018-12-17 DIAGNOSIS — I10 Essential (primary) hypertension: Secondary | ICD-10-CM

## 2018-12-17 DIAGNOSIS — K260 Acute duodenal ulcer with hemorrhage: Secondary | ICD-10-CM | POA: Diagnosis not present

## 2018-12-17 DIAGNOSIS — K5790 Diverticulosis of intestine, part unspecified, without perforation or abscess without bleeding: Secondary | ICD-10-CM

## 2018-12-17 DIAGNOSIS — R0902 Hypoxemia: Secondary | ICD-10-CM

## 2018-12-17 DIAGNOSIS — E669 Obesity, unspecified: Secondary | ICD-10-CM

## 2018-12-17 DIAGNOSIS — J4521 Mild intermittent asthma with (acute) exacerbation: Secondary | ICD-10-CM

## 2018-12-17 LAB — COMPREHENSIVE METABOLIC PANEL
ALT: 9 U/L (ref 0–53)
AST: 14 U/L (ref 0–37)
Albumin: 3.9 g/dL (ref 3.5–5.2)
Alkaline Phosphatase: 80 U/L (ref 39–117)
BUN: 15 mg/dL (ref 6–23)
CO2: 31 mEq/L (ref 19–32)
Calcium: 9.5 mg/dL (ref 8.4–10.5)
Chloride: 103 mEq/L (ref 96–112)
Creatinine, Ser: 1.04 mg/dL (ref 0.40–1.50)
GFR: 73.23 mL/min (ref >60.00)
Glucose, Bld: 99 mg/dL (ref 70–99)
Potassium: 3.5 mEq/L (ref 3.5–5.1)
Sodium: 140 mEq/L (ref 135–145)
Total Bilirubin: 0.3 mg/dL (ref 0.2–1.2)
Total Protein: 7.5 g/dL (ref 6.0–8.3)

## 2018-12-17 LAB — CBC
HCT: 42.6 % (ref 39.0–52.0)
Hemoglobin: 15 g/dL (ref 13.0–17.0)
MCHC: 35.1 g/dL (ref 30.0–36.0)
MCV: 91.1 fl (ref 78.0–100.0)
Platelets: 251 10*3/uL (ref 150.0–400.0)
RBC: 4.67 Mil/uL (ref 4.22–5.81)
RDW: 12.4 % (ref 11.5–15.5)
WBC: 5.2 10*3/uL (ref 4.0–10.5)

## 2018-12-17 LAB — BRAIN NATRIURETIC PEPTIDE: Pro B Natriuretic peptide (BNP): 146 pg/mL — ABNORMAL HIGH (ref 0.0–100.0)

## 2018-12-17 MED ORDER — PANTOPRAZOLE SODIUM 40 MG PO TBEC: 40.0000 mg | DELAYED_RELEASE_TABLET | Freq: Every day | ORAL | 3 refills | Status: DC

## 2018-12-17 NOTE — Assessment & Plan Note (Signed)
I think he can reduce his pantoprazole to once a day.  I am not convinced we need to re-scope him though it might be reasonable given what he has been through to make sure it is healed.  Not ready to set that up now.

## 2018-12-17 NOTE — Assessment & Plan Note (Signed)
Suspect related to testosterone.  Not an issue now but could be in the future.  Await hematology input.

## 2018-12-17 NOTE — Assessment & Plan Note (Signed)
Chest x-ray, BNP electrolytes kidney function CBC.  May need to go back to cardiologist.  Question if he has some sort of post CABG syndrome.  Seems unlikely but something is not right.  His abdominal exam shows protuberance and obesity, I did not detect obvious ascites keep that in mind.  He has no edema and his lungs are clear.

## 2018-12-17 NOTE — Progress Notes (Signed)
Oscar [name] 58 y.o. 1960/09/10 [id number]  Assessment & Plan:   Acute duodenal ulcer with hemorrhage I think he can reduce his pantoprazole to once a day.  I am not convinced we need to re-scope him though it might be reasonable given what he has been through to make sure it is healed.  Not ready to set that up now.  Acute blood loss anemia Certainly contributed to his current anemia, iron studies are a bit conflicting but I wonder if the ferritin of 155 is not an acute phase reactant as his iron saturation is low and his TIBC is high normal.  Continue iron therapy recheck CBC ferritin today.  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  Not an issue now but could be in the future.  Await hematology input.  History of ST elevation myocardial infarction (STEMI) He had a decent EF with only mild inferior wall motion abnormalities at cardiac cath.  Question if he has a post CABG syndrome.  I did not hear any rub or anything to suggest pericarditis.  Checking a BNP and CMET.  Anemia could be contributing to some of his problems.  Orthopnea Chest x-ray, BNP electrolytes kidney function CBC.  May need to go back to cardiologist.  Question if he has some sort of post CABG syndrome.  Seems unlikely but something is not right.  His abdominal exam shows protuberance and obesity, I did not detect obvious ascites keep that in mind.  He has no edema and his lungs are clear.  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 See orthopnea and status post STEMI  I appreciate the opportunity to care for this patient. CC: Dr. Sarajane Jews [name] Dr. Venetia [name] Dr. Fredrich [name]  CBC Latest Ref Rng & Units 12/17/2018 12/10/2018 11/26/2018  WBC 4.0 - 10.5 K/uL 5.2 9.6 8.7  Hemoglobin 13.0 - 17.0 g/dL 15.0 9.5(L) 9.8(L)  Hematocrit 39.0 - 52.0 % 42.6 31.8(L) 30.5(L)  Platelets 150.0 - 400.0 K/uL 251.0 554(H) 498.0(H)    Lab Results  Component Value Date   CREATININE 1.04  12/17/2018   BUN 15 12/17/2018   NA 140 12/17/2018   K 3.5 12/17/2018   CL 103 12/17/2018   CO2 31 12/17/2018   Lab Results  Component Value Date   ALT 9 12/17/2018   AST 14 12/17/2018   ALKPHOS 80 12/17/2018   BILITOT 0.3 12/17/2018    BNP 146 - mildly elevated   DG Chest 2 View CLINICAL DATA:  Dyspnea, orthopnea  EXAM: CHEST - 2 VIEW  COMPARISON:  11/17/2018  FINDINGS: Cardiomegaly status post median sternotomy and CABG. Unchanged small bilateral pleural effusions. The visualized skeletal structures are unremarkable.  IMPRESSION: Cardiomegaly with unchanged small bilateral pleural effusions. No new airspace opacity.  Electronically Signed   By: Oscar [name] M.D.   On: 12/17/2018 14:54    Thinking about this a bit more with weight changes - ? If he could have ascites - would be more of an orthopnea issue.  Will have him get an [place]  Oscar Mayer, MD, [facility]   Subjective:   Chief Complaint: Follow-up anemia, bleeding duodenal ulcer  HPI Oscar [name] is here for follow-up after he had a bleeding duodenal ulcer status post coronary artery bypass grafting.  EGD 11/10/2018 showed a duodenal ulcer, clean base 15 mm.  Some peptic duodenitis in the bulb also.  Gastric biopsies and duodenal biopsies without malignancy.  He had been on Eliquis and aspirin without PPI, he had post coronary  artery bypass grafting atrial fibrillation.  In the interim he was transfused his hemoglobin hit the 9 range, he has a history of erythrocytosis in the setting of testosterone use noted prior to his bypass surgery and acute blood loss anemia.  He is is awaiting to see Dr. Jana [name] with an appointment later today.  Labs on 10 September showed that his hemoglobin was 9.5, it was 9.8 on August 27.  His RDW is up his MCV is declining though still normal.  His iron saturation was 5% with a TIBC 370 and a ferritin 155.  I had him restart iron after the September 10 lab review.  He is not having any  melena.  He is having problems with orthopnea and dyspnea.  His weight is fluctuating and his blood pressure is fluctuating some.  His cardiologist at [facility], Dr. Venetia [name], prescribed furosemide 40 and then 20 mg.  He is using this intermittently and notices reduced weight and improved breathing on the days after he takes this.  His biggest problem is lying flat.  He is not having any chest pain or anything like angina.  I have reviewed the cardiology follow-up note of August 28.  The furosemide was just called in within the past week I believe.  He did have some question as to whether or not allergies were making him cough at night, they changed all the filters and there HVAC system.  Oscar [name]'s wife participates in the visit through the phone.  Wt Readings from Last 3 Encounters:  12/17/18 250 lb 3.2 oz (113.5 kg)  11/20/18 253 lb (114.8 kg)  11/09/18 246 lb 0.5 oz (111.6 kg)   He has been back to his exercise regimen lifting weights and doing cardio and not having significant problems with that as far as I can tell. Allergies  Allergen Reactions   Acetaminophen Other (See Comments)    Bleeding issues   Ibuprofen Other (See Comments)    Bleeding issues   Advair Diskus [Fluticasone-Salmeterol] Cough   Latex Itching   Current Meds  Medication Sig   acidophilus (RISAQUAD) CAPS capsule Take 1 capsule by mouth 2 (two) times daily.   ALPRAZolam (XANAX) 1 MG tablet Take 1 mg by mouth at bedtime as needed for sleep.   aspirin EC 81 MG tablet Take 1 tablet (81 mg total) by mouth daily.   Cholecalciferol (DIALYVITE VITAMIN D 5000 PO) Take 1 tablet by mouth daily.   DHEA 50 MG CAPS Take 50 mg by mouth daily.   ezetimibe (ZETIA) 10 MG tablet Take 10 mg by mouth daily.   furosemide (LASIX) 20 MG tablet Take 20 mg by mouth as needed.   metFORMIN (GLUCOPHAGE) 500 MG tablet Take 1,000 mg by mouth 2 (two) times daily. Pt takes one tablet in the morning and two at night.   metoprolol tartrate  (LOPRESSOR) 25 MG tablet Take 1 tablet (25 mg total) by mouth 2 (two) times daily.   Milk Thistle 1000 MG CAPS Take 1 capsule by mouth daily.   OVER THE COUNTER MEDICATION Take 1 tablet by mouth daily. Primal defense probiotic tablet   pantoprazole (PROTONIX) 40 MG tablet Take 1 tablet (40 mg total) by mouth daily before breakfast.   rosuvastatin (CRESTOR) 40 MG tablet Take 1 tablet (40 mg total) by mouth daily. (Patient taking differently: Take 20 mg by mouth daily. )   vitamin B-12 (CYANOCOBALAMIN) 100 MCG tablet Take 100 mcg by mouth daily.   [DISCONTINUED] pantoprazole (PROTONIX) 40 MG tablet Take  1 tablet (40 mg total) by mouth 2 (two) times daily.   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 Lynita Lombard in [place], [place]    Lower GI bleed    Hx: of   Myocardial infarction [facility])    Nephrolithiasis    2-3 kidney stones in past   Obesity    Pneumonia    Prediabetes    Schatzki's ring of distal esophagus    Sleep apnea 2003   CPAP machine, pt does not know settings   Past Surgical History:  Procedure Laterality Date   APPENDECTOMY   10-33yrs ago   BIOPSY  11/10/2018   Procedure: BIOPSY;  Surgeon: Lavena Bullion, DO;  Location: [facility];  Service: Gastroenterology;;   CLOSED REDUCTION FINGER WITH PERCUTANEOUS PINNING Left 04/22/2013   Procedure: LEFT WRIST CLOSED REDUCTION CONVERTED TO OPEN REDUCTION INTERNAL FIXATION, LEFT CARPAL TUNNEL RELEASE;  Surgeon: Linna Hoff, MD;  Location: [facility];  Service: Orthopedics;  Laterality: Left;   COLONOSCOPY  07-09-09   benign polyp repeat 5 years, Dr.Sam Ganem   CORONARY ARTERY BYPASS GRAFT N/A  10/22/2018   Procedure: CORONARY ARTERY BYPASS GRAFTING (CABG) times four using right and left internal mammary arteries, left radial artery, and right greater saphenous vein harvested endoscopically.;  Surgeon: Wonda Olds, MD;  Location: [place];  Service: Open Heart Surgery;  Laterality: N/A;   CORONARY ARTERY BYPASS GRAFT     CORONARY/GRAFT ACUTE MI REVASCULARIZATION N/A 10/21/2018   Procedure: Coronary/Graft Acute MI Revascularization;  Surgeon: Belva Crome, MD;  Location: [place];  Service: Cardiovascular;  Laterality: N/A;   ESOPHAGOGASTRODUODENOSCOPY (EGD) WITH PROPOFOL N/A 11/10/2018   Procedure: ESOPHAGOGASTRODUODENOSCOPY (EGD) WITH PROPOFOL;  Surgeon: Lavena Bullion, DO;  Location: [facility];  Service: Gastroenterology;  Laterality: N/A;   HERNIA REPAIR     repaired x 4   INSERTION OF MESH  04/09/2012   Procedure: INSERTION OF MESH;  Surgeon: Earnstine Regal, MD;  Location: [facility];  Service: General;  Laterality: N/A;   LEFT HEART CATH AND CORONARY ANGIOGRAPHY N/A 10/21/2018   Procedure: LEFT HEART CATH AND CORONARY ANGIOGRAPHY;  Surgeon: Belva Crome, MD;  Location: [place] [facility];  Service: Cardiovascular;  Laterality: N/A;   RADIAL ARTERY HARVEST Left 10/22/2018   Procedure: LEFT RADIAL ARTERY HARVEST;  Surgeon: Wonda Olds, MD;  Location: [place];  Service: Open Heart Surgery;  Laterality: Left;   TEE WITHOUT CARDIOVERSION N/A 10/22/2018   Procedure: TRANSESOPHAGEAL ECHOCARDIOGRAM (TEE);  Surgeon: Wonda Olds, MD;  Location: [place];  Service: Open Heart Surgery;  Laterality: N/A;   TONSILLECTOMY  as child   UVULECTOMY  yrs ago   for snoring   VENTRAL HERNIA REPAIR  04/09/2012   Procedure: LAPAROSCOPIC VENTRAL HERNIA;  Surgeon: Earnstine Regal, MD;  Location: [facility];  Service: General;  Laterality: N/A;  Laparoscopic Ventral Incisional Hernia Repair with Mesh   Social History   Social History Narrative   Married to [place].     4 children     independent of ADLs.   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   Review of Systems As per HPI  Objective:   Physical Exam BP (!) 148/76    Temp 98.1 F (36.7 C) (Oral)    Ht 6\' 2"  (1.88 m)    Wt 250 lb 3.2 oz (113.5 kg)    BMI 32.12 kg/m   Muscular well-developed well-nourished white man in no acute distress looking mildly pale Neck ? Mild JVD, hard to tell his neck is quite thick Lungs clr bilaterally no dullness to percussion the resident throughout Cor distant s1s2 no rmg abd protuberant/mild diastasis recti no clear ascites but cannot rule out Extremities without cyanosis clubbing or edema Affect appears a bit flat Appropriate otherwise alert and oriented  Data reviewed see HPI extensive review of labs previous x-rays, EKGs, cardiology note from [employer] in August

## 2018-12-17 NOTE — Assessment & Plan Note (Signed)
He had a decent EF with only mild inferior wall motion abnormalities at cardiac cath.  Question if he has a post CABG syndrome.  I did not hear any rub or anything to suggest pericarditis.  Checking a BNP and CMET.  Anemia could be contributing to some of his problems.

## 2018-12-17 NOTE — Assessment & Plan Note (Signed)
See orthopnea and status post STEMI

## 2018-12-17 NOTE — Progress Notes (Signed)
I messaged him that CXR shows pl effusions  He has orthopnea and abdominal distention  We need to call him in AM and have him do a complete abd [place] - tomorrow if possible - I told him to fast after breakfast

## 2018-12-17 NOTE — Assessment & Plan Note (Signed)
Certainly contributed to his current anemia, iron studies are a bit conflicting but I wonder if the ferritin of 155 is not an acute phase reactant as his iron saturation is low and his TIBC is high normal.  Continue iron therapy recheck CBC ferritin today.

## 2018-12-17 NOTE — Patient Instructions (Signed)
Your provider has requested that you go to the basement level for lab work before leaving today. Press "B" on the elevator. The lab is located at the first door on the left as you exit the elevator.   Please go to the x-ray department in the basement before you leave for a chest x-ray.   Decrease your pantoprazole to once a day per Dr Carlean Purl.    I appreciate the opportunity to care for you. Silvano Rusk, MD, [facility]

## 2018-12-18 ENCOUNTER — Other Ambulatory Visit: Payer: Self-pay

## 2018-12-18 DIAGNOSIS — R0601 Orthopnea: Secondary | ICD-10-CM

## 2018-12-18 DIAGNOSIS — R140 Abdominal distension (gaseous): Secondary | ICD-10-CM

## 2018-12-18 NOTE — Progress Notes (Signed)
Yes he should stay on the lasix

## 2018-12-18 NOTE — Progress Notes (Signed)
Us/

## 2018-12-18 NOTE — Addendum Note (Signed)
Addended by: Chauncey Cruel on: 12/18/2018 10:29 AM   Modules accepted: Orders

## 2018-12-21 ENCOUNTER — Ambulatory Visit (HOSPITAL_COMMUNITY): Payer: No Typology Code available for payment source

## 2018-12-22 ENCOUNTER — Ambulatory Visit (HOSPITAL_COMMUNITY)
Admission: RE | Admit: 2018-12-22 | Discharge: 2018-12-22 | Disposition: A | Discharge status: Home / Self Care | Payer: No Typology Code available for payment source | Source: Ambulatory Visit | Attending: Internal Medicine | Admitting: Internal Medicine

## 2018-12-22 ENCOUNTER — Other Ambulatory Visit: Payer: Self-pay

## 2018-12-22 DIAGNOSIS — R140 Abdominal distension (gaseous): Secondary | ICD-10-CM | POA: Diagnosis present

## 2018-12-22 DIAGNOSIS — R0601 Orthopnea: Secondary | ICD-10-CM | POA: Diagnosis present

## 2018-12-23 ENCOUNTER — Encounter (HOSPITAL_COMMUNITY): Payer: Self-pay

## 2018-12-23 ENCOUNTER — Other Ambulatory Visit: Payer: Self-pay | Admitting: Internal Medicine

## 2018-12-23 DIAGNOSIS — D751 Secondary polycythemia: Secondary | ICD-10-CM

## 2018-12-30 ENCOUNTER — Other Ambulatory Visit: Payer: Self-pay | Admitting: Oncology

## 2018-12-31 ENCOUNTER — Other Ambulatory Visit: Payer: Self-pay | Admitting: Adult Health

## 2018-12-31 DIAGNOSIS — I2510 Atherosclerotic heart disease of native coronary artery without angina pectoris: Secondary | ICD-10-CM

## 2019-01-06 LAB — JAK2 (INCLUDING V617F AND EXON 12), MPL,& CALR-NEXT GEN SEQ

## 2019-01-07 ENCOUNTER — Encounter: Payer: Self-pay | Admitting: Oncology

## 2019-01-08 ENCOUNTER — Telehealth (HOSPITAL_COMMUNITY): Payer: Self-pay

## 2019-01-08 NOTE — Telephone Encounter (Signed)
No response from pt regarding CR.  Closed referral.  

## 2019-01-12 ENCOUNTER — Ambulatory Visit (HOSPITAL_COMMUNITY): Payer: No Typology Code available for payment source

## 2019-01-19 ENCOUNTER — Other Ambulatory Visit (INDEPENDENT_AMBULATORY_CARE_PROVIDER_SITE_OTHER): Payer: No Typology Code available for payment source

## 2019-01-19 ENCOUNTER — Other Ambulatory Visit: Payer: Self-pay | Admitting: Internal Medicine

## 2019-01-19 DIAGNOSIS — D751 Secondary polycythemia: Secondary | ICD-10-CM

## 2019-01-19 DIAGNOSIS — D62 Acute posthemorrhagic anemia: Secondary | ICD-10-CM

## 2019-01-19 LAB — FERRITIN: Ferritin: 25.1 ng/mL (ref 22.0–322.0)

## 2019-01-19 LAB — CBC WITH DIFFERENTIAL/PLATELET
Basophils Absolute: 0.1 10*3/uL (ref 0.0–0.1)
Basophils Relative: 0.7 % (ref 0.0–3.0)
Eosinophils Absolute: 0.2 10*3/uL (ref 0.0–0.7)
Eosinophils Relative: 2.1 % (ref 0.0–5.0)
HCT: 38.4 % — ABNORMAL LOW (ref 39.0–52.0)
Hemoglobin: 11.8 g/dL — ABNORMAL LOW (ref 13.0–17.0)
Lymphocytes Relative: 14 % (ref 12.0–46.0)
Lymphs Abs: 1.2 10*3/uL (ref 0.7–4.0)
MCHC: 30.8 g/dL (ref 30.0–36.0)
MCV: 73.9 fl — ABNORMAL LOW (ref 78.0–100.0)
Monocytes Absolute: 1.1 10*3/uL — ABNORMAL HIGH (ref 0.1–1.0)
Monocytes Relative: 13.4 % — ABNORMAL HIGH (ref 3.0–12.0)
Neutro Abs: 5.8 10*3/uL (ref 1.4–7.7)
Neutrophils Relative %: 69.8 % (ref 43.0–77.0)
Platelets: 361 10*3/uL (ref 150.0–400.0)
RBC: 5.19 Mil/uL (ref 4.22–5.81)
RDW: 22.7 % — ABNORMAL HIGH (ref 11.5–15.5)
WBC: 8.3 10*3/uL (ref 4.0–10.5)

## 2019-01-21 ENCOUNTER — Other Ambulatory Visit: Payer: Self-pay | Admitting: Internal Medicine

## 2019-01-21 DIAGNOSIS — D62 Acute posthemorrhagic anemia: Secondary | ICD-10-CM

## 2019-01-22 ENCOUNTER — Encounter: Payer: Self-pay | Admitting: Internal Medicine

## 2019-01-29 ENCOUNTER — Other Ambulatory Visit: Payer: Self-pay

## 2019-01-29 DIAGNOSIS — Z20822 Contact with and (suspected) exposure to covid-19: Secondary | ICD-10-CM

## 2019-01-31 LAB — NOVEL CORONAVIRUS, NAA: SARS-CoV-2, NAA: NOT DETECTED

## 2019-02-18 ENCOUNTER — Other Ambulatory Visit: Payer: Self-pay | Admitting: Physician Assistant

## 2019-02-23 ENCOUNTER — Other Ambulatory Visit: Payer: Self-pay | Admitting: Internal Medicine

## 2019-02-23 DIAGNOSIS — D62 Acute posthemorrhagic anemia: Secondary | ICD-10-CM

## 2019-03-01 ENCOUNTER — Other Ambulatory Visit (INDEPENDENT_AMBULATORY_CARE_PROVIDER_SITE_OTHER): Payer: No Typology Code available for payment source

## 2019-03-01 DIAGNOSIS — D62 Acute posthemorrhagic anemia: Secondary | ICD-10-CM

## 2019-03-01 LAB — FERRITIN: Ferritin: 15.4 ng/mL — ABNORMAL LOW (ref 22.0–322.0)

## 2019-03-01 LAB — CBC
HCT: 44.2 % (ref 39.0–52.0)
Hemoglobin: 13.9 g/dL (ref 13.0–17.0)
MCHC: 31.6 g/dL (ref 30.0–36.0)
MCV: 76.4 fl — ABNORMAL LOW (ref 78.0–100.0)
Platelets: 253 10*3/uL (ref 150.0–400.0)
RBC: 5.78 Mil/uL (ref 4.22–5.81)
RDW: 27.8 % — ABNORMAL HIGH (ref 11.5–15.5)
WBC: 8.4 10*3/uL (ref 4.0–10.5)

## 2019-03-02 NOTE — Progress Notes (Signed)
Oscar [name],  Hemoglobin is good but ferritin (iron stores) still on the low side.   I plan to call and discuss in next couple of days.   CEG

## 2019-03-04 ENCOUNTER — Other Ambulatory Visit: Payer: Self-pay | Admitting: Internal Medicine

## 2019-03-04 MED ORDER — FERROUS FUM-IRON POLYSACCH-FA 162-115.2-1 MG PO CAPS: 1.0000 | ORAL_CAPSULE | Freq: Every day | ORAL | 1 refills | Status: DC

## 2019-03-04 NOTE — Progress Notes (Signed)
We spoke on the phone and plans are:  1) schedule EGD to f/u duodenal ulcer and iron-def anemia - office to call and arrange try for this month but Jan ok also 2) dc current iron pill 3) I rxed an iron supplement  4) OK to take Rx fish oil from cardiology

## 2019-03-08 ENCOUNTER — Telehealth: Payer: Self-pay | Admitting: Internal Medicine

## 2019-03-08 NOTE — Telephone Encounter (Signed)
Message left to notify patient that he has to have a pre-visit before EGD.

## 2019-03-08 NOTE — Telephone Encounter (Signed)
Yes pt will have to complete the previsit due to guidelines, pts have to complete this in order to have the needed paperwork signed. There are no charges for previsits.

## 2019-03-16 ENCOUNTER — Telehealth: Payer: Self-pay | Admitting: *Deleted

## 2019-03-16 NOTE — Telephone Encounter (Signed)
He had the STEMI but then was revascularized with a CABG so I do not think we need clearance but have cced Jenny Reichmann

## 2019-03-16 NOTE — Telephone Encounter (Signed)
   Dr Carlean Purl,  This pt is scheduled to have an EGD with you 12-22- he had a STEMI 09-2018- it has been greater than 3 months but less than 6 months since this episode- Does he needs cardiac clearance specifically for this procedure?  He saw cardio last 01-19-2019 but EGD clearance is not mentioned -  per Jenny Reichmann Nulty's ASA criteria it states if less than 6 months the cardiac clearance will be needed.  Please advise and thanks for your time, Lelan Pons

## 2019-03-17 NOTE — Telephone Encounter (Signed)
Dr. Carlean Purl and Laurine Blazer,  This pt is cleared for anesthetic care at [facility].  Thanks,  Osvaldo Angst

## 2019-03-19 ENCOUNTER — Ambulatory Visit (AMBULATORY_SURGERY_CENTER): Payer: Self-pay | Admitting: *Deleted

## 2019-03-19 ENCOUNTER — Encounter: Payer: Self-pay | Admitting: Internal Medicine

## 2019-03-19 ENCOUNTER — Other Ambulatory Visit: Payer: Self-pay

## 2019-03-19 ENCOUNTER — Ambulatory Visit: Payer: No Typology Code available for payment source

## 2019-03-19 VITALS — Temp 97.1°F | Ht 74.0 in | Wt 262.8 lb

## 2019-03-19 DIAGNOSIS — Z1159 Encounter for screening for other viral diseases: Secondary | ICD-10-CM

## 2019-03-19 DIAGNOSIS — K269 Duodenal ulcer, unspecified as acute or chronic, without hemorrhage or perforation: Secondary | ICD-10-CM

## 2019-03-19 LAB — SARS CORONAVIRUS 2 (TAT 6-24 HRS): SARS Coronavirus 2: NEGATIVE

## 2019-03-19 NOTE — Progress Notes (Signed)
Patient denies any allergies to egg or soy products. Patient denies complications with anesthesia/sedation.  Patient denies oxygen use at home and denies diet medications. OSA, uses cpap at home nightly. Emmi instructions for colonoscopy/endoscopy explained and given to patient.

## 2019-03-23 ENCOUNTER — Other Ambulatory Visit: Payer: Self-pay

## 2019-03-23 ENCOUNTER — Encounter: Payer: Self-pay | Admitting: Internal Medicine

## 2019-03-23 ENCOUNTER — Other Ambulatory Visit: Payer: Self-pay | Admitting: Internal Medicine

## 2019-03-23 ENCOUNTER — Ambulatory Visit (AMBULATORY_SURGERY_CENTER): Payer: No Typology Code available for payment source | Admitting: Internal Medicine

## 2019-03-23 ENCOUNTER — Other Ambulatory Visit (INDEPENDENT_AMBULATORY_CARE_PROVIDER_SITE_OTHER): Payer: No Typology Code available for payment source

## 2019-03-23 VITALS — BP 157/97 | HR 81 | Temp 97.8°F | Resp 15 | Ht 74.0 in | Wt 262.8 lb

## 2019-03-23 DIAGNOSIS — K269 Duodenal ulcer, unspecified as acute or chronic, without hemorrhage or perforation: Secondary | ICD-10-CM | POA: Diagnosis present

## 2019-03-23 DIAGNOSIS — D508 Other iron deficiency anemias: Secondary | ICD-10-CM

## 2019-03-23 LAB — CBC
HCT: 43.9 % (ref 39.0–52.0)
Hemoglobin: 14.1 g/dL (ref 13.0–17.0)
MCHC: 32 g/dL (ref 30.0–36.0)
MCV: 76.6 fl — ABNORMAL LOW (ref 78.0–100.0)
Platelets: 259 10*3/uL (ref 150.0–400.0)
RBC: 5.74 Mil/uL (ref 4.22–5.81)
RDW: 28.2 % — ABNORMAL HIGH (ref 11.5–15.5)
WBC: 8.4 10*3/uL (ref 4.0–10.5)

## 2019-03-23 LAB — FERRITIN: Ferritin: 14.4 ng/mL — ABNORMAL LOW (ref 22.0–322.0)

## 2019-03-23 MED ORDER — SODIUM CHLORIDE 0.9 % IV SOLN: 500.0000 mL | Freq: Once | INTRAVENOUS | Status: DC

## 2019-03-23 NOTE — Progress Notes (Signed)
Temp by JB, VS by DT  Pt's states no medical or surgical changes since previsit or office visit. 

## 2019-03-23 NOTE — Progress Notes (Signed)
To PACU, VSS. Report to [email or web address] 

## 2019-03-23 NOTE — Op Note (Signed)
[place] Patient Name: Oscar [name] Procedure Date: 03/23/2019 2:57 PM MRN: [id number] Endoscopist: Gatha Mayer , MD Age: 58 Referring MD:  Date of Birth: [birth year] Gender: Male Account #: [id number] Procedure:                Upper GI endoscopy Indications:              Follow-up of acute duodenal ulcer with hemorrhage Medicines:                Propofol per Anesthesia, Monitored Anesthesia Care Procedure:                Pre-Anesthesia Assessment:                           - Prior to the procedure, a History and Physical                            was performed, and patient medications and                            allergies were reviewed. The patient's tolerance of                            previous anesthesia was also reviewed. The risks                            and benefits of the procedure and the sedation                            options and risks were discussed with the patient.                            All questions were answered, and informed consent                            was obtained. Prior Anticoagulants: The patient has                            taken no previous anticoagulant or antiplatelet                            agents. ASA Grade Assessment: III - A patient with                            severe systemic disease. After reviewing the risks                            and benefits, the patient was deemed in                            satisfactory condition to undergo the procedure.                           After obtaining informed consent, the endoscope was  passed under direct vision. Throughout the                            procedure, the patient's blood pressure, pulse, and                            oxygen saturations were monitored continuously. The                            Endoscope was introduced through the mouth, and                            advanced to the second part of duodenum. The upper                        GI endoscopy was accomplished without difficulty.                            The patient tolerated the procedure well. Scope In: Scope Out: Findings:                 The esophagus was normal.                           The stomach was normal.                           The examined duodenum was normal. Complications:            No immediate complications. Estimated Blood Loss:     Estimated blood loss: none. Impression:               - Normal esophagus.                           - Normal stomach.                           - Normal examined duodenum. Prior duodenal ulcer                            that caused hemorrhage is gone.                           - No specimens collected. Recommendation:           - Patient has a contact number available for                            emergencies. The signs and symptoms of potential                            delayed complications were discussed with the                            patient. Return to normal activities tomorrow.  Written discharge instructions were provided to the                            patient.                           - Resume previous diet.                           - Continue present medications.                           - CBC today, ferritin today to f/u iron def anemia                           he is currently off PPI - I think taking 20 mg                            pantoprasole for 3-6 mos more reasonable and will                            discuss Gatha Mayer, MD 03/23/2019 3:42:24 PM This report has been signed electronically.

## 2019-03-23 NOTE — Patient Instructions (Addendum)
The ulcer is healed.  I am checking blood count and iron level again today and will let you know results.  You will stop in the lab on the way out.  In general we treat with pantoprazole or other similar drug for 6-12 months after a bleeding ulcer like you had - not a lot of science behind that but I have done that. You are healed so role would be prevention and aspirin can increase risk of ulcer in future. Think about it and let me know - if you are to stay on the medication I would lower the dose to 20 mg.  I appreciate the opportunity to care for you. Gatha Mayer, MD, [facility]   Discharge instructions given. Normal exam. Resume previous medications. Sent to lab after discharge. YOU HAD AN ENDOSCOPIC PROCEDURE TODAY AT [facility]:   Refer to the procedure report that was given to you for any specific questions about what was found during the examination.  If the procedure report does not answer your questions, please call your gastroenterologist to clarify.  If you requested that your care partner not be given the details of your procedure findings, then the procedure report has been included in a sealed envelope for you to review at your convenience later.  YOU SHOULD EXPECT: Some feelings of bloating in the abdomen. Passage of more gas than usual.  Walking can help get rid of the air that was put into your GI tract during the procedure and reduce the bloating. If you had a lower endoscopy (such as a colonoscopy or flexible sigmoidoscopy) you may notice spotting of blood in your stool or on the toilet paper. If you underwent a bowel prep for your procedure, you may not have a normal bowel movement for a few days.  Please Note:  You might notice some irritation and congestion in your nose or some drainage.  This is from the oxygen used during your procedure.  There is no need for concern and it should clear up in a day or so.  SYMPTOMS TO REPORT IMMEDIATELY:    Following  upper endoscopy (EGD)  Vomiting of blood or coffee ground material  New chest pain or pain under the shoulder blades  Painful or persistently difficult swallowing  New shortness of breath  Fever of 100F or higher  Black, tarry-looking stools  For urgent or emergent issues, a gastroenterologist can be reached at any hour by calling [phone].   DIET:  We do recommend a small meal at first, but then you may proceed to your regular diet.  Drink plenty of fluids but you should avoid alcoholic beverages for 24 hours.  ACTIVITY:  You should plan to take it easy for the rest of today and you should NOT DRIVE or use heavy machinery until tomorrow (because of the sedation medicines used during the test).    FOLLOW UP: Our staff will call the number listed on your records 48-72 hours following your procedure to check on you and address any questions or concerns that you may have regarding the information given to you following your procedure. If we do not reach you, we will leave a message.  We will attempt to reach you two times.  During this call, we will ask if you have developed any symptoms of COVID 19. If you develop any symptoms (ie: fever, flu-like symptoms, shortness of breath, cough etc.) before then, please call [phone].  If you test positive for Covid 19  in the 2 weeks post procedure, please call and report this information to [place].    If any biopsies were taken you will be contacted by phone or by letter within the next 1-3 weeks.  Please call us at [phone] if you have not heard about the biopsies in 3 weeks.    SIGNATURES/CONFIDENTIALITY: You and/or your care partner have signed paperwork which will be entered into your electronic medical record.  These signatures attest to the fact that that the information above on your After Visit Summary has been reviewed and is understood.  Full responsibility of the confidentiality of this discharge information lies with you and/or  your care-partner.

## 2019-03-24 ENCOUNTER — Other Ambulatory Visit: Payer: Self-pay | Admitting: Internal Medicine

## 2019-03-24 MED ORDER — PANTOPRAZOLE SODIUM 20 MG PO TBEC: 20.0000 mg | DELAYED_RELEASE_TABLET | Freq: Every day | ORAL | 3 refills | Status: DC

## 2019-03-25 ENCOUNTER — Telehealth: Payer: Self-pay | Admitting: *Deleted

## 2019-03-25 NOTE — Telephone Encounter (Signed)
  Follow up Call-  Call back number 03/23/2019  Post procedure Call Back phone  # [phone]  Permission to leave phone message Yes  Some recent data might be hidden     Patient questions:  Do you have a fever, pain , or abdominal swelling? No. Pain Score  0 *  Have you tolerated food without any problems? Yes.    Have you been able to return to your normal activities? Yes.    Do you have any questions about your discharge instructions: Diet   No. Medications  No. Follow up visit  No.  Do you have questions or concerns about your Care? No.  Actions: * If pain score is 4 or above: No action needed, pain <4.   1. Have you developed a fever since your procedure? no  2.   Have you had an respiratory symptoms (SOB or cough) since your procedure? no  3.   Have you tested positive for COVID 19 since your procedure no  4.   Have you had any family members/close contacts diagnosed with the COVID 19 since your procedure?  no   If yes to any of these questions please route to Joylene John, RN and Alphonsa Gin, Therapist, sports.

## 2019-03-25 NOTE — Telephone Encounter (Signed)
No answer for post procedure call back. Left message for patient to call with questions or concerns. 

## 2019-04-16 ENCOUNTER — Encounter: Payer: Self-pay | Admitting: Oncology

## 2019-04-16 ENCOUNTER — Other Ambulatory Visit: Payer: Self-pay | Admitting: Oncology

## 2019-04-16 DIAGNOSIS — D751 Secondary polycythemia: Secondary | ICD-10-CM

## 2019-04-19 ENCOUNTER — Telehealth: Payer: Self-pay | Admitting: Oncology

## 2019-04-19 NOTE — Telephone Encounter (Signed)
Scheduled appt per 1/18 sch message - unable to reach pt - left message with appt date and time

## 2019-04-20 ENCOUNTER — Inpatient Hospital Stay: Payer: No Typology Code available for payment source | Attending: Oncology

## 2019-04-20 ENCOUNTER — Other Ambulatory Visit: Payer: Self-pay

## 2019-04-20 DIAGNOSIS — D751 Secondary polycythemia: Secondary | ICD-10-CM | POA: Diagnosis present

## 2019-04-20 LAB — CBC WITH DIFFERENTIAL/PLATELET
Abs Immature Granulocytes: 0.03 10*3/uL (ref 0.00–0.07)
Basophils Absolute: 0.1 10*3/uL (ref 0.0–0.1)
Basophils Relative: 1 %
Eosinophils Absolute: 0.1 10*3/uL (ref 0.0–0.5)
Eosinophils Relative: 1 %
HCT: 49.3 % (ref 39.0–52.0)
Hemoglobin: 15.6 g/dL (ref 13.0–17.0)
Immature Granulocytes: 0 %
Lymphocytes Relative: 11 %
Lymphs Abs: 1.1 10*3/uL (ref 0.7–4.0)
MCH: 26.2 pg (ref 26.0–34.0)
MCHC: 31.6 g/dL (ref 30.0–36.0)
MCV: 82.7 fL (ref 80.0–100.0)
Monocytes Absolute: 1.2 10*3/uL — ABNORMAL HIGH (ref 0.1–1.0)
Monocytes Relative: 12 %
Neutro Abs: 7.4 10*3/uL (ref 1.7–7.7)
Neutrophils Relative %: 75 %
Platelets: 239 10*3/uL (ref 150–400)
RBC: 5.96 MIL/uL — ABNORMAL HIGH (ref 4.22–5.81)
RDW: 24.4 % — ABNORMAL HIGH (ref 11.5–15.5)
WBC: 9.8 10*3/uL (ref 4.0–10.5)
nRBC: 0 % (ref 0.0–0.2)

## 2019-04-20 LAB — RETICULOCYTES
Immature Retic Fract: 16.7 % — ABNORMAL HIGH (ref 2.3–15.9)
RBC.: 5.92 MIL/uL — ABNORMAL HIGH (ref 4.22–5.81)
Retic Count, Absolute: 55.1 10*3/uL (ref 19.0–186.0)
Retic Ct Pct: 0.9 % (ref 0.4–3.1)

## 2019-04-20 LAB — SAVE SMEAR(SSMR), FOR PROVIDER SLIDE REVIEW

## 2019-04-21 LAB — IRON AND TIBC
Iron: 63 ug/dL (ref 42–163)
Saturation Ratios: 16 % — ABNORMAL LOW (ref 20–55)
TIBC: 385 ug/dL (ref 202–409)
UIBC: 322 ug/dL (ref 117–376)

## 2019-04-21 LAB — ERYTHROPOIETIN: Erythropoietin: 13.2 m[IU]/mL (ref 2.6–18.5)

## 2019-04-21 LAB — FERRITIN: Ferritin: 22 ng/mL — ABNORMAL LOW (ref 24–336)

## 2019-04-23 ENCOUNTER — Encounter: Payer: Self-pay | Admitting: Oncology

## 2019-04-23 ENCOUNTER — Other Ambulatory Visit: Payer: Self-pay | Admitting: Oncology

## 2019-06-03 ENCOUNTER — Encounter: Payer: Self-pay | Admitting: *Deleted

## 2019-06-03 ENCOUNTER — Encounter: Payer: Self-pay | Admitting: Oncology

## 2020-03-12 ENCOUNTER — Other Ambulatory Visit: Payer: Self-pay | Admitting: Internal Medicine

## 2020-06-01 ENCOUNTER — Telehealth: Payer: Self-pay

## 2020-06-01 NOTE — Telephone Encounter (Signed)
NOTES ON FILE FROM  [facility] [phone], SENT REFERRAL TO SCHEDULING

## 2020-06-01 NOTE — Telephone Encounter (Signed)
Referral received from Dr. Melford Aase to establish cardiac care. The patient was with [facility] and his MD has retired.  Will establish with first available general cardiologist.

## 2020-06-02 ENCOUNTER — Telehealth: Payer: Self-pay

## 2020-06-02 NOTE — Telephone Encounter (Signed)
NOTES ON FILE FROM [facility] [id number], SENT REFERRAL TO SCHEDULING

## 2020-06-02 NOTE — Telephone Encounter (Signed)
Left message to call back  

## 2020-06-09 NOTE — Telephone Encounter (Signed)
Left 3rd message for patient that if he would like to reestablish care to call the office to be scheduled.  Scheduling has records.

## 2020-08-01 ENCOUNTER — Other Ambulatory Visit: Payer: Self-pay

## 2020-08-01 ENCOUNTER — Encounter (HOSPITAL_COMMUNITY): Payer: Self-pay | Admitting: Cardiology

## 2020-08-01 ENCOUNTER — Ambulatory Visit (HOSPITAL_COMMUNITY)
Admission: RE | Admit: 2020-08-01 | Discharge: 2020-08-01 | Disposition: A | Discharge status: Home / Self Care | Payer: No Typology Code available for payment source | Source: Ambulatory Visit | Attending: Cardiology | Admitting: Cardiology

## 2020-08-01 VITALS — BP 130/80 | HR 81 | Wt 265.2 lb

## 2020-08-01 DIAGNOSIS — Z8249 Family history of ischemic heart disease and other diseases of the circulatory system: Secondary | ICD-10-CM | POA: Insufficient documentation

## 2020-08-01 DIAGNOSIS — I2510 Atherosclerotic heart disease of native coronary artery without angina pectoris: Secondary | ICD-10-CM | POA: Diagnosis present

## 2020-08-01 DIAGNOSIS — Z7982 Long term (current) use of aspirin: Secondary | ICD-10-CM | POA: Diagnosis not present

## 2020-08-01 DIAGNOSIS — I252 Old myocardial infarction: Secondary | ICD-10-CM | POA: Insufficient documentation

## 2020-08-01 DIAGNOSIS — Z951 Presence of aortocoronary bypass graft: Secondary | ICD-10-CM | POA: Insufficient documentation

## 2020-08-01 DIAGNOSIS — Z79899 Other long term (current) drug therapy: Secondary | ICD-10-CM | POA: Diagnosis not present

## 2020-08-01 DIAGNOSIS — E785 Hyperlipidemia, unspecified: Secondary | ICD-10-CM | POA: Diagnosis not present

## 2020-08-01 DIAGNOSIS — I10 Essential (primary) hypertension: Secondary | ICD-10-CM | POA: Diagnosis not present

## 2020-08-01 HISTORY — DX: Heart failure, unspecified: I50.9

## 2020-08-01 LAB — COMPREHENSIVE METABOLIC PANEL
ALT: 22 U/L (ref 0–44)
AST: 34 U/L (ref 15–41)
Albumin: 3.9 g/dL (ref 3.5–5.0)
Alkaline Phosphatase: 49 U/L (ref 38–126)
Anion gap: 8 (ref 5–15)
BUN: 16 mg/dL (ref 6–20)
CO2: 27 mmol/L (ref 22–32)
Calcium: 9.3 mg/dL (ref 8.9–10.3)
Chloride: 102 mmol/L (ref 98–111)
Creatinine, Ser: 1.08 mg/dL (ref 0.61–1.24)
GFR, Estimated: >60 mL/min (ref >60)
Glucose, Bld: 110 mg/dL — ABNORMAL HIGH (ref 70–99)
Potassium: 3.9 mmol/L (ref 3.5–5.1)
Sodium: 137 mmol/L (ref 135–145)
Total Bilirubin: 0.8 mg/dL (ref 0.3–1.2)
Total Protein: 7.5 g/dL (ref 6.5–8.1)

## 2020-08-01 LAB — LIPID PANEL
Cholesterol: 72 mg/dL (ref 0–200)
HDL: 37 mg/dL — ABNORMAL LOW (ref >40)
LDL Cholesterol: 17 mg/dL (ref 0–99)
Total CHOL/HDL Ratio: 1.9 RATIO
Triglycerides: 92 mg/dL (ref <150)
VLDL: 18 mg/dL (ref 0–40)

## 2020-08-01 LAB — CBC
HCT: 49.7 % (ref 39.0–52.0)
Hemoglobin: 16.6 g/dL (ref 13.0–17.0)
MCH: 29.6 pg (ref 26.0–34.0)
MCHC: 33.4 g/dL (ref 30.0–36.0)
MCV: 88.8 fL (ref 80.0–100.0)
Platelets: 226 10*3/uL (ref 150–400)
RBC: 5.6 MIL/uL (ref 4.22–5.81)
RDW: 14.6 % (ref 11.5–15.5)
WBC: 6.3 10*3/uL (ref 4.0–10.5)
nRBC: 0 % (ref 0.0–0.2)

## 2020-08-01 NOTE — Progress Notes (Signed)
PCP: Dr. Melford Aase Cardiology: Dr. Aundra Dubin  60 y.o. with history of CAD s/p CABG was self-referred for evaluation of CAD.  Patient has a very strong family history of premature CAD.  He was in his usual state of health until 7/20 when he presented to the ER with chest pain and was found to have NSTEMI.  Cath showed 3 vessel disease, patient was taken for CABG x 4 by Dr. Orvan Seen.  Post-op, he had brief atrial fibrillation that resolved quickly.  No further atrial fibrillation has been seen.  Echo in 7/20 showed EF 60-65%.  Cardiac MRI in 6/21 at [facility] was normal.  Patient saw a cardiologist at [facility] until recently when his cardiologist retired.   He has done well since MI.  SBP 110s-120s when he checks at home.  He has a Cardiomobile device on his phone and has been in NSR (checks daily).  He exercises regularly; lifts weights, rides bike, walks.  No exertional dyspnea or chest pain.  Cholesterol level has been very low, he had been on Crestor, Repatha, and Zetia.  He is only on a low dose of Crestor due to concern that it was causing muscle/joint pain at high dose.  Zetia was stopped with very low LDL.   ECG (personally reviewed): NSR, LAFB, IVCD (118 msec).  No change from prior.   Labs (3/22): creatinine 1.3, LDL 16, HDL 28.   PMH: 1. CAD: NSTEMI in 7/20 with cath showing 3 vessel disease.  - CABG (7/20) with LIMA-LAD, RIMA-PDA, SVG-diagonal, radial-OM.  - Echo (7/20): EF 60-65%, normal RV.  - Stress echo (6/21): EF >55%, no ischemia.  2. Atrial fibrillation: Paroxysmal, only noted briefly post-op CABG.  3. Hyperlipidemia 4. HTN 5. PUD: GI bleeding post-MI.  6. S/p appendectomy 7. Multiple abdominal ventral hernia surgeries.   FH: Premature CAD.  Mother with MI in her 5s, brother with [place], another brother with CABG, father died with cardiac arrest.   SH: Nonsmoker.  Married with children, lives in [place].  Owns [employer].   ROS: All systems reviewed and negative except  as per HPI.   Current Outpatient Medications  Medication Sig Dispense Refill  . acidophilus (RISAQUAD) CAPS capsule Take 1 capsule by mouth 2 (two) times daily.    Marland Kitchen ALPRAZolam (XANAX) 1 MG tablet Take 1 mg by mouth at bedtime as needed for sleep.    Marland Kitchen amLODipine (NORVASC) 2.5 MG tablet Take 2.5 mg by mouth daily.    . Ascorbic Acid (VITAMIN C CR) 1000 MG TBCR Take 1 tablet by mouth 2 (two) times daily.    Marland Kitchen aspirin EC 81 MG tablet Take 1 tablet (81 mg total) by mouth daily.    . Cholecalciferol (DIALYVITE VITAMIN D 5000 PO) Take 1 tablet by mouth daily.    . Cyanocobalamin (VITAMIN B 12 PO) Take 1 tablet by mouth 4 (four) times a week.    . Evolocumab (REPATHA [place]) Inject 1 Dose into the skin every 14 (fourteen) days.    . hydrochlorothiazide (MICROZIDE) 12.5 MG capsule Take 12.5 mg by mouth daily.    . metFORMIN (GLUCOPHAGE) 500 MG tablet Take 1,000 mg by mouth 2 (two) times daily. Pt takes one tablet in the morning and two at night.    . Nutritional Supplements (DHEA PO) Take 35 mg by mouth daily in the afternoon.    . Omega-3 Fatty Acids (FISH OIL) 1000 MG CAPS Take 1 capsule by mouth daily in the afternoon.    Marland Kitchen OVER THE  COUNTER MEDICATION Take 1 tablet by mouth daily. Primal defense probiotic tablet    . rosuvastatin (CRESTOR) 5 MG tablet Take 5 mg by mouth daily.     No current facility-administered medications for this encounter.   BP 130/80   Pulse 81   Wt 120.3 kg (265 lb 3.2 oz)   SpO2 95%   BMI 34.05 kg/m  General: NAD Neck: No JVD, no thyromegaly or thyroid nodule.  Lungs: Clear to auscultation bilaterally with normal respiratory effort. CV: Nondisplaced PMI.  Heart regular S1/S2, no S3/S4, no murmur.  No peripheral edema.  No carotid bruit.  Normal pedal pulses.  Abdomen: Soft, nontender, no hepatosplenomegaly, no distention.  Skin: Intact without lesions or rashes.  Neurologic: Alert and oriented x 3.  Psych: Normal affect. Extremities: No clubbing or cyanosis.   HEENT: Normal.   Assessment/Plan: 1.  CAD: NSTEMI in 7/20 with CABG x 4.  He has a very strong family history of CAD.  He is a nonsmoker.  ETT-echo in 6/21 was normal.  No exertional symptoms.   - Continue ASA 81 daily.  - Continue management with Crestor 5 and Repatha.  - He is concerned about the well-being of his grafts from CABG given his strong family history and aggressive coronary disease.  I will try to arrange for a coronary CT to assess his bypass grafts.   2. HTN: BP is well-controlled on his current regimen.  3. Hyperlipidemia: LDL has been very low even after stopping Zetia.   - Repeat lipids today, if LDL remains < 20, can decrease Repatha to every 3 wks but continue Crestor 5 mg daily.   Followup in 6 months if coronary CTA is unremarkable.   Loralie Champagne 08/01/2020

## 2020-08-01 NOTE — Patient Instructions (Signed)
Labs today We will only contact you if something comes back abnormal or we need to make some changes. Otherwise no news is good news!  You have been ordered for a CT of your heart. You will get a call regarding scheduling of this test.   Your physician recommends that you schedule a follow-up appointment in: 6 months. You will get a call to scheduled this appointment.   Please call office at [phone] option [phone] if you have any questions or concerns.   At the [facility], you and your health needs are our priority. As part of our continuing mission to provide you with exceptional heart care, we have created designated Provider Care Teams. These Care Teams include your primary Cardiologist (physician) and Advanced Practice Providers (APPs- Physician Assistants and Nurse Practitioners) who all work together to provide you with the care you need, when you need it.   You may see any of the following providers on your designated Care Team at your next follow up: Marland Kitchen Dr Glori Bickers . Dr Loralie Champagne . Dr Vickki Muff . Darrick Grinder, NP . Lyda Jester, [place] . Audry Riles, PharmD   Please be sure to bring in all your medications bottles to every appointment.

## 2020-08-01 NOTE — H&P (View-Only) (Signed)
PCP: Dr. Melford Aase Cardiology: Dr. Aundra Dubin  60 y.o. with history of CAD s/p CABG was self-referred for evaluation of CAD.  Patient has a very strong family history of premature CAD.  He was in his usual state of health until 7/20 when he presented to the ER with chest pain and was found to have NSTEMI.  Cath showed 3 vessel disease, patient was taken for CABG x 4 by Dr. Orvan Seen.  Post-op, he had brief atrial fibrillation that resolved quickly.  No further atrial fibrillation has been seen.  Echo in 7/20 showed EF 60-65%.  Cardiac MRI in 6/21 at [facility] was normal.  Patient saw a cardiologist at [facility] until recently when his cardiologist retired.   He has done well since MI.  SBP 110s-120s when he checks at home.  He has a Cardiomobile device on his phone and has been in NSR (checks daily).  He exercises regularly; lifts weights, rides bike, walks.  No exertional dyspnea or chest pain.  Cholesterol level has been very low, he had been on Crestor, Repatha, and Zetia.  He is only on a low dose of Crestor due to concern that it was causing muscle/joint pain at high dose.  Zetia was stopped with very low LDL.   ECG (personally reviewed): NSR, LAFB, IVCD (118 msec).  No change from prior.   Labs (3/22): creatinine 1.3, LDL 16, HDL 28.   PMH: 1. CAD: NSTEMI in 7/20 with cath showing 3 vessel disease.  - CABG (7/20) with LIMA-LAD, RIMA-PDA, SVG-diagonal, radial-OM.  - Echo (7/20): EF 60-65%, normal RV.  - Stress echo (6/21): EF >55%, no ischemia.  2. Atrial fibrillation: Paroxysmal, only noted briefly post-op CABG.  3. Hyperlipidemia 4. HTN 5. PUD: GI bleeding post-MI.  6. S/p appendectomy 7. Multiple abdominal ventral hernia surgeries.   FH: Premature CAD.  Mother with MI in her 50s, brother with [place], another brother with CABG, father died with cardiac arrest.   SH: Nonsmoker.  Married with children, lives in [place].  Owns [employer].   ROS: All systems reviewed and negative except  as per HPI.   Current Outpatient Medications  Medication Sig Dispense Refill  . acidophilus (RISAQUAD) CAPS capsule Take 1 capsule by mouth 2 (two) times daily.    Marland Kitchen ALPRAZolam (XANAX) 1 MG tablet Take 1 mg by mouth at bedtime as needed for sleep.    Marland Kitchen amLODipine (NORVASC) 2.5 MG tablet Take 2.5 mg by mouth daily.    . Ascorbic Acid (VITAMIN C CR) 1000 MG TBCR Take 1 tablet by mouth 2 (two) times daily.    Marland Kitchen aspirin EC 81 MG tablet Take 1 tablet (81 mg total) by mouth daily.    . Cholecalciferol (DIALYVITE VITAMIN D 5000 PO) Take 1 tablet by mouth daily.    . Cyanocobalamin (VITAMIN B 12 PO) Take 1 tablet by mouth 4 (four) times a week.    . Evolocumab (REPATHA [place]) Inject 1 Dose into the skin every 14 (fourteen) days.    . hydrochlorothiazide (MICROZIDE) 12.5 MG capsule Take 12.5 mg by mouth daily.    . metFORMIN (GLUCOPHAGE) 500 MG tablet Take 1,000 mg by mouth 2 (two) times daily. Pt takes one tablet in the morning and two at night.    . Nutritional Supplements (DHEA PO) Take 35 mg by mouth daily in the afternoon.    . Omega-3 Fatty Acids (FISH OIL) 1000 MG CAPS Take 1 capsule by mouth daily in the afternoon.    Marland Kitchen OVER THE  COUNTER MEDICATION Take 1 tablet by mouth daily. Primal defense probiotic tablet    . rosuvastatin (CRESTOR) 5 MG tablet Take 5 mg by mouth daily.     No current facility-administered medications for this encounter.   BP 130/80   Pulse 81   Wt 120.3 kg (265 lb 3.2 oz)   SpO2 95%   BMI 34.05 kg/m  General: NAD Neck: No JVD, no thyromegaly or thyroid nodule.  Lungs: Clear to auscultation bilaterally with normal respiratory effort. CV: Nondisplaced PMI.  Heart regular S1/S2, no S3/S4, no murmur.  No peripheral edema.  No carotid bruit.  Normal pedal pulses.  Abdomen: Soft, nontender, no hepatosplenomegaly, no distention.  Skin: Intact without lesions or rashes.  Neurologic: Alert and oriented x 3.  Psych: Normal affect. Extremities: No clubbing or cyanosis.   HEENT: Normal.   Assessment/Plan: 1.  CAD: NSTEMI in 7/20 with CABG x 4.  He has a very strong family history of CAD.  He is a nonsmoker.  ETT-echo in 6/21 was normal.  No exertional symptoms.   - Continue ASA 81 daily.  - Continue management with Crestor 5 and Repatha.  - He is concerned about the well-being of his grafts from CABG given his strong family history and aggressive coronary disease.  I will try to arrange for a coronary CT to assess his bypass grafts.   2. HTN: BP is well-controlled on his current regimen.  3. Hyperlipidemia: LDL has been very low even after stopping Zetia.   - Repeat lipids today, if LDL remains < 20, can decrease Repatha to every 3 wks but continue Crestor 5 mg daily.   Followup in 6 months if coronary CTA is unremarkable.   Loralie Champagne 08/01/2020

## 2020-08-07 ENCOUNTER — Encounter (HOSPITAL_COMMUNITY): Payer: Self-pay | Admitting: Cardiology

## 2020-08-07 ENCOUNTER — Telehealth (HOSPITAL_COMMUNITY): Payer: Self-pay | Admitting: Cardiology

## 2020-08-07 DIAGNOSIS — E1169 Type 2 diabetes mellitus with other specified complication: Secondary | ICD-10-CM

## 2020-08-07 DIAGNOSIS — E785 Hyperlipidemia, unspecified: Secondary | ICD-10-CM

## 2020-08-07 DIAGNOSIS — I2510 Atherosclerotic heart disease of native coronary artery without angina pectoris: Secondary | ICD-10-CM

## 2020-08-07 DIAGNOSIS — R931 Abnormal findings on diagnostic imaging of heart and coronary circulation: Secondary | ICD-10-CM

## 2020-08-07 MED ORDER — REPATHA 140 MG/ML ~~LOC~~ SOSY: 140.0000 mg | PREFILLED_SYRINGE | SUBCUTANEOUS | 11 refills | Status: DC

## 2020-08-07 NOTE — Telephone Encounter (Signed)
-----   Message from Larey Dresser, MD sent at 08/04/2020  4:45 PM EDT ----- I asked him to decrease Repatha to every 3 wks instead of 2 wks, will need to change at pharmacy as it is refrigerated and has to be sent.  He needs lipids done in 2 months please.

## 2020-08-07 NOTE — Telephone Encounter (Signed)
Script updated Lab appt card mailed  My chart message sent to patient

## 2020-08-09 ENCOUNTER — Other Ambulatory Visit (HOSPITAL_COMMUNITY): Payer: Self-pay | Admitting: Emergency Medicine

## 2020-08-09 DIAGNOSIS — I2510 Atherosclerotic heart disease of native coronary artery without angina pectoris: Secondary | ICD-10-CM

## 2020-08-09 MED ORDER — METOPROLOL TARTRATE 100 MG PO TABS: 100.0000 mg | ORAL_TABLET | Freq: Once | ORAL | 0 refills | Status: DC

## 2020-08-09 NOTE — Progress Notes (Signed)
One time dose 100mg  metoprolol tartrate for HR control for upcoming CCTA ordered.  Marchia Bond RN Navigator Cardiac Imaging [facility] [phone] Office  [phone] Cell

## 2020-08-17 ENCOUNTER — Telehealth (HOSPITAL_COMMUNITY): Payer: Self-pay | Admitting: *Deleted

## 2020-08-17 NOTE — Telephone Encounter (Signed)
Attempted to call patient regarding upcoming cardiac CT appointment. °Left message on voicemail with name and callback number ° °Paydon Carll RN Navigator Cardiac Imaging °[facility] °[phone] Office °[phone] Cell ° °

## 2020-08-18 ENCOUNTER — Other Ambulatory Visit (HOSPITAL_COMMUNITY): Payer: Self-pay | Admitting: *Deleted

## 2020-08-18 ENCOUNTER — Ambulatory Visit (HOSPITAL_COMMUNITY)
Admission: RE | Admit: 2020-08-18 | Discharge: 2020-08-18 | Disposition: A | Discharge status: Home / Self Care | Payer: No Typology Code available for payment source | Source: Ambulatory Visit | Attending: Cardiology | Admitting: Cardiology

## 2020-08-18 ENCOUNTER — Telehealth (HOSPITAL_COMMUNITY): Payer: Self-pay | Admitting: *Deleted

## 2020-08-18 ENCOUNTER — Other Ambulatory Visit: Payer: Self-pay

## 2020-08-18 DIAGNOSIS — I2510 Atherosclerotic heart disease of native coronary artery without angina pectoris: Secondary | ICD-10-CM

## 2020-08-18 DIAGNOSIS — R931 Abnormal findings on diagnostic imaging of heart and coronary circulation: Secondary | ICD-10-CM

## 2020-08-18 MED ORDER — NITROGLYCERIN 0.4 MG SL SUBL
SUBLINGUAL_TABLET | SUBLINGUAL | Status: AC
  Filled 2020-08-18: qty 2

## 2020-08-18 MED ORDER — NITROGLYCERIN 0.4 MG SL SUBL
0.8000 mg | SUBLINGUAL_TABLET | Freq: Once | SUBLINGUAL | Status: AC
  Administered 2020-08-18: 0.8 mg via SUBLINGUAL

## 2020-08-18 MED ORDER — METOPROLOL TARTRATE 5 MG/5ML IV SOLN
10.0000 mg | Freq: Once | INTRAVENOUS | Status: AC
  Administered 2020-08-18: 10 mg via INTRAVENOUS

## 2020-08-18 MED ORDER — METOPROLOL TARTRATE 5 MG/5ML IV SOLN
INTRAVENOUS | Status: AC
  Administered 2020-08-18: 10 mg via INTRAVENOUS
  Filled 2020-08-18: qty 20

## 2020-08-18 MED ORDER — IOHEXOL 350 MG/ML SOLN
80.0000 mL | Freq: Once | INTRAVENOUS | Status: AC | PRN
  Administered 2020-08-18: 80 mL via INTRAVENOUS

## 2020-08-18 MED ORDER — METOPROLOL TARTRATE 5 MG/5ML IV SOLN: 10.0000 mg | Freq: Once | INTRAVENOUS | Status: AC

## 2020-08-18 MED ORDER — DILTIAZEM HCL 25 MG/5ML IV SOLN
INTRAVENOUS | Status: AC
  Administered 2020-08-18: 10 mg via INTRAVENOUS
  Filled 2020-08-18: qty 5

## 2020-08-18 MED ORDER — DILTIAZEM HCL 25 MG/5ML IV SOLN: 10.0000 mg | Freq: Once | INTRAVENOUS | Status: AC

## 2020-08-18 NOTE — Telephone Encounter (Signed)
Per Dr Aundra Dubin cardiac CT did not have information needed to eval all of his graphs, pt needs stress echo asap. Order placed will arrange

## 2020-08-18 NOTE — Addendum Note (Signed)
Addended by: Scarlette Calico on: 08/18/2020 04:02 PM   Modules accepted: Orders

## 2020-08-18 NOTE — Telephone Encounter (Signed)
Per Dr Aundra Dubin cx stress echo, CT will contact pt to redo cardiac ct

## 2020-08-21 ENCOUNTER — Ambulatory Visit (HOSPITAL_COMMUNITY)
Admission: RE | Admit: 2020-08-21 | Discharge: 2020-08-21 | Disposition: A | Discharge status: Home / Self Care | Payer: No Typology Code available for payment source | Source: Ambulatory Visit | Attending: Cardiology | Admitting: Cardiology

## 2020-08-21 ENCOUNTER — Other Ambulatory Visit: Payer: Self-pay

## 2020-08-21 DIAGNOSIS — I2510 Atherosclerotic heart disease of native coronary artery without angina pectoris: Secondary | ICD-10-CM | POA: Diagnosis present

## 2020-08-21 MED ORDER — NITROGLYCERIN 0.4 MG SL SUBL
SUBLINGUAL_TABLET | SUBLINGUAL | Status: AC
  Filled 2020-08-21: qty 2

## 2020-08-21 MED ORDER — METOPROLOL TARTRATE 5 MG/5ML IV SOLN
5.0000 mg | INTRAVENOUS | Status: DC | PRN
  Administered 2020-08-21 (×2): 5 mg via INTRAVENOUS

## 2020-08-21 MED ORDER — NITROGLYCERIN 0.4 MG SL SUBL
0.8000 mg | SUBLINGUAL_TABLET | Freq: Once | SUBLINGUAL | Status: AC
  Administered 2020-08-21: 0.8 mg via SUBLINGUAL

## 2020-08-21 MED ORDER — IOHEXOL 350 MG/ML SOLN
100.0000 mL | Freq: Once | INTRAVENOUS | Status: AC | PRN
  Administered 2020-08-21: 100 mL via INTRAVENOUS

## 2020-08-21 MED ORDER — DILTIAZEM HCL 25 MG/5ML IV SOLN
INTRAVENOUS | Status: AC
  Administered 2020-08-21: 5 mg
  Filled 2020-08-21: qty 5

## 2020-08-21 MED ORDER — METOPROLOL TARTRATE 5 MG/5ML IV SOLN
INTRAVENOUS | Status: AC
  Filled 2020-08-21: qty 10

## 2020-08-21 MED ORDER — DILTIAZEM HCL 25 MG/5ML IV SOLN
10.0000 mg | Freq: Once | INTRAVENOUS | Status: AC
  Administered 2020-08-21: 10 mg via INTRAVENOUS

## 2020-08-22 ENCOUNTER — Encounter (HOSPITAL_COMMUNITY): Payer: Self-pay

## 2020-08-22 ENCOUNTER — Telehealth (HOSPITAL_COMMUNITY): Payer: Self-pay | Admitting: Cardiology

## 2020-08-22 MED ORDER — CLOPIDOGREL BISULFATE 75 MG PO TABS: ORAL_TABLET | ORAL | 3 refills | Status: DC

## 2020-08-22 NOTE — Telephone Encounter (Signed)
I called Mr Purohit today to discuss his coronary CT.  This appears to show occlusion of the RIMA-RCA.  There is a high grade stenosis in the proximal RCA with diffuse moderate disease mid-distal RCA.    Patient reports periodic chest tightness, not severe but "knew there was something wrong."  Gets soreness in his chest with exercise and with stress that he had been attributing to muscle tightness but that certainly could be angina.    I have reviewed his cath films with Dr. Burt Knack.  The proximal RCA lesion should be intervenable and would need to angiographically assess the more distal RCA.    Patient will start Plavix today and will arrange for PCI with Dr. Burt Knack on Friday.    Loralie Champagne 08/22/2020 4:09 PM

## 2020-08-22 NOTE — Telephone Encounter (Signed)
Patient scheduled and procedure instructions were dicussed with patient via telephone. A mychart message was also sent to patient with the instructions.   [facility] [place] [id number] [place] [address] Dept: [phone] Loc: [phone]  Jamis Kryder  08/22/2020  You are scheduled for a PCI on Friday, May 27 with Dr. Sherren Mocha.  1. Please arrive at the [facility] (Main Entrance A) at [facility]: [place], [place] [address] at 6:00 AM (This time is two hours before your procedure to ensure your preparation). Free valet parking service is available.   Special note: Every effort is made to have your procedure done on time. Please understand that emergencies sometimes delay scheduled procedures.  2. Diet: Do not eat solid foods after midnight.  The patient may have clear liquids until 5am upon the day of the procedure.  Do not take Diabetes Med Glucophage (Metformin) on the day of the procedure and HOLD 48 HOURS AFTER THE PROCEDURE.  On the morning of your procedure, take your Aspirin and Plavix and any morning medicines NOT listed above.  You may use sips of water.  5. Plan for one night stay--bring personal belongings. 6. Bring a current list of your medications and current insurance cards. 7. You MUST have a responsible person to drive you home. 8. Someone MUST be with you the first 24 hours after you arrive home or your discharge will be delayed. 9. Please wear clothes that are easy to get on and off and wear slip-on shoes.  Thank you for allowing [place] to care for you!   -- [facility]

## 2020-08-24 ENCOUNTER — Telehealth: Payer: Self-pay | Admitting: *Deleted

## 2020-08-24 NOTE — Telephone Encounter (Signed)
Pt contacted pre-catheterization scheduled at [facility] for: Friday Aug 25, 2020 9 AM Verified arrival time and place: [facility]) at: 6 AM-needs BMP/CBC   No solid food after midnight prior to cath, clear liquids until 5 AM day of procedure.  Hold: Metformin-day of procedure and 48 hours post procedure HCTZ-AM of procedure  Except hold medications AM meds can be  taken pre-cath with sips of water including: ASA 81 mg Plavix 75 mg  Confirmed patient has responsible adult to drive home post procedure and be with patient first 24 hours after arriving home: yes  You are allowed ONE visitor in the waiting room during the time you are at the hospital for your procedure. Both you and your visitor must wear a mask once you enter the hospital.   Patient reports does not currently have any symptoms concerning for COVID-19 and no household members with COVID-19 like illness.      Reviewed procedure/mask/visitor instructions with patient.

## 2020-08-25 ENCOUNTER — Ambulatory Visit (HOSPITAL_COMMUNITY)
Admission: RE | Admit: 2020-08-25 | Discharge: 2020-08-25 | Disposition: A | Discharge status: Home / Self Care | Payer: No Typology Code available for payment source | Attending: Cardiovascular Disease | Admitting: Cardiovascular Disease

## 2020-08-25 ENCOUNTER — Encounter (HOSPITAL_COMMUNITY)
Admission: RE | Disposition: A | Discharge status: Home / Self Care | Payer: Self-pay | Source: Home / Self Care | Attending: Cardiovascular Disease

## 2020-08-25 ENCOUNTER — Other Ambulatory Visit: Payer: Self-pay

## 2020-08-25 ENCOUNTER — Encounter (HOSPITAL_COMMUNITY): Payer: Self-pay | Admitting: Cardiovascular Disease

## 2020-08-25 DIAGNOSIS — E785 Hyperlipidemia, unspecified: Secondary | ICD-10-CM | POA: Insufficient documentation

## 2020-08-25 DIAGNOSIS — Z79899 Other long term (current) drug therapy: Secondary | ICD-10-CM | POA: Insufficient documentation

## 2020-08-25 DIAGNOSIS — I25119 Atherosclerotic heart disease of native coronary artery with unspecified angina pectoris: Secondary | ICD-10-CM | POA: Diagnosis not present

## 2020-08-25 DIAGNOSIS — Z7984 Long term (current) use of oral hypoglycemic drugs: Secondary | ICD-10-CM | POA: Diagnosis not present

## 2020-08-25 DIAGNOSIS — Z8249 Family history of ischemic heart disease and other diseases of the circulatory system: Secondary | ICD-10-CM | POA: Insufficient documentation

## 2020-08-25 DIAGNOSIS — I10 Essential (primary) hypertension: Secondary | ICD-10-CM | POA: Diagnosis not present

## 2020-08-25 DIAGNOSIS — Z951 Presence of aortocoronary bypass graft: Secondary | ICD-10-CM | POA: Diagnosis not present

## 2020-08-25 DIAGNOSIS — Z7982 Long term (current) use of aspirin: Secondary | ICD-10-CM | POA: Diagnosis not present

## 2020-08-25 DIAGNOSIS — I25118 Atherosclerotic heart disease of native coronary artery with other forms of angina pectoris: Secondary | ICD-10-CM | POA: Diagnosis present

## 2020-08-25 HISTORY — PX: CORONARY STENT INTERVENTION: CATH118234

## 2020-08-25 HISTORY — PX: CORONARY PRESSURE/FFR STUDY: CATH118243

## 2020-08-25 HISTORY — PX: LEFT HEART CATH AND CORS/GRAFTS ANGIOGRAPHY: CATH118250

## 2020-08-25 LAB — BASIC METABOLIC PANEL
Anion gap: 7 (ref 5–15)
BUN: 18 mg/dL (ref 6–20)
CO2: 25 mmol/L (ref 22–32)
Calcium: 9.2 mg/dL (ref 8.9–10.3)
Chloride: 105 mmol/L (ref 98–111)
Creatinine, Ser: 1.26 mg/dL — ABNORMAL HIGH (ref 0.61–1.24)
GFR, Estimated: >60 mL/min (ref >60)
Glucose, Bld: 117 mg/dL — ABNORMAL HIGH (ref 70–99)
Potassium: 3.6 mmol/L (ref 3.5–5.1)
Sodium: 137 mmol/L (ref 135–145)

## 2020-08-25 LAB — CBC
HCT: 53.5 % — ABNORMAL HIGH (ref 39.0–52.0)
Hemoglobin: 17.7 g/dL — ABNORMAL HIGH (ref 13.0–17.0)
MCH: 29.5 pg (ref 26.0–34.0)
MCHC: 33.1 g/dL (ref 30.0–36.0)
MCV: 89.3 fL (ref 80.0–100.0)
Platelets: 241 10*3/uL (ref 150–400)
RBC: 5.99 MIL/uL — ABNORMAL HIGH (ref 4.22–5.81)
RDW: 14.9 % (ref 11.5–15.5)
WBC: 6.7 10*3/uL (ref 4.0–10.5)
nRBC: 0 % (ref 0.0–0.2)

## 2020-08-25 LAB — POCT ACTIVATED CLOTTING TIME: Activated Clotting Time: 380 seconds

## 2020-08-25 SURGERY — CORONARY STENT INTERVENTION
Anesthesia: LOCAL

## 2020-08-25 MED ORDER — SODIUM CHLORIDE 0.9 % WEIGHT BASED INFUSION: 1.0000 mL/kg/h | INTRAVENOUS | Status: DC

## 2020-08-25 MED ORDER — LIDOCAINE HCL (PF) 1 % IJ SOLN
INTRAMUSCULAR | Status: AC
  Filled 2020-08-25: qty 30

## 2020-08-25 MED ORDER — ADENOSINE (DIAGNOSTIC) 140MCG/KG/MIN
INTRAVENOUS | Status: DC | PRN
  Administered 2020-08-25: 140 ug/kg/min via INTRAVENOUS

## 2020-08-25 MED ORDER — ONDANSETRON HCL 4 MG/2ML IJ SOLN: 4.0000 mg | Freq: Four times a day (QID) | INTRAMUSCULAR | Status: DC | PRN

## 2020-08-25 MED ORDER — LABETALOL HCL 5 MG/ML IV SOLN: 10.0000 mg | INTRAVENOUS | Status: DC | PRN

## 2020-08-25 MED ORDER — BIVALIRUDIN BOLUS VIA INFUSION - CUPID
INTRAVENOUS | Status: DC | PRN
  Administered 2020-08-25: 88.125 mg via INTRAVENOUS

## 2020-08-25 MED ORDER — SODIUM CHLORIDE 0.9% FLUSH: 3.0000 mL | Freq: Two times a day (BID) | INTRAVENOUS | Status: DC

## 2020-08-25 MED ORDER — MIDAZOLAM HCL 2 MG/2ML IJ SOLN
INTRAMUSCULAR | Status: DC | PRN
  Administered 2020-08-25 (×2): 1 mg via INTRAVENOUS
  Administered 2020-08-25: 2 mg via INTRAVENOUS

## 2020-08-25 MED ORDER — IOHEXOL 350 MG/ML SOLN
INTRAVENOUS | Status: DC | PRN
  Administered 2020-08-25: 80 mL

## 2020-08-25 MED ORDER — FENTANYL CITRATE (PF) 100 MCG/2ML IJ SOLN
INTRAMUSCULAR | Status: DC | PRN
  Administered 2020-08-25: 25 ug via INTRAVENOUS
  Administered 2020-08-25: 50 ug via INTRAVENOUS
  Administered 2020-08-25: 25 ug via INTRAVENOUS

## 2020-08-25 MED ORDER — SODIUM CHLORIDE 0.9 % IV SOLN: 250.0000 mL | INTRAVENOUS | Status: DC | PRN

## 2020-08-25 MED ORDER — MIDAZOLAM HCL 2 MG/2ML IJ SOLN
INTRAMUSCULAR | Status: AC
  Filled 2020-08-25: qty 2

## 2020-08-25 MED ORDER — SODIUM CHLORIDE 0.9 % IV SOLN
INTRAVENOUS | Status: DC | PRN
  Administered 2020-08-25: 1.75 mg/kg/h via INTRAVENOUS

## 2020-08-25 MED ORDER — HYDRALAZINE HCL 20 MG/ML IJ SOLN: 10.0000 mg | INTRAMUSCULAR | Status: DC | PRN

## 2020-08-25 MED ORDER — SODIUM CHLORIDE 0.9 % WEIGHT BASED INFUSION
3.0000 mL/kg/h | INTRAVENOUS | Status: AC
  Administered 2020-08-25: 3 mL/kg/h via INTRAVENOUS

## 2020-08-25 MED ORDER — SODIUM CHLORIDE 0.9% FLUSH: 3.0000 mL | INTRAVENOUS | Status: DC | PRN

## 2020-08-25 MED ORDER — HEPARIN (PORCINE) IN NACL 1000-0.9 UT/500ML-% IV SOLN
INTRAVENOUS | Status: AC
  Filled 2020-08-25: qty 1000

## 2020-08-25 MED ORDER — CLOPIDOGREL BISULFATE 75 MG PO TABS: 75.0000 mg | ORAL_TABLET | ORAL | Status: DC

## 2020-08-25 MED ORDER — ADENOSINE 12 MG/4ML IV SOLN
INTRAVENOUS | Status: AC
  Filled 2020-08-25: qty 16

## 2020-08-25 MED ORDER — HEPARIN (PORCINE) IN NACL 1000-0.9 UT/500ML-% IV SOLN
INTRAVENOUS | Status: DC | PRN
  Administered 2020-08-25 (×2): 500 mL

## 2020-08-25 MED ORDER — BIVALIRUDIN TRIFLUOROACETATE 250 MG IV SOLR
INTRAVENOUS | Status: AC
  Filled 2020-08-25: qty 250

## 2020-08-25 MED ORDER — ASPIRIN 81 MG PO CHEW: 81.0000 mg | CHEWABLE_TABLET | ORAL | Status: DC

## 2020-08-25 MED ORDER — FENTANYL CITRATE (PF) 100 MCG/2ML IJ SOLN
INTRAMUSCULAR | Status: AC
  Filled 2020-08-25: qty 2

## 2020-08-25 MED ORDER — LIDOCAINE HCL (PF) 1 % IJ SOLN
INTRAMUSCULAR | Status: DC | PRN
  Administered 2020-08-25: 15 mL via INTRADERMAL
  Administered 2020-08-25: 10 mL via INTRADERMAL

## 2020-08-25 SURGICAL SUPPLY — 14 items
CATH INFINITI 5 FR IM (CATHETERS) ×2 IMPLANT
CATH INFINITI 5 FR LCB (CATHETERS) ×2 IMPLANT
CATH INFINITI 5FR MULTPACK ANG (CATHETERS) ×2 IMPLANT
CATH VISTA GUIDE 6FR JR4 (CATHETERS) ×2 IMPLANT
CLOSURE PERCLOSE PROSTYLE (VASCULAR PRODUCTS) ×4 IMPLANT
GUIDEWIRE PRESSURE X 175 (WIRE) ×2 IMPLANT
KIT HEART LEFT (KITS) ×2 IMPLANT
KIT MICROPUNCTURE NIT STIFF (SHEATH) ×2 IMPLANT
PACK CARDIAC CATHETERIZATION (CUSTOM PROCEDURE TRAY) ×2 IMPLANT
SHEATH PINNACLE 5F 10CM (SHEATH) ×4 IMPLANT
SHEATH PINNACLE 6F 10CM (SHEATH) ×2 IMPLANT
TRANSDUCER W/STOPCOCK (MISCELLANEOUS) ×2 IMPLANT
TUBING CIL FLEX 10 FLL-RA (TUBING) ×2 IMPLANT
WIRE EMERALD 3MM-J .035X150CM (WIRE) ×2 IMPLANT

## 2020-08-25 NOTE — Interval H&P Note (Signed)
Cath Lab Visit (complete for each Cath Lab visit)  Clinical Evaluation Leading to the Procedure:   ACS: No.  Non-ACS:    Anginal Classification: CCS II  Anti-ischemic medical therapy: Minimal Therapy (1 class of medications)  Non-Invasive Test Results: No non-invasive testing performed  Prior CABG: Previous CABG      History and Physical Interval Note:  08/25/2020 8:35 AM  Oscar [name]  has presented today for surgery, with the diagnosis of cad.  The various methods of treatment have been discussed with the patient and family. After consideration of risks, benefits and other options for treatment, the patient has consented to  Procedure(s): CORONARY STENT INTERVENTION (N/A) as a surgical intervention.  The patient's history has been reviewed, patient examined, no change in status, stable for surgery.  I have reviewed the patient's chart and labs.  Questions were answered to the patient's satisfaction.     Sherren Mocha

## 2020-08-25 NOTE — Discharge Instructions (Signed)
Angiogram, Care After Fri/ sat take it easy Drive Sunday Sun/mom/tue 10 lb limit Increase fluids to flush fidneys//hold metformin restart Monday   This sheet gives you information about how to care for yourself after your procedure. Your health care provider may also give you more specific instructions. If you have problems or questions, contact your health care provider. What can I expect after the procedure? After the procedure, it is common to have:  Bruising and tenderness at the catheter insertion area.  A collection of blood (hematoma) at the insertion area. This may feel like a small lump under the skin at the insertion site. Follow these instructions at home: Insertion site care  Follow instructions from your health care provider about how to take care of your insertion site. Make sure you: ? Wash your hands with soap and water before and after you change your bandage (dressing). If soap and water are not available, use hand sanitizer. ? Change your dressing as told by your health care provider.  Do not take baths, swim, or use a hot tub until your health care provider approves.  You may shower 24-48 hours after the procedure, or as told by your health care provider. To clean the insertion site: ? Gently wash the area with plain soap and water. ? Pat the area dry with a clean towel. ? Do not rub the site. This may cause bleeding.  Check your insertion site every day for signs of infection. Check for: ? Redness, swelling, or pain. ? Fluid or blood. ? Warmth. ? Pus or a bad smell.  Do not apply powder or lotion to the site. Keep the site clean and dry.   Activity  Do not drive for 24 hours if you were given a sedative during your procedure.  Rest as told by your health care provider, usually for 1-2 days.  Do not lift anything that is heavier than 10 lb (4.5 kg), or the limit that you are told, until your health care provider says that it is safe.  If the insertion  site was in your leg, try to avoid stairs for a few days.  Return to your normal activities as told by your health care provider, usually in about a week. Ask your health care provider what activities are safe for you. General instructions  If your insertion site starts bleeding, lie flat and put pressure on the site. If the bleeding does not stop, get help right away. This is a medical emergency.  Take over-the-counter and prescription medicines only as told by your health care provider.  Drink enough fluid to keep your urine pale yellow. This helps flush the contrast dye from your body.  Keep all follow-up visits as told by your health care provider. This is important.   Contact a health care provider if:  You have a fever or chills.  You have redness, swelling, or pain around your insertion site.  You have fluid or blood coming from your insertion site.  Your insertion site feels warm to the touch.  You have pus or a bad smell coming from your insertion site.  You have more bruising around the insertion site. Get help right away if you have:  A problem with the insertion area, such as: ? The area swells fast or bleeds even after you apply pressure. ? The area becomes pale, cool, tingly, or numb.  Chest pain.  Trouble breathing.  A rash.  Any symptoms of a stroke. "BE FAST" is an  easy way to remember the main warning signs of a stroke: ? B - Balance. Signs are dizziness, sudden trouble walking, or loss of balance. ? E - Eyes. Signs are trouble seeing or a sudden change in vision. ? F - Face. Signs are sudden weakness or loss of feeling of the face, or the face or eyelid drooping on one side. ? A - Arms. Signs are weakness or loss of feeling in an arm. This happens suddenly and usually on one side of the body. ? S - Speech. Signs are sudden trouble speaking, slurred speech, or trouble understanding what people say. ? T - Time. Time to call emergency services. Write down  what time symptoms started.  You have other signs of a stroke, such as: ? A sudden, severe headache with no known cause. ? Nausea or vomiting. ? Seizure. These symptoms may represent a serious problem that is an emergency. Do not wait to see if the symptoms will go away. Get medical help right away. Call your local emergency services (911 in the [place] Do not drive yourself to the hospital. Summary  It is common to have bruising and tenderness at the catheter insertion area.  Do not take baths, swim, or use a hot tub until your health care provider approves. You may shower 24-48 hours after the procedure or as told.  It is important to rest and drink plenty of fluids.  If the insertion site bleeds, lie flat and put pressure on the site. If the bleeding continues, get help right away. This is a medical emergency. This information is not intended to replace advice given to you by your health care provider. Make sure you discuss any questions you have with your health care provider. Document Revised: 01/20/2019 Document Reviewed: 01/20/2019 Elsevier Patient Education  [employer].

## 2020-08-25 NOTE — Progress Notes (Signed)
VAST consulted to obtain IV access in each arm for cardiac cath. While at bedside assessing patient's arm for 2nd IV, MD called and stated to bring patient to lab without right arm access.

## 2020-10-25 ENCOUNTER — Other Ambulatory Visit: Payer: Self-pay

## 2020-10-25 MED ORDER — REPATHA SURECLICK 140 MG/ML ~~LOC~~ SOAJ: 140.0000 mg | SUBCUTANEOUS | 11 refills | Status: DC

## 2020-10-25 MED ORDER — REPATHA SURECLICK 140 MG/ML ~~LOC~~ SOAJ
140.0000 mg | SUBCUTANEOUS | 11 refills | Status: DC
Start: 2020-10-25 — End: 2021-04-10

## 2020-10-25 NOTE — Telephone Encounter (Signed)
Called and spoke w/pharmacy to get the cost of the repatha sureclick 1 per 21 days as requested by dr Aundra Dubin that I sent in and they stated that the cost was $500 plus so I will get him a copay card successful activation and called in into pharmacy: RxBin: [id number] RxPCN: [place]: [id number] ID: [id number]

## 2021-01-22 ENCOUNTER — Other Ambulatory Visit (HOSPITAL_COMMUNITY): Payer: BC Managed Care – PPO

## 2021-02-05 ENCOUNTER — Ambulatory Visit (HOSPITAL_COMMUNITY)
Admission: RE | Admit: 2021-02-05 | Discharge: 2021-02-05 | Disposition: A | Discharge status: Home / Self Care | Payer: BC Managed Care – PPO | Source: Ambulatory Visit | Attending: Internal Medicine | Admitting: Internal Medicine

## 2021-02-05 ENCOUNTER — Other Ambulatory Visit: Payer: Self-pay

## 2021-02-05 DIAGNOSIS — E785 Hyperlipidemia, unspecified: Secondary | ICD-10-CM | POA: Diagnosis not present

## 2021-02-05 DIAGNOSIS — E1169 Type 2 diabetes mellitus with other specified complication: Secondary | ICD-10-CM | POA: Insufficient documentation

## 2021-02-05 DIAGNOSIS — R931 Abnormal findings on diagnostic imaging of heart and coronary circulation: Secondary | ICD-10-CM | POA: Insufficient documentation

## 2021-02-05 LAB — LIPID PANEL
Cholesterol: 75 mg/dL (ref 0–200)
HDL: 35 mg/dL — ABNORMAL LOW (ref >40)
LDL Cholesterol: 26 mg/dL (ref 0–99)
Total CHOL/HDL Ratio: 2.1 RATIO
Triglycerides: 71 mg/dL (ref <150)
VLDL: 14 mg/dL (ref 0–40)

## 2021-04-10 ENCOUNTER — Ambulatory Visit (HOSPITAL_COMMUNITY)
Admission: RE | Admit: 2021-04-10 | Discharge: 2021-04-10 | Disposition: A | Discharge status: Home / Self Care | Payer: BC Managed Care – PPO | Source: Ambulatory Visit | Attending: Cardiology | Admitting: Cardiology

## 2021-04-10 ENCOUNTER — Encounter (HOSPITAL_COMMUNITY): Payer: Self-pay | Admitting: Cardiology

## 2021-04-10 ENCOUNTER — Other Ambulatory Visit: Payer: Self-pay

## 2021-04-10 ENCOUNTER — Telehealth: Payer: Self-pay | Admitting: Pharmacist

## 2021-04-10 VITALS — BP 140/88 | HR 96 | Wt 257.0 lb

## 2021-04-10 DIAGNOSIS — I2510 Atherosclerotic heart disease of native coronary artery without angina pectoris: Secondary | ICD-10-CM | POA: Insufficient documentation

## 2021-04-10 DIAGNOSIS — I10 Essential (primary) hypertension: Secondary | ICD-10-CM | POA: Insufficient documentation

## 2021-04-10 DIAGNOSIS — E785 Hyperlipidemia, unspecified: Secondary | ICD-10-CM | POA: Diagnosis not present

## 2021-04-10 DIAGNOSIS — E119 Type 2 diabetes mellitus without complications: Secondary | ICD-10-CM | POA: Insufficient documentation

## 2021-04-10 DIAGNOSIS — Z8249 Family history of ischemic heart disease and other diseases of the circulatory system: Secondary | ICD-10-CM | POA: Diagnosis not present

## 2021-04-10 DIAGNOSIS — Z7985 Long-term (current) use of injectable non-insulin antidiabetic drugs: Secondary | ICD-10-CM | POA: Diagnosis not present

## 2021-04-10 DIAGNOSIS — Z7982 Long term (current) use of aspirin: Secondary | ICD-10-CM | POA: Insufficient documentation

## 2021-04-10 DIAGNOSIS — Z7984 Long term (current) use of oral hypoglycemic drugs: Secondary | ICD-10-CM | POA: Diagnosis not present

## 2021-04-10 DIAGNOSIS — Z79899 Other long term (current) drug therapy: Secondary | ICD-10-CM | POA: Insufficient documentation

## 2021-04-10 DIAGNOSIS — E1169 Type 2 diabetes mellitus with other specified complication: Secondary | ICD-10-CM | POA: Diagnosis not present

## 2021-04-10 DIAGNOSIS — I252 Old myocardial infarction: Secondary | ICD-10-CM | POA: Insufficient documentation

## 2021-04-10 DIAGNOSIS — Z951 Presence of aortocoronary bypass graft: Secondary | ICD-10-CM | POA: Insufficient documentation

## 2021-04-10 MED ORDER — REPATHA SURECLICK 140 MG/ML ~~LOC~~ SOAJ: 140.0000 mg | SUBCUTANEOUS | 11 refills | Status: DC

## 2021-04-10 NOTE — Progress Notes (Signed)
PCP: Dr. Melford Aase Cardiology: Dr. Aundra Dubin  61 y.o. with history of CAD s/p CABG was self-referred for evaluation of CAD.  Patient has a very strong family history of premature CAD.  He was in his usual state of health until 7/20 when he presented to the ER with chest pain and was found to have NSTEMI.  Cath showed 3 vessel disease, patient was taken for CABG x 4 by Dr. Orvan Seen.  Post-op, he had brief atrial fibrillation that resolved quickly.  No further atrial fibrillation has been seen.  Echo in 7/20 showed EF 60-65%.  Cardiac MRI in 6/21 at [facility] was normal.  In 5/22, patient had LHC that showed atretic RIMA-RCA with moderate RCA disease.  However, FFR and RFR of the RCA were negative, so he was medically managed.   Patient returns for followup of CAD.  He has been doing well in general.  He works out 4-5 days/week with weights and also cardio.  No exertional dyspnea or chest pain, very good exercise tolerance.  Was able to hike in [place] recently with no problems.  BP is elevated today in the office but checks daily at home and SBP always < 130.  He was started on semaglutide recently but has not lost weight. He has been taking Repatha every 3 wks.   ECG (personally reviewed): NSR, 1st degree AVB, LVH with repolarization abnormality.    Labs (3/22): creatinine 1.3, LDL 16, HDL 28.  Labs (12/22): LDL 68, TGs 149, K 4, creatinine 1.2, hgbA1c 5.8  PMH: 1. CAD: NSTEMI in 7/20 with cath showing 3 vessel disease.  - CABG (7/20) with LIMA-LAD, RIMA-PDA, SVG-diagonal, radial-OM.  - Echo (7/20): EF 60-65%, normal RV.  - Stress echo (6/21): EF >55%, no ischemia.  - LHC (5/22): RIMA-RCA atretic with 65% pRCA and 65% PDA stenosis, 99% mLCx with patent radial-OM3, 70% D1 with patent SVG-D1, LIMA-LAD patent with 90% pLAD.   Negative FFR and RFR of RCA, medical management.  2. Atrial fibrillation: Paroxysmal, only noted briefly post-op CABG.  3. Hyperlipidemia 4. HTN 5. PUD: GI bleeding post-MI.  6. S/p  appendectomy 7. Multiple abdominal ventral hernia surgeries.  8. Type 2 diabetes  FH: Premature CAD.  Mother with MI in her 55s, brother with Olar, another brother with CABG, father died with cardiac arrest.   SH: Nonsmoker.  Married with children, lives in [place].  Owns [employer].   ROS: All systems reviewed and negative except as per HPI.   Current Outpatient Medications  Medication Sig Dispense Refill   acidophilus (RISAQUAD) CAPS capsule Take 1 capsule by mouth 2 (two) times daily.     ALPRAZolam (XANAX) 1 MG tablet Take 1 mg by mouth at bedtime.     amLODipine (NORVASC) 2.5 MG tablet Take 2.5 mg by mouth at bedtime.     anastrozole (ARIMIDEX) 1 MG tablet Take 5 mg by mouth 2 (two) times a week.     Ascorbic Acid (VITAMIN C CR) 1000 MG TBCR Take 1,000 mg by mouth 2 (two) times daily.     aspirin EC 81 MG tablet Take 1 tablet (81 mg total) by mouth daily.     cetirizine (ZYRTEC) 10 MG tablet Take 10 mg by mouth daily as needed for allergies.     Cholecalciferol (DIALYVITE VITAMIN D 5000 PO) Take 5,000 mg by mouth daily.     Cyanocobalamin (VITAMIN B 12 PO) Take 1 tablet by mouth daily.     hydrochlorothiazide (MICROZIDE) 12.5 MG capsule Take 12.5  mg by mouth daily.     metFORMIN (GLUCOPHAGE) 1000 MG tablet Take 1,000 mg by mouth 2 (two) times daily.     Nutritional Supplements (DHEA PO) Take 35 mg by mouth daily in the afternoon.     Omega-3 Fatty Acids (FISH OIL) 1000 MG CAPS Take 1,000 mg by mouth daily in the afternoon.     OVER THE COUNTER MEDICATION Take 1 tablet by mouth daily. Primal defense probiotic tablet     rosuvastatin (CRESTOR) 5 MG tablet Take 5 mg by mouth daily.     Evolocumab (REPATHA SURECLICK) 242 MG/ML SOAJ Inject 140 mg into the skin every 14 (fourteen) days. 2 mL 11   No current facility-administered medications for this encounter.   BP 140/88    Pulse 96    Wt 116.6 kg (257 lb)    SpO2 97%    BMI 33.00 kg/m  General: NAD Neck: No  JVD, no thyromegaly or thyroid nodule.  Lungs: Clear to auscultation bilaterally with normal respiratory effort. CV: Nondisplaced PMI.  Heart regular S1/S2, no S3/S4, no murmur.  No peripheral edema.  No carotid bruit.  Normal pedal pulses.  Abdomen: Soft, nontender, no hepatosplenomegaly, no distention.  Skin: Intact without lesions or rashes.  Neurologic: Alert and oriented x 3.  Psych: Normal affect. Extremities: No clubbing or cyanosis.  HEENT: Normal.   Assessment/Plan: 1.  CAD: NSTEMI in 7/20 with CABG x 4.  He has a very strong family history of CAD.  He is a nonsmoker.  ETT-echo in 6/21 was normal.  LHC in 5/22 showed atretic RIMA-RCA and moderate disease in RCA that was not hemodynamically significant by FFR or RFR.  He was medically managed.  If RCA disease were to worsen in the future, it is possible that RIMA flow may improve to compensate.  No exertional symptoms.   - Continue ASA 81 daily.  - Continue management with Crestor 5 and Repatha.  2. HTN: BP is well-controlled on his current regimen. SBP < 130 when he checks daily at home.  3. Hyperlipidemia: LDL up to 68 taking Repatha to every 3 wks and Crestor 5 mg daily. I would at least like to see LDL < 55.  - I will have him increase Repatha back to every 2 wks. Lipids in 2 months.  4. Type 2 diabetes: He is on metformin and has been started on semaglutide. No significant weight loss yet by semaglutide.  He is getting this from an MD in [facility], would like to manage locally.  - I will refer to [facility] to manage his semaglutide.   Followup in 6 months.   Loralie Champagne 04/10/2021

## 2021-04-10 NOTE — Patient Instructions (Signed)
Medication Changes:  Repeatha changed to every 14 days  Lab Work:  No labs today. Recheck lipids in 2 months  Testing/Procedures:  nonoe  Referrals:  You have been referred to [facility] for semaglatide, they will call you with an appointment.   Special Instructions // Education:  none  Follow-Up in: 6 months   At the [facility], you and your health needs are our priority. We have a designated team specialized in the treatment of Heart Failure. This Care Team includes your primary Heart Failure Specialized Cardiologist (physician), Advanced Practice Providers (APPs- Physician Assistants and Nurse Practitioners), and Pharmacist who all work together to provide you with the care you need, when you need it.   You may see any of the following providers on your designated Care Team at your next follow up:  Dr Glori Bickers Dr Haynes Kerns, NP Lyda Jester, [place] [facility] [place], [place] Audry Riles, PharmD   Please be sure to bring in all your medications bottles to every appointment.   Need to Contact us:  If you have any questions or concerns before your next appointment please send [place] a message through [place] or call our office at [phone].    TO LEAVE A MESSAGE FOR THE NURSE SELECT OPTION 2, PLEASE LEAVE A MESSAGE INCLUDING: YOUR NAME DATE OF BIRTH CALL BACK NUMBER REASON FOR CALL**this is important as we prioritize the call backs  YOU WILL RECEIVE A CALL BACK THE SAME DAY AS LONG AS YOU CALL BEFORE 4:00 PM

## 2021-04-10 NOTE — Telephone Encounter (Addendum)
Pt referred by Dr Aundra Dubin to initiate [id number] therapy for weight loss. Will submit prior authorization request for [facility] and then contact pt to discuss once determination is made.

## 2021-04-11 DIAGNOSIS — R7303 Prediabetes: Secondary | ICD-10-CM | POA: Diagnosis not present

## 2021-04-11 DIAGNOSIS — F5101 Primary insomnia: Secondary | ICD-10-CM | POA: Diagnosis not present

## 2021-04-11 DIAGNOSIS — I2510 Atherosclerotic heart disease of native coronary artery without angina pectoris: Secondary | ICD-10-CM | POA: Diagnosis not present

## 2021-04-11 DIAGNOSIS — M79641 Pain in right hand: Secondary | ICD-10-CM | POA: Diagnosis not present

## 2021-04-12 NOTE — Telephone Encounter (Signed)
Wegovy denied - [employer] does not cover weight loss medications. Will try submitting for Ozempic. Pt does have prediabetes with A1c of 6%. Also has hx of CAD s/p CABG so would receive CV benefit from GLP therapy as well weight loss benefit.

## 2021-04-16 ENCOUNTER — Encounter: Payer: Self-pay | Admitting: Pharmacist

## 2021-04-16 MED ORDER — OZEMPIC (2 MG/DOSE) 8 MG/3ML ~~LOC~~ SOPN: 2.0000 mg | PEN_INJECTOR | SUBCUTANEOUS | 11 refills | Status: DC

## 2021-04-16 NOTE — Telephone Encounter (Signed)
Ozempic prior authorization also denied since pt does not have diagnosis of DM, prediabetes does not count for coverage with his plan.  Discussed with pt that his insurance does not cover weight loss medication but if he changes to a different commercial plan other than BCBS which has not been covering [facility] for any patients, could try resubmitting for coverage. If he also develops a dx of DM, then could also resubmit for Ozempic coverage.  Pt states he has been on metformin for 8 years due to preDM. Even with being on metformin 1g BID his A1c is 6. Therefore he would have A1c in diabetic range if he stopped metformin however this is not clinically appropriate to do just to get a lab value for his insurance. Therefore can clinically say that pt has DM. All of his immediate family has died from adult onset DM or heart disease (all siblings and both parents deceased). He is very interested in preventative medicine and wants to take semaglutide to decrease his A1c and cardiac risk.  He has actually already been taking semaglutide. His MD in [facility] (Dr Tamala Julian with [facility]) prescribed him a vial of semaglutide that he paid $850 cash for and he has been reconstituting it himself. He has already titrated up the dose and is currently taking 1.5mg  once weekly. He has not seen much weight loss (2.5 lbs in 2 months) but states that he will continue taking semaglutide no matter what (if insurance does or doesn't cover) for the cardiac and DM benefit.  Tried resubmitting PA for higher 2mg  maintenance dose with this information and request was approved through 04/15/22. Rx sent to pharmacy along with activated $25/month copay card.

## 2021-06-07 ENCOUNTER — Other Ambulatory Visit: Payer: Self-pay

## 2021-06-07 ENCOUNTER — Ambulatory Visit (HOSPITAL_COMMUNITY)
Admission: RE | Admit: 2021-06-07 | Discharge: 2021-06-07 | Disposition: A | Discharge status: Home / Self Care | Payer: BC Managed Care – PPO | Source: Ambulatory Visit | Attending: Cardiology | Admitting: Cardiology

## 2021-06-07 DIAGNOSIS — E1169 Type 2 diabetes mellitus with other specified complication: Secondary | ICD-10-CM | POA: Insufficient documentation

## 2021-06-07 DIAGNOSIS — E785 Hyperlipidemia, unspecified: Secondary | ICD-10-CM | POA: Insufficient documentation

## 2021-06-07 LAB — LIPID PANEL
Cholesterol: 73 mg/dL (ref 0–200)
HDL: 33 mg/dL — ABNORMAL LOW (ref >40)
LDL Cholesterol: 26 mg/dL (ref 0–99)
Total CHOL/HDL Ratio: 2.2 RATIO
Triglycerides: 71 mg/dL (ref <150)
VLDL: 14 mg/dL (ref 0–40)

## 2021-10-16 DIAGNOSIS — I10 Essential (primary) hypertension: Secondary | ICD-10-CM | POA: Diagnosis not present

## 2021-10-16 DIAGNOSIS — F5101 Primary insomnia: Secondary | ICD-10-CM | POA: Diagnosis not present

## 2021-10-16 DIAGNOSIS — I2510 Atherosclerotic heart disease of native coronary artery without angina pectoris: Secondary | ICD-10-CM | POA: Diagnosis not present

## 2021-10-16 DIAGNOSIS — E785 Hyperlipidemia, unspecified: Secondary | ICD-10-CM | POA: Diagnosis not present

## 2021-12-25 ENCOUNTER — Telehealth: Payer: BC Managed Care – PPO | Admitting: Physician Assistant

## 2021-12-25 DIAGNOSIS — U071 COVID-19: Secondary | ICD-10-CM

## 2021-12-25 MED ORDER — MOLNUPIRAVIR EUA 200MG CAPSULE: 4.0000 | ORAL_CAPSULE | Freq: Two times a day (BID) | ORAL | 0 refills | Status: AC

## 2021-12-25 NOTE — Patient Instructions (Addendum)
Oscar [name], thank you for joining Leeanne Rio, PA-C for today's virtual visit.  While this provider is not your primary care provider (PCP), if your PCP is located in our provider database this encounter information will be shared with them immediately following your visit.  Consent: (Patient) Oscar [name] provided verbal consent for this virtual visit at the beginning of the encounter.  Current Medications:  Current Outpatient Medications:    acidophilus (RISAQUAD) CAPS capsule, Take 1 capsule by mouth 2 (two) times daily., Disp: , Rfl:    ALPRAZolam (XANAX) 1 MG tablet, Take 1 mg by mouth at bedtime., Disp: , Rfl:    amLODipine (NORVASC) 2.5 MG tablet, Take 2.5 mg by mouth at bedtime., Disp: , Rfl:    anastrozole (ARIMIDEX) 1 MG tablet, Take 5 mg by mouth 2 (two) times a week., Disp: , Rfl:    Ascorbic Acid (VITAMIN C CR) 1000 MG TBCR, Take 1,000 mg by mouth 2 (two) times daily., Disp: , Rfl:    aspirin EC 81 MG tablet, Take 1 tablet (81 mg total) by mouth daily., Disp: , Rfl:    cetirizine (ZYRTEC) 10 MG tablet, Take 10 mg by mouth daily as needed for allergies., Disp: , Rfl:    Cholecalciferol (DIALYVITE VITAMIN D 5000 PO), Take 5,000 mg by mouth daily., Disp: , Rfl:    Cyanocobalamin (VITAMIN B 12 PO), Take 1 tablet by mouth daily., Disp: , Rfl:    Evolocumab (REPATHA SURECLICK) 810 MG/ML SOAJ, Inject 140 mg into the skin every 14 (fourteen) days., Disp: 2 mL, Rfl: 11   hydrochlorothiazide (MICROZIDE) 12.5 MG capsule, Take 12.5 mg by mouth daily., Disp: , Rfl:    metFORMIN (GLUCOPHAGE) 1000 MG tablet, Take 1,000 mg by mouth 2 (two) times daily., Disp: , Rfl:    Nutritional Supplements (DHEA PO), Take 35 mg by mouth daily in the afternoon., Disp: , Rfl:    Omega-3 Fatty Acids (FISH OIL) 1000 MG CAPS, Take 1,000 mg by mouth daily in the afternoon., Disp: , Rfl:    OVER THE COUNTER MEDICATION, Take 1 tablet by mouth daily. Primal defense probiotic tablet, Disp: , Rfl:     rosuvastatin (CRESTOR) 5 MG tablet, Take 5 mg by mouth daily., Disp: , Rfl:    Semaglutide, 2 MG/DOSE, (OZEMPIC, 2 MG/DOSE,) 8 MG/3ML SOPN, Inject 2 mg into the skin once a week., Disp: 3 mL, Rfl: 11   Medications ordered in this encounter:  No orders of the defined types were placed in this encounter.   *If you need refills on other medications prior to your next appointment, please contact your pharmacy*  Follow-Up: Call back or seek an in-person evaluation if the symptoms worsen or if the condition fails to improve as anticipated.  [facility] [phone]  Other Instructions Please keep well-hydrated and get plenty of rest. Start a saline nasal rinse to flush out your nasal passages. You can use plain Mucinex to help thin congestion. If you have a humidifier, running in the bedroom at night. I want you to start OTC vitamin D3 1000 units daily, vitamin C 1000 mg daily, and a zinc supplement. Please take prescribed medications as directed.  You have been enrolled in a MyChart symptom monitoring program. Please answer these questions daily so we can keep track of how you are doing.  You were to quarantine for 5 days from onset of your symptoms.  After day 5, if you have had no fever and you are feeling better,  you can end quarantine but need to mask for an additional 5 days. After day 5 if you have a fever or are having significant symptoms, please quarantine for full 10 days.  If you note any worsening of symptoms, any significant shortness of breath or any chest pain, please seek ER evaluation ASAP.  Please do not delay care!  COVID-19: What to Do if You Are Sick If you test positive and are an older adult or someone who is at high risk of getting very sick from COVID-19, treatment may be available. Contact a healthcare provider right away after a positive test to determine if you are eligible, even if your symptoms are mild right now. You can also visit a Test to  Treat location and, if eligible, receive a prescription from a provider. Don't delay: Treatment must be started within the first few days to be effective. If you have a fever, cough, or other symptoms, you might have COVID-19. Most people have mild illness and are able to recover at home. If you are sick: Keep track of your symptoms. If you have an emergency warning sign (including trouble breathing), call 911. Steps to help prevent the spread of COVID-19 if you are sick If you are sick with COVID-19 or think you might have COVID-19, follow the steps below to care for yourself and to help protect other people in your home and community. Stay home except to get medical care Stay home. Most people with COVID-19 have mild illness and can recover at home without medical care. Do not leave your home, except to get medical care. Do not visit public areas and do not go to places where you are unable to wear a mask. Take care of yourself. Get rest and stay hydrated. Take over-the-counter medicines, such as acetaminophen, to help you feel better. Stay in touch with your doctor. Call before you get medical care. Be sure to get care if you have trouble breathing, or have any other emergency warning signs, or if you think it is an emergency. Avoid public transportation, ride-sharing, or taxis if possible. Get tested If you have symptoms of COVID-19, get tested. While waiting for test results, stay away from others, including staying apart from those living in your household. Get tested as soon as possible after your symptoms start. Treatments may be available for people with COVID-19 who are at risk for becoming very sick. Don't delay: Treatment must be started early to be effective--some treatments must begin within 5 days of your first symptoms. Contact your healthcare provider right away if your test result is positive to determine if you are eligible. Self-tests are one of several options for testing for the  virus that causes COVID-19 and may be more convenient than laboratory-based tests and point-of-care tests. Ask your healthcare provider or your local health department if you need help interpreting your test results. You can visit your state, tribal, local, and territorial health department's website to look for the latest local information on testing sites. Separate yourself from other people As much as possible, stay in a specific room and away from other people and pets in your home. If possible, you should use a separate bathroom. If you need to be around other people or animals in or outside of the home, wear a well-fitting mask. Tell your close contacts that they may have been exposed to COVID-19. An infected person can spread COVID-19 starting 48 hours (or 2 days) before the person has any symptoms or tests  positive. By letting your close contacts know they may have been exposed to COVID-19, you are helping to protect everyone. See COVID-19 and Animals if you have questions about pets. If you are diagnosed with COVID-19, someone from the health department may call you. Answer the call to slow the spread. Monitor your symptoms Symptoms of COVID-19 include fever, cough, or other symptoms. Follow care instructions from your healthcare provider and local health department. Your local health authorities may give instructions on checking your symptoms and reporting information. When to seek emergency medical attention Look for emergency warning signs* for COVID-19. If someone is showing any of these signs, seek emergency medical care immediately: Trouble breathing Persistent pain or pressure in the chest New confusion Inability to wake or stay awake Pale, gray, or blue-colored skin, lips, or nail beds, depending on skin tone *This list is not all possible symptoms. Please call your medical provider for any other symptoms that are severe or concerning to you. Call 911 or call ahead to your local  emergency facility: Notify the operator that you are seeking care for someone who has or may have COVID-19. Call ahead before visiting your doctor Call ahead. Many medical visits for routine care are being postponed or done by phone or telemedicine. If you have a medical appointment that cannot be postponed, call your doctor's office, and tell them you have or may have COVID-19. This will help the office protect themselves and other patients. If you are sick, wear a well-fitting mask You should wear a mask if you must be around other people or animals, including pets (even at home). Wear a mask with the best fit, protection, and comfort for you. You don't need to wear the mask if you are alone. If you can't put on a mask (because of trouble breathing, for example), cover your coughs and sneezes in some other way. Try to stay at least 6 feet away from other people. This will help protect the people around you. Masks should not be placed on young children under age 53 years, anyone who has trouble breathing, or anyone who is not able to remove the mask without help. Cover your coughs and sneezes Cover your mouth and nose with a tissue when you cough or sneeze. Throw away used tissues in a lined trash can. Immediately wash your hands with soap and water for at least 20 seconds. If soap and water are not available, clean your hands with an alcohol-based hand sanitizer that contains at least 60% alcohol. Clean your hands often Wash your hands often with soap and water for at least 20 seconds. This is especially important after blowing your nose, coughing, or sneezing; going to the bathroom; and before eating or preparing food. Use hand sanitizer if soap and water are not available. Use an alcohol-based hand sanitizer with at least 60% alcohol, covering all surfaces of your hands and rubbing them together until they feel dry. Soap and water are the best option, especially if hands are visibly dirty. Avoid  touching your eyes, nose, and mouth with unwashed hands. Handwashing Tips Avoid sharing personal household items Do not share dishes, drinking glasses, cups, eating utensils, towels, or bedding with other people in your home. Wash these items thoroughly after using them with soap and water or put in the dishwasher. Clean surfaces in your home regularly Clean and disinfect high-touch surfaces (for example, doorknobs, tables, handles, light switches, and countertops) in your "sick room" and bathroom. In shared spaces, you should clean  and disinfect surfaces and items after each use by the person who is ill. If you are sick and cannot clean, a caregiver or other person should only clean and disinfect the area around you (such as your bedroom and bathroom) on an as needed basis. Your caregiver/other person should wait as long as possible (at least several hours) and wear a mask before entering, cleaning, and disinfecting shared spaces that you use. Clean and disinfect areas that may have blood, stool, or body fluids on them. Use household cleaners and disinfectants. Clean visible dirty surfaces with household cleaners containing soap or detergent. Then, use a household disinfectant. Use a product from [employer] List N: Disinfectants for Coronavirus (IHWTU-88). Be sure to follow the instructions on the label to ensure safe and effective use of the product. Many products recommend keeping the surface wet with a disinfectant for a certain period of time (look at "contact time" on the product label). You may also need to wear personal protective equipment, such as gloves, depending on the directions on the product label. Immediately after disinfecting, wash your hands with soap and water for 20 seconds. For completed guidance on cleaning and disinfecting your home, visit Complete Disinfection Guidance. Take steps to improve ventilation at home Improve ventilation (air flow) at home to help prevent from spreading  COVID-19 to other people in your household. Clear out COVID-19 virus particles in the air by opening windows, using air filters, and turning on fans in your home. Use this interactive tool to learn how to improve air flow in your home. When you can be around others after being sick with COVID-19 Deciding when you can be around others is different for different situations. Find out when you can safely end home isolation. For any additional questions about your care, contact your healthcare provider or state or local health department. 06/20/2020 Content source: [facility] for Immunization and Respiratory Diseases ([facility]), Division of Viral Diseases This information is not intended to replace advice given to you by your health care provider. Make sure you discuss any questions you have with your health care provider. Document Revised: 08/03/2020 Document Reviewed: 08/03/2020 Elsevier Patient Education  2022 [employer].    If you have been instructed to have an in-person evaluation today at a local Urgent Care facility, please use the link below. It will take you to a list of all of our available [place] [facility], including address, phone number and hours of operation. Please do not delay care.  [facility]  If you or a family member do not have a primary care provider, use the link below to schedule a visit and establish care. When you choose a [facility] primary care physician or advanced practice provider, you gain a long-term partner in health. Find a Primary Care Provider  Learn more about [facility]'s in-office and virtual care options: [facility]

## 2021-12-25 NOTE — Progress Notes (Signed)
Virtual Visit Consent   Carmin Dibartolo, you are scheduled for a virtual visit with a [facility] provider today. Just as with appointments in the office, your consent must be obtained to participate. Your consent will be active for this visit and any virtual visit you may have with one of our providers in the next 365 days. If you have a MyChart account, a copy of this consent can be sent to you electronically.  As this is a virtual visit, video technology does not allow for your provider to perform a traditional examination. This may limit your provider's ability to fully assess your condition. If your provider identifies any concerns that need to be evaluated in person or the need to arrange testing (such as labs, EKG, etc.), we will make arrangements to do so. Although advances in technology are sophisticated, we cannot ensure that it will always work on either your end or our end. If the connection with a video visit is poor, the visit may have to be switched to a telephone visit. With either a video or telephone visit, we are not always able to ensure that we have a secure connection.  By engaging in this virtual visit, you consent to the provision of healthcare and authorize for your insurance to be billed (if applicable) for the services provided during this visit. Depending on your insurance coverage, you may receive a charge related to this service.  I need to obtain your verbal consent now. Are you willing to proceed with your visit today? Marina Desire has provided verbal consent on 12/25/2021 for a virtual visit (video or telephone). Leeanne Rio, [place]  Date: 12/25/2021 2:23 PM  Virtual Visit via Video Note   I, Leeanne Rio, connected with  Thedore Mins  ([id number], 1961-01-05) on 12/25/21 at  2:15 PM EDT by a video-enabled telemedicine application and verified that I am speaking with the correct person using two identifiers.  Location: Patient: Virtual Visit Location  Patient: Home Provider: Virtual Visit Location Provider: Home Office   I discussed the limitations of evaluation and management by telemedicine and the availability of in person appointments. The patient expressed understanding and agreed to proceed.    History of Present Illness: Makya Yurko is a 61 y.o. who identifies as a male who was assigned male at birth, and is being seen today for COVID-19. Symptoms starting Saturday with mild congestion and scratchy throat. Worsened last night with chills, aches, increased congestion and fatigue. Known exposures so took home test that was positive.  Denies chest pain or SOB. Denies GI symptoms.   Risk factors: CVD, DM, HTN, HLD  HPI: HPI  Problems:  Patient Active Problem List   Diagnosis Date Noted   Orthopnea 12/17/2018   History of ST elevation myocardial infarction (STEMI) 12/17/2018   Erythrocytosis 11/25/2018   Schatzki's ring of distal esophagus    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 10/26/2018   Acute ST elevation myocardial infarction (STEMI) of inferolateral wall ([place]) - Aborted 10/21/2018   Diabetes mellitus type 2 with complications ([place]) 62/83/1517   CAD, multiple vessel 10/21/2018   Pain in joint of left elbow 04/30/2018   Pain of left calf 07/08/2017   Prolapsed internal hemorrhoids, grade 2 12/05/2016   Coronary artery disease involving native coronary artery with angina pectoris ([place]) 01/11/2016   Family history of premature CAD 01/11/2016   Ventral hernia 10/25/2014   Diverticular hemorrhage - recurrent 12/26/2012   Obesity (BMI 30-39.9) 04/17/2012  Incisional hernia, recurrent 02/24/2012   Rectus diastasis 02/24/2012   BACK PAIN, LUMBAR 11/16/2009   Hyperlipidemia associated with type 2 diabetes mellitus ([place]) 05/24/2008   Essential hypertension 05/24/2008   Asthma 05/24/2008   Sleep apnea 05/24/2008   NEPHROLITHIASIS, HX OF 05/24/2008    Allergies:  Allergies  Allergen Reactions    Acetaminophen Other (See Comments)    Bleeding issues   Ibuprofen Other (See Comments)    Bleeding issues   Advair Diskus [Fluticasone-Salmeterol] Cough   Latex Itching   Medications:  Current Outpatient Medications:    molnupiravir EUA (LAGEVRIO) 200 mg CAPS capsule, Take 4 capsules (800 mg total) by mouth 2 (two) times daily for 5 days., Disp: 40 capsule, Rfl: 0   acidophilus (RISAQUAD) CAPS capsule, Take 1 capsule by mouth 2 (two) times daily., Disp: , Rfl:    ALPRAZolam (XANAX) 1 MG tablet, Take 1 mg by mouth at bedtime., Disp: , Rfl:    amLODipine (NORVASC) 2.5 MG tablet, Take 2.5 mg by mouth at bedtime., Disp: , Rfl:    anastrozole (ARIMIDEX) 1 MG tablet, Take 5 mg by mouth 2 (two) times a week., Disp: , Rfl:    Ascorbic Acid (VITAMIN C CR) 1000 MG TBCR, Take 1,000 mg by mouth 2 (two) times daily., Disp: , Rfl:    aspirin EC 81 MG tablet, Take 1 tablet (81 mg total) by mouth daily., Disp: , Rfl:    cetirizine (ZYRTEC) 10 MG tablet, Take 10 mg by mouth daily as needed for allergies., Disp: , Rfl:    Cholecalciferol (DIALYVITE VITAMIN D 5000 PO), Take 5,000 mg by mouth daily., Disp: , Rfl:    Cyanocobalamin (VITAMIN B 12 PO), Take 1 tablet by mouth daily., Disp: , Rfl:    Evolocumab (REPATHA SURECLICK) 607 MG/ML SOAJ, Inject 140 mg into the skin every 14 (fourteen) days., Disp: 2 mL, Rfl: 11   hydrochlorothiazide (MICROZIDE) 12.5 MG capsule, Take 12.5 mg by mouth daily., Disp: , Rfl:    metFORMIN (GLUCOPHAGE) 1000 MG tablet, Take 1,000 mg by mouth 2 (two) times daily., Disp: , Rfl:    Nutritional Supplements (DHEA PO), Take 35 mg by mouth daily in the afternoon., Disp: , Rfl:    Omega-3 Fatty Acids (FISH OIL) 1000 MG CAPS, Take 1,000 mg by mouth daily in the afternoon., Disp: , Rfl:    OVER THE COUNTER MEDICATION, Take 1 tablet by mouth daily. Primal defense probiotic tablet, Disp: , Rfl:    rosuvastatin (CRESTOR) 5 MG tablet, Take 5 mg by mouth daily., Disp: , Rfl:    Semaglutide, 2  MG/DOSE, (OZEMPIC, 2 MG/DOSE,) 8 MG/3ML SOPN, Inject 2 mg into the skin once a week., Disp: 3 mL, Rfl: 11  Observations/Objective: Patient is well-developed, well-nourished in no acute distress.  Resting comfortably at home.  Head is normocephalic, atraumatic.  No labored breathing. Speech is clear and coherent with logical content.  Patient is alert and oriented at baseline.   Assessment and Plan: 1. COVID-19 - molnupiravir EUA (LAGEVRIO) 200 mg CAPS capsule; Take 4 capsules (800 mg total) by mouth 2 (two) times daily for 5 days.  Dispense: 40 capsule; Refill: 0  Patient with multiple risk factors for complicated course of illness. Discussed risks/benefits of antiviral medications including most common potential ADRs. Patient voiced understanding and would like to proceed with antiviral medication. They are candidate for Molnupiravir. Rx sent to pharmacy. Supportive measures, OTC medications and vitamin regimen reviewed. Patient has been enrolled in a MyChart COVID symptom monitoring  program. Samule Dry reviewed in detail. Strict ER precautions discussed with patient.    Follow Up Instructions: I discussed the assessment and treatment plan with the patient. The patient was provided an opportunity to ask questions and all were answered. The patient agreed with the plan and demonstrated an understanding of the instructions.  A copy of instructions were sent to the patient via MyChart unless otherwise noted below.   The patient was advised to call back or seek an in-person evaluation if the symptoms worsen or if the condition fails to improve as anticipated.  Time:  I spent 8 minutes with the patient via telehealth technology discussing the above problems/concerns.    Leeanne Rio, PA-C

## 2022-03-08 ENCOUNTER — Other Ambulatory Visit (HOSPITAL_COMMUNITY): Payer: Self-pay | Admitting: Cardiology

## 2022-03-14 ENCOUNTER — Encounter (HOSPITAL_COMMUNITY): Payer: Self-pay | Admitting: Cardiology

## 2022-03-14 ENCOUNTER — Other Ambulatory Visit: Payer: Self-pay | Admitting: Pharmacist

## 2022-03-14 ENCOUNTER — Ambulatory Visit (HOSPITAL_COMMUNITY)
Admission: RE | Admit: 2022-03-14 | Discharge: 2022-03-14 | Disposition: A | Discharge status: Home / Self Care | Payer: BC Managed Care – PPO | Source: Ambulatory Visit | Attending: Cardiology | Admitting: Cardiology

## 2022-03-14 VITALS — BP 150/80 | HR 96 | Wt 267.4 lb

## 2022-03-14 DIAGNOSIS — I10 Essential (primary) hypertension: Secondary | ICD-10-CM | POA: Diagnosis not present

## 2022-03-14 DIAGNOSIS — Z79899 Other long term (current) drug therapy: Secondary | ICD-10-CM | POA: Insufficient documentation

## 2022-03-14 DIAGNOSIS — Z951 Presence of aortocoronary bypass graft: Secondary | ICD-10-CM | POA: Insufficient documentation

## 2022-03-14 DIAGNOSIS — I214 Non-ST elevation (NSTEMI) myocardial infarction: Secondary | ICD-10-CM | POA: Diagnosis not present

## 2022-03-14 DIAGNOSIS — I480 Paroxysmal atrial fibrillation: Secondary | ICD-10-CM | POA: Insufficient documentation

## 2022-03-14 DIAGNOSIS — E119 Type 2 diabetes mellitus without complications: Secondary | ICD-10-CM | POA: Insufficient documentation

## 2022-03-14 DIAGNOSIS — Z8249 Family history of ischemic heart disease and other diseases of the circulatory system: Secondary | ICD-10-CM | POA: Diagnosis not present

## 2022-03-14 DIAGNOSIS — E785 Hyperlipidemia, unspecified: Secondary | ICD-10-CM | POA: Diagnosis not present

## 2022-03-14 DIAGNOSIS — I2510 Atherosclerotic heart disease of native coronary artery without angina pectoris: Secondary | ICD-10-CM | POA: Insufficient documentation

## 2022-03-14 DIAGNOSIS — Z7982 Long term (current) use of aspirin: Secondary | ICD-10-CM | POA: Insufficient documentation

## 2022-03-14 MED ORDER — OZEMPIC (1 MG/DOSE) 4 MG/3ML ~~LOC~~ SOPN: 1.0000 mg | PEN_INJECTOR | SUBCUTANEOUS | 5 refills | Status: DC

## 2022-03-14 NOTE — Patient Instructions (Signed)
There has been no changes to your medication.  Your physician recommends that you schedule a follow-up appointment in: 6 month ( June 2024) ** please call the office in April to arrange your follow up appointment **  If you have any questions or concerns before your next appointment please send [place] a message through [place] or call our office at [phone].    TO LEAVE A MESSAGE FOR THE NURSE SELECT OPTION 2, PLEASE LEAVE A MESSAGE INCLUDING: YOUR NAME DATE OF BIRTH CALL BACK NUMBER REASON FOR CALL**this is important as we prioritize the call backs  YOU WILL RECEIVE A CALL BACK THE SAME DAY AS LONG AS YOU CALL BEFORE 4:00 PM  At the [facility], you and your health needs are our priority. As part of our continuing mission to provide you with exceptional heart care, we have created designated Provider Care Teams. These Care Teams include your primary Cardiologist (physician) and Advanced Practice Providers (APPs- Physician Assistants and Nurse Practitioners) who all work together to provide you with the care you need, when you need it.   You may see any of the following providers on your designated Care Team at your next follow up: Dr Glori Bickers Dr Loralie Champagne Dr. Roxana Hires, NP Lyda Jester, [place] [facility] [place], [place] Forestine Na, NP Audry Riles, PharmD   Please be sure to bring in all your medications bottles to every appointment.

## 2022-03-17 NOTE — Progress Notes (Signed)
PCP: Dr. Melford Aase Cardiology: Dr. Aundra Dubin  61 y.o. with history of CAD s/p CABG was self-referred for evaluation of CAD.  Patient has a very strong family history of premature CAD.  He was in his usual state of health until 7/20 when he presented to the ER with chest pain and was found to have NSTEMI.  Cath showed 3 vessel disease, patient was taken for CABG x 4 by Dr. Orvan Seen.  Post-op, he had brief atrial fibrillation that resolved quickly.  No further atrial fibrillation has been seen.  Echo in 7/20 showed EF 60-65%.  Cardiac MRI in 6/21 at [facility] was normal.  In 5/22, patient had LHC that showed atretic RIMA-RCA with moderate RCA disease.  However, FFR and RFR of the RCA were negative, so he was medically managed.   Patient returns for followup of CAD.  He has chest fullness on occasion with emotional stress but not with physical activity.  He is very active, works out most days, [employer] and Engineer, structural.  Never gets chest pain with activity.  Using his CPAP every night.  No exertional dyspnea.  No palpitations.  No lightheadedness.  BP high in the office but checks regularly at home and SBP runs 110s-120s.  Weight is up today.   ECG (personally reviewed): NSR, arm lead reversal, LVH with repolarization abnormality.    Labs (3/22): creatinine 1.3, LDL 16, HDL 28.  Labs (12/22): LDL 68, TGs 149, K 4, creatinine 1.2, hgbA1c 5.8 Labs (3/23): LDL 26, TGs 71, HDL 33 Labs (10/23): LDL 16, hs-CRP 2.6  PMH: 1. CAD: NSTEMI in 7/20 with cath showing 3 vessel disease.  - CABG (7/20) with LIMA-LAD, RIMA-PDA, SVG-diagonal, radial-OM.  - Echo (7/20): EF 60-65%, normal RV.  - Stress echo (6/21): EF >55%, no ischemia.  - LHC (5/22): RIMA-RCA atretic with 65% pRCA and 65% PDA stenosis, 99% mLCx with patent radial-OM3, 70% D1 with patent SVG-D1, LIMA-LAD patent with 90% pLAD.   Negative FFR and RFR of RCA, medical management.  2. Atrial fibrillation: Paroxysmal, only noted briefly post-op CABG.  3.  Hyperlipidemia 4. HTN 5. PUD: GI bleeding post-MI.  6. S/p appendectomy 7. Multiple abdominal ventral hernia surgeries.  8. Type 2 diabetes 9. OSA: Uses CPAP  FH: Premature CAD.  Mother with MI in her 30s, brother with [place], another brother with CABG, father died with cardiac arrest.   SH: Nonsmoker.  Married with children, lives in [place].  Owns [employer].   ROS: All systems reviewed and negative except as per HPI.   Current Outpatient Medications  Medication Sig Dispense Refill   acidophilus (RISAQUAD) CAPS capsule Take 1 capsule by mouth 2 (two) times daily.     amLODipine (NORVASC) 2.5 MG tablet Take 2.5 mg by mouth at bedtime.     anastrozole (ARIMIDEX) 1 MG tablet Take 5 mg by mouth 2 (two) times a week.     Ascorbic Acid (VITAMIN C CR) 1000 MG TBCR Take 1,000 mg by mouth 2 (two) times daily.     aspirin EC 81 MG tablet Take 1 tablet (81 mg total) by mouth daily.     cetirizine (ZYRTEC) 10 MG tablet Take 10 mg by mouth daily as needed for allergies.     Cholecalciferol (DIALYVITE VITAMIN D 5000 PO) Take 5,000 mg by mouth daily.     Coenzyme Q10 (CO Q 10 PO) Take 1 tablet by mouth daily in the afternoon.     Cyanocobalamin (VITAMIN B 12 PO) Take 1 tablet by  mouth daily.     hydrochlorothiazide (MICROZIDE) 12.5 MG capsule Take 12.5 mg by mouth daily.     Magnesium 250 MG TABS Take 1 tablet by mouth daily in the afternoon.     metFORMIN (GLUCOPHAGE) 1000 MG tablet Take 1,000 mg by mouth 2 (two) times daily.     Nutritional Supplements (DHEA PO) Take 35 mg by mouth daily in the afternoon.     Omega-3 Fatty Acids (FISH OIL) 1000 MG CAPS Take 1,000 mg by mouth daily in the afternoon.     OVER THE COUNTER MEDICATION Take 1 tablet by mouth daily. Primal defense probiotic tablet     REPATHA SURECLICK 732 MG/ML SOAJ ADMINISTER 1 ML UNDER THE SKIN EVERY 14 DAYS 2 mL 11   rosuvastatin (CRESTOR) 5 MG tablet Take 5 mg by mouth daily.     Zinc 50 MG TABS Take 1 tablet  by mouth daily in the afternoon.     Semaglutide, 1 MG/DOSE, (OZEMPIC, 1 MG/DOSE,) 4 MG/3ML SOPN Inject 1 mg into the skin once a week. 3 mL 5   No current facility-administered medications for this encounter.   BP (!) 150/80   Pulse 96   Wt 121.3 kg (267 lb 6.4 oz)   SpO2 96%   BMI 34.33 kg/m  General: NAD Neck: No JVD, no thyromegaly or thyroid nodule.  Lungs: Clear to auscultation bilaterally with normal respiratory effort. CV: Nondisplaced PMI.  Heart regular S1/S2, no S3/S4, no murmur.  No peripheral edema.  No carotid bruit.  Normal pedal pulses.  Abdomen: Soft, nontender, no hepatosplenomegaly, no distention.  Skin: Intact without lesions or rashes.  Neurologic: Alert and oriented x 3.  Psych: Normal affect. Extremities: No clubbing or cyanosis.  HEENT: Normal.   Assessment/Plan: 1.  CAD: NSTEMI in 7/20 with CABG x 4.  He has a very strong family history of CAD.  He is a nonsmoker.  ETT-echo in 6/21 was normal.  LHC in 5/22 showed atretic RIMA-RCA and moderate disease in RCA that was not hemodynamically significant by FFR or RFR.  He was medically managed.  If RCA disease were to worsen in the future, it is possible that RIMA flow may improve to compensate.  No exertional symptoms.  - Continue ASA 81 daily.  - Continue management with Crestor 5 and Repatha.  - We discussed inflammatory pathway to CAD.  Colchicine may be a consideration for him if he does not want to take semaglutide in the future.  2. HTN: BP is well-controlled on his current regimen. SBP always < 130 when he checks daily at home.  3. Hyperlipidemia: LDL has been very low.  Hs-CRP is within the normal range.   - Continue current Crestor and Repatha.  4. Type 2 diabetes: He is on metformin and has been started on semaglutide. He wants to keep the dosage of semaglutide at 1 mg.  I will message pharmacy clinic.   Followup in 6 months.   Loralie Champagne 03/17/2022

## 2022-04-04 DIAGNOSIS — J209 Acute bronchitis, unspecified: Secondary | ICD-10-CM | POA: Diagnosis not present

## 2022-04-05 DIAGNOSIS — J40 Bronchitis, not specified as acute or chronic: Secondary | ICD-10-CM | POA: Diagnosis not present

## 2022-04-11 ENCOUNTER — Other Ambulatory Visit (HOSPITAL_COMMUNITY): Payer: Self-pay

## 2022-07-11 IMAGING — CT CT HEART MORP W/ CTA COR W/ SCORE W/ CA W/CM &/OR W/O CM
4 of 7 series · 8 of 20 positions shown, 9 images · IV contrast (APPLIED)
Comparison: None.

Addendum:
CLINICAL DATA: CAD Previous CABG Graft Patency

EXAM:
Cardiac CTA
MEDICATIONS:
Sub lingual nitro. 4mg and lopressor 100mg
TECHNIQUE: The patient was scanned on a Siemens Force [REDACTED]ice scanner. Gantry
rotation speed was 250 msecs. Collimation was .6 mm. A 100 kV
prospective scan was triggered in the ascending thoracic aorta at
140 HU's Full mA was used between 35% and 75% of the R-R interval.
Average HR during the scan was 69 bpm. The 3D data set was
interpreted on a dedicated work station using MPR, MIP and VRT
modes. A total of 80 cc of contrast was used.

[Series 6: best diast 70 % · axial · 0.47mm/px · z∈[-216,-171]mm · 2 of 338 slices shown, 3 images]
[im 113/338  vessel]
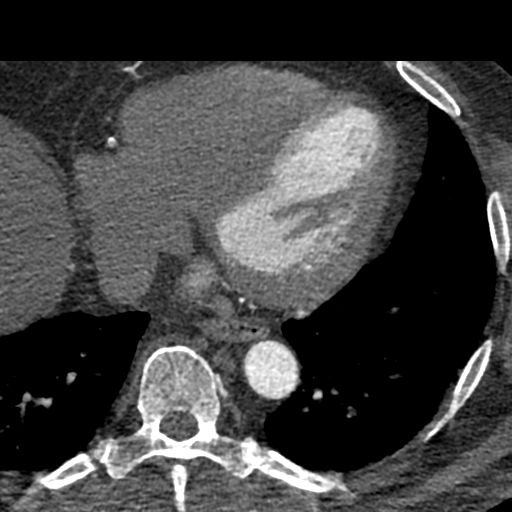
[im 113/338  lung]
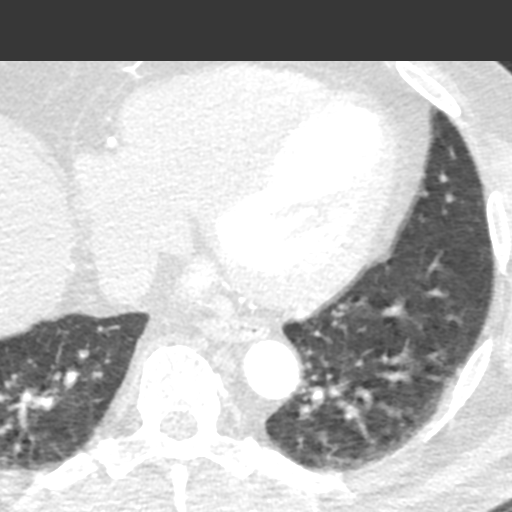
[im 225/338  vessel]
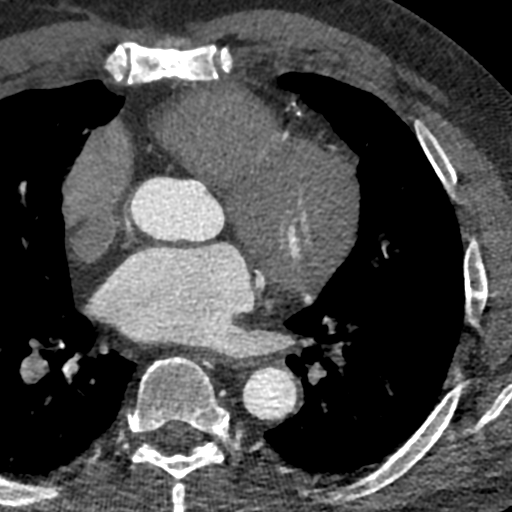

[Series 7: best syst 40 % · axial · 0.47mm/px · z∈[-216,-171]mm · 2 of 338 slices shown]
[im 113/338  vessel]
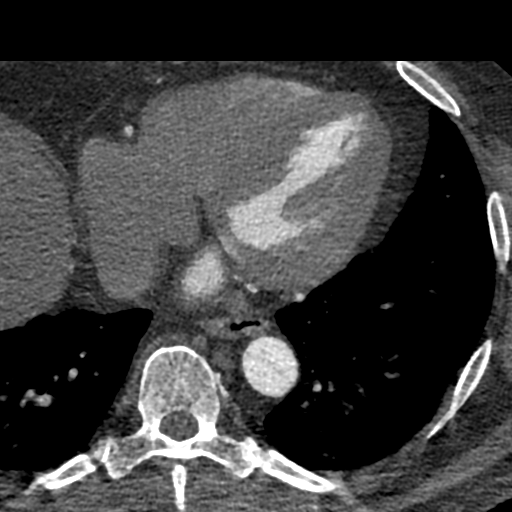
[im 225/338  vessel]
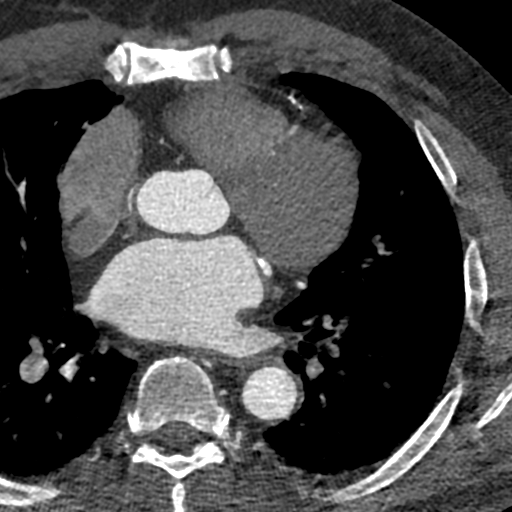

[Series 8: ts diast sharp 70 % · axial · 0.47mm/px · z∈[-216,-171]mm · 2 of 338 slices shown]
[im 113/338  lung]
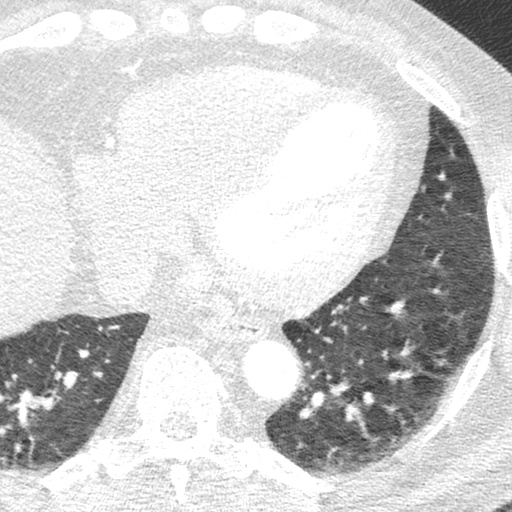
[im 225/338  lung]
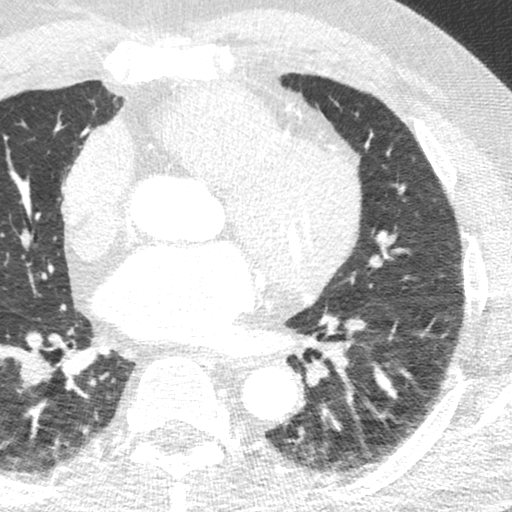

[Series 9: ts syst sharp 40 % · axial · 0.47mm/px · z∈[-216,-171]mm · 2 of 338 slices shown]
[im 113/338  lung]
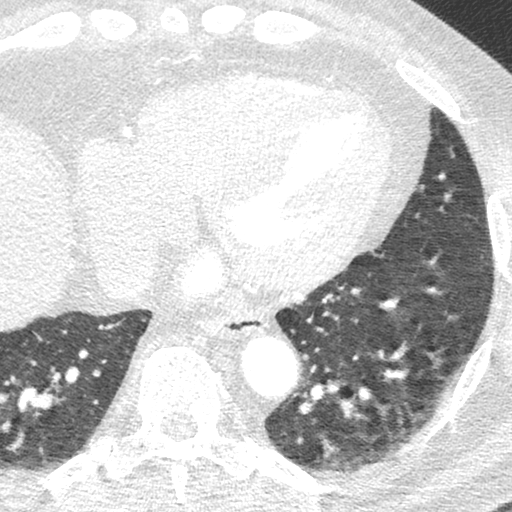
[im 225/338  lung]
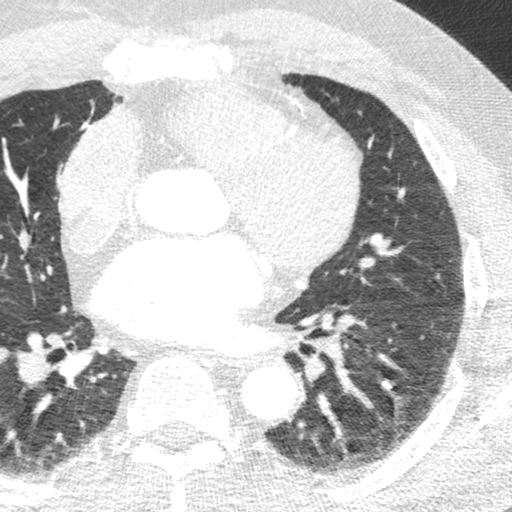

[8 of 20 positions shown; findings below may reference images not displayed]

FINDINGS: Non-cardiac: See separate report from [REDACTED]. No
significant findings on limited lung and soft tissue windows.

Calcium Score: 3 vessel coronary calcium noted

Patient is s/p CABG. He was supposed to be scanned from clavicles
down but was not. Misregistration and poor opacification of native
vessels

Appears to be high grade calcified proximal and distal RCA stenosis
High grade mixed plaque/stenosis in proximal LAD High grade stenosis
and calcified plaque

In the mid/distal circumflex and ostium OM2

The AZCONA to the Diagonal is patent

The AZCONA to the OM appears patent at the origin and seen in its
mid/distal anastomosis but there is

A short segment not visualized proximally due to the scan protocol

The [REDACTED] to LAD appears patent in the mid and distal segment into
the distal LAD but the pedicled proximal portion not visualized

The KLEVER to the distal RCA was not visualized
IMPRESSION: 1. Calcium score 3434 which is 99 th percentile for age/sex

2.  Severe native 3 vessel CAD see above

3. Patient will be brought back to be res-canned from the clavicles
down The AZCONA;Phil to the OM/Diagonal and the [REDACTED] to the LAD

Appear patent The KLEVER to the PDA is not visualized

4.  Normal ascending aortic root 3.5 cm

Discussed with CT tech, CT Navigator and Dr Polivalente

Blain Jumper

EXAM:
OVER-READ INTERPRETATION  CT CHEST

The following report is an over-read performed by radiologist Dr.
over-read does not include interpretation of cardiac or coronary
anatomy or pathology. The coronary calcium score and cardiac CTA
interpretation by the cardiologist is attached.
FINDINGS: Aortic atherosclerosis. Within the visualized portions of the thorax
there are no suspicious appearing pulmonary nodules or masses, there
is no acute consolidative airspace disease, no pleural effusions, no
pneumothorax and no lymphadenopathy. Visualized portions of the
upper abdomen are unremarkable. There are no aggressive appearing
lytic or blastic lesions noted in the visualized portions of the
skeleton. Status post median sternotomy for CABG.
IMPRESSION: 1.  Aortic Atherosclerosis ([id number]).

*** End of Addendum ***
FINDINGS: Non-cardiac: See separate report from [REDACTED]. No
significant findings on limited lung and soft tissue windows.

Calcium Score: 3 vessel coronary calcium noted

Patient is s/p CABG. He was supposed to be scanned from clavicles
down but was not. Misregistration and poor opacification of native
vessels

Appears to be high grade calcified proximal and distal RCA stenosis
High grade mixed plaque/stenosis in proximal LAD High grade stenosis
and calcified plaque

In the mid/distal circumflex and ostium OM2

The AZCONA to the Diagonal is patent

The AZCONA to the OM appears patent at the origin and seen in its
mid/distal anastomosis but there is

A short segment not visualized proximally due to the scan protocol

The [REDACTED] to LAD appears patent in the mid and distal segment into
the distal LAD but the pedicled proximal portion not visualized

The KLEVER to the distal RCA was not visualized
IMPRESSION: 1. Calcium score 3434 which is 99 th percentile for age/sex

2.  Severe native 3 vessel CAD see above

3. Patient will be brought back to be res-canned from the clavicles
down The AZCONA;Phil to the OM/Diagonal and the [REDACTED] to the LAD

Appear patent The KLEVER to the PDA is not visualized

4.  Normal ascending aortic root 3.5 cm

Discussed with CT tech, CT Navigator and Dr Polivalente

Blain Jumper

## 2022-10-04 ENCOUNTER — Telehealth: Payer: No Typology Code available for payment source | Admitting: Nurse Practitioner

## 2022-10-04 ENCOUNTER — Encounter: Payer: Self-pay | Admitting: Nurse Practitioner

## 2022-10-04 DIAGNOSIS — Z8719 Personal history of other diseases of the digestive system: Secondary | ICD-10-CM | POA: Diagnosis not present

## 2022-10-04 MED ORDER — CIPROFLOXACIN HCL 500 MG PO TABS: 500.0000 mg | ORAL_TABLET | Freq: Two times a day (BID) | ORAL | 0 refills | Status: AC

## 2022-10-04 MED ORDER — METRONIDAZOLE 500 MG PO TABS: 500.0000 mg | ORAL_TABLET | Freq: Three times a day (TID) | ORAL | 0 refills | Status: AC

## 2022-10-04 NOTE — Progress Notes (Signed)
Virtual Visit Consent   Talor Conwill, you are scheduled for a virtual visit with a [facility] provider today. Just as with appointments in the office, your consent must be obtained to participate. Your consent will be active for this visit and any virtual visit you may have with one of our providers in the next 365 days. If you have a MyChart account, a copy of this consent can be sent to you electronically.  As this is a virtual visit, video technology does not allow for your provider to perform a traditional examination. This may limit your provider's ability to fully assess your condition. If your provider identifies any concerns that need to be evaluated in person or the need to arrange testing (such as labs, EKG, etc.), we will make arrangements to do so. Although advances in technology are sophisticated, we cannot ensure that it will always work on either your end or our end. If the connection with a video visit is poor, the visit may have to be switched to a telephone visit. With either a video or telephone visit, we are not always able to ensure that we have a secure connection.  By engaging in this virtual visit, you consent to the provision of healthcare and authorize for your insurance to be billed (if applicable) for the services provided during this visit. Depending on your insurance coverage, you may receive a charge related to this service.  I need to obtain your verbal consent now. Are you willing to proceed with your visit today? Oscar [name] has provided verbal consent on 10/04/2022 for a virtual visit (video or telephone). Viviano Simas, FNP  Date: 10/04/2022 8:08 AM  Virtual Visit via Video Note   I, Viviano Simas, connected with  Oscar [name]  ([id number], 07/02/1960) on 10/04/22 at  8:15 AM EDT by a video-enabled telemedicine application and verified that I am speaking with the correct person using two identifiers.  Location: Patient: Virtual Visit Location Patient:  Home Provider: Virtual Visit Location Provider: Home Office   I discussed the limitations of evaluation and management by telemedicine and the availability of in person appointments. The patient expressed understanding and agreed to proceed.    History of Present Illness: Oscar [name] is a 62 y.o. who identifies as a male who was assigned male at birth, and is being seen today with complaints of diverticulitis  He has had this in the past  Has had constipation in associated with LLQ pain and nausea and bloating  These are symptoms similar to occurences in the past  Denies a fever  Denies any bleeding in stools though he has had this in the past  Most recent episode was 4-5 year ago   He has had clips placed in the past  Has a GI physician  Due for colonoscopy in 2025   He does not routinely take a laxative  Takes probiotics daily  Keeps a clean diet   [place] the [place] - [place] weekend   Problems:  Patient Active Problem List   Diagnosis Date Noted   Orthopnea 12/17/2018   History of ST elevation myocardial infarction (STEMI) 12/17/2018   Erythrocytosis 11/25/2018   Schatzki's ring of distal esophagus    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 10/26/2018   Acute ST elevation myocardial infarction (STEMI) of inferolateral wall (HCC) - Aborted 10/21/2018   Diabetes mellitus type 2 with complications (HCC) 10/21/2018   CAD, multiple vessel 10/21/2018   Pain in joint of  left elbow 04/30/2018   Pain of left calf 07/08/2017   Prolapsed internal hemorrhoids, grade 2 12/05/2016   Coronary artery disease involving native coronary artery with angina pectoris (HCC) 01/11/2016   Family history of premature CAD 01/11/2016   Ventral hernia 10/25/2014   Diverticular hemorrhage - recurrent 12/26/2012   Obesity (BMI 30-39.9) 04/17/2012   Incisional hernia, recurrent 02/24/2012   Rectus diastasis 02/24/2012   BACK PAIN, LUMBAR 11/16/2009    Hyperlipidemia associated with type 2 diabetes mellitus (HCC) 05/24/2008   Essential hypertension 05/24/2008   Asthma 05/24/2008   Sleep apnea 05/24/2008   NEPHROLITHIASIS, HX OF 05/24/2008    Allergies:  Allergies  Allergen Reactions   Acetaminophen Other (See Comments)    Bleeding issues   Ibuprofen Other (See Comments)    Bleeding issues   Advair Diskus [Fluticasone-Salmeterol] Cough   Latex Itching   Medications:  Current Outpatient Medications:    acidophilus (RISAQUAD) CAPS capsule, Take 1 capsule by mouth 2 (two) times daily., Disp: , Rfl:    amLODipine (NORVASC) 2.5 MG tablet, Take 2.5 mg by mouth at bedtime., Disp: , Rfl:    anastrozole (ARIMIDEX) 1 MG tablet, Take 5 mg by mouth 2 (two) times a week., Disp: , Rfl:    Ascorbic Acid (VITAMIN C CR) 1000 MG TBCR, Take 1,000 mg by mouth 2 (two) times daily., Disp: , Rfl:    aspirin EC 81 MG tablet, Take 1 tablet (81 mg total) by mouth daily., Disp: , Rfl:    cetirizine (ZYRTEC) 10 MG tablet, Take 10 mg by mouth daily as needed for allergies., Disp: , Rfl:    Cholecalciferol (DIALYVITE VITAMIN D 5000 PO), Take 5,000 mg by mouth daily., Disp: , Rfl:    Coenzyme Q10 (CO Q 10 PO), Take 1 tablet by mouth daily in the afternoon., Disp: , Rfl:    Cyanocobalamin (VITAMIN B 12 PO), Take 1 tablet by mouth daily., Disp: , Rfl:    hydrochlorothiazide (MICROZIDE) 12.5 MG capsule, Take 12.5 mg by mouth daily., Disp: , Rfl:    Magnesium 250 MG TABS, Take 1 tablet by mouth daily in the afternoon., Disp: , Rfl:    metFORMIN (GLUCOPHAGE) 1000 MG tablet, Take 1,000 mg by mouth 2 (two) times daily., Disp: , Rfl:    Nutritional Supplements (DHEA PO), Take 35 mg by mouth daily in the afternoon., Disp: , Rfl:    Omega-3 Fatty Acids (FISH OIL) 1000 MG CAPS, Take 1,000 mg by mouth daily in the afternoon., Disp: , Rfl:    OVER THE COUNTER MEDICATION, Take 1 tablet by mouth daily. Primal defense probiotic tablet, Disp: , Rfl:    REPATHA SURECLICK 140 MG/ML  SOAJ, ADMINISTER 1 ML UNDER THE SKIN EVERY 14 DAYS, Disp: 2 mL, Rfl: 11   rosuvastatin (CRESTOR) 5 MG tablet, Take 5 mg by mouth daily., Disp: , Rfl:    Semaglutide, 1 MG/DOSE, (OZEMPIC, 1 MG/DOSE,) 4 MG/3ML SOPN, Inject 1 mg into the skin once a week., Disp: 3 mL, Rfl: 5   Zinc 50 MG TABS, Take 1 tablet by mouth daily in the afternoon., Disp: , Rfl:   Observations/Objective: Patient is well-developed, well-nourished in no acute distress.  Resting comfortably  at home.  Head is normocephalic, atraumatic.  No labored breathing.  Speech is clear and coherent with logical content.  Patient is alert and oriented at baseline.    Assessment and Plan: 1. History of diverticulitis Discussed precautions of flouroquinolone antibiotics  Advised follow up with GI when returning  from vacation  Seek in person evaluation for new or worsening symptoms   - ciprofloxacin (CIPRO) 500 MG tablet; Take 1 tablet (500 mg total) by mouth every 12 (twelve) hours for 7 days.  Dispense: 14 tablet; Refill: 0 - metroNIDAZOLE (FLAGYL) 500 MG tablet; Take 1 tablet (500 mg total) by mouth 3 (three) times daily for 7 days.  Dispense: 21 tablet; Refill: 0     Follow Up Instructions: I discussed the assessment and treatment plan with the patient. The patient was provided an opportunity to ask questions and all were answered. The patient agreed with the plan and demonstrated an understanding of the instructions.  A copy of instructions were sent to the patient via MyChart unless otherwise noted below.    The patient was advised to call back or seek an in-person evaluation if the symptoms worsen or if the condition fails to improve as anticipated.  Time:  I spent 15 minutes with the patient via telehealth technology discussing the above problems/concerns.    Viviano Simas, FNP

## 2022-10-08 ENCOUNTER — Telehealth: Payer: Self-pay | Admitting: Internal Medicine

## 2022-10-08 NOTE — Telephone Encounter (Signed)
Left message for pt to call back  °

## 2022-10-08 NOTE — Telephone Encounter (Signed)
Pt stated that he recently had an one episode of Diverticulitis and was treated through [facility] health  virtual visit Dr.   Rock Nephew stated that he is on antibiotics. Pt questioned the recall for 2028. Pt was wondering if he needs to have one sooner.  Please advise

## 2022-10-08 NOTE — Telephone Encounter (Signed)
This type of occurrence-diverticulitis in somebody with known diverticulosis does not tend to change the timing of a planned repeat colonoscopy.  That is, as long as he gets better and does not have recurrent problems.

## 2022-10-08 NOTE — Telephone Encounter (Signed)
Pt was made aware of Dr. Gessner recommendations Pt verbalized understanding with all questions answered.   

## 2022-10-08 NOTE — Telephone Encounter (Signed)
PT called to schedule colonoscopy. Advised his recall is 05/2026 and he wants to know can he have it done now. Requesting call back.

## 2022-10-30 ENCOUNTER — Other Ambulatory Visit: Payer: Self-pay

## 2022-10-30 ENCOUNTER — Emergency Department (HOSPITAL_BASED_OUTPATIENT_CLINIC_OR_DEPARTMENT_OTHER): Payer: No Typology Code available for payment source

## 2022-10-30 ENCOUNTER — Other Ambulatory Visit (HOSPITAL_BASED_OUTPATIENT_CLINIC_OR_DEPARTMENT_OTHER): Payer: Self-pay

## 2022-10-30 ENCOUNTER — Encounter (HOSPITAL_BASED_OUTPATIENT_CLINIC_OR_DEPARTMENT_OTHER): Payer: Self-pay | Admitting: Emergency Medicine

## 2022-10-30 ENCOUNTER — Emergency Department (HOSPITAL_BASED_OUTPATIENT_CLINIC_OR_DEPARTMENT_OTHER)
Admission: EM | Admit: 2022-10-30 | Discharge: 2022-10-30 | Disposition: A | Discharge status: Home / Self Care | Payer: No Typology Code available for payment source | Attending: Emergency Medicine | Admitting: Emergency Medicine

## 2022-10-30 DIAGNOSIS — R109 Unspecified abdominal pain: Secondary | ICD-10-CM | POA: Diagnosis present

## 2022-10-30 DIAGNOSIS — Z91040 Latex allergy status: Secondary | ICD-10-CM | POA: Diagnosis not present

## 2022-10-30 DIAGNOSIS — I2510 Atherosclerotic heart disease of native coronary artery without angina pectoris: Secondary | ICD-10-CM | POA: Diagnosis not present

## 2022-10-30 DIAGNOSIS — Z79899 Other long term (current) drug therapy: Secondary | ICD-10-CM | POA: Insufficient documentation

## 2022-10-30 DIAGNOSIS — N200 Calculus of kidney: Secondary | ICD-10-CM | POA: Diagnosis not present

## 2022-10-30 DIAGNOSIS — Z7982 Long term (current) use of aspirin: Secondary | ICD-10-CM | POA: Diagnosis not present

## 2022-10-30 DIAGNOSIS — I509 Heart failure, unspecified: Secondary | ICD-10-CM | POA: Insufficient documentation

## 2022-10-30 DIAGNOSIS — I110 Hypertensive heart disease with heart failure: Secondary | ICD-10-CM | POA: Insufficient documentation

## 2022-10-30 LAB — URINALYSIS, ROUTINE W REFLEX MICROSCOPIC
Bilirubin Urine: NEGATIVE
Glucose, UA: NEGATIVE mg/dL
Hgb urine dipstick: NEGATIVE
Ketones, ur: NEGATIVE mg/dL
Leukocytes,Ua: NEGATIVE
Nitrite: NEGATIVE
Specific Gravity, Urine: 1.022 (ref 1.005–1.030)
pH: 5.5 (ref 5.0–8.0)

## 2022-10-30 LAB — BASIC METABOLIC PANEL
Anion gap: 9 (ref 5–15)
BUN: 23 mg/dL (ref 8–23)
CO2: 25 mmol/L (ref 22–32)
Calcium: 9.4 mg/dL (ref 8.9–10.3)
Chloride: 105 mmol/L (ref 98–111)
Creatinine, Ser: 1.46 mg/dL — ABNORMAL HIGH (ref 0.61–1.24)
GFR, Estimated: 54 mL/min — ABNORMAL LOW (ref >60)
Glucose, Bld: 121 mg/dL — ABNORMAL HIGH (ref 70–99)
Potassium: 3.9 mmol/L (ref 3.5–5.1)
Sodium: 139 mmol/L (ref 135–145)

## 2022-10-30 LAB — CBC
HCT: 50.7 % (ref 39.0–52.0)
Hemoglobin: 17.3 g/dL — ABNORMAL HIGH (ref 13.0–17.0)
MCH: 31.1 pg (ref 26.0–34.0)
MCHC: 34.1 g/dL (ref 30.0–36.0)
MCV: 91 fL (ref 80.0–100.0)
Platelets: 203 10*3/uL (ref 150–400)
RBC: 5.57 MIL/uL (ref 4.22–5.81)
RDW: 14 % (ref 11.5–15.5)
WBC: 9.3 10*3/uL (ref 4.0–10.5)
nRBC: 0 % (ref 0.0–0.2)

## 2022-10-30 LAB — LIPASE, BLOOD: Lipase: 31 U/L (ref 11–51)

## 2022-10-30 LAB — HEPATIC FUNCTION PANEL
ALT: 16 U/L (ref 0–44)
AST: 21 U/L (ref 15–41)
Albumin: 4.2 g/dL (ref 3.5–5.0)
Alkaline Phosphatase: 42 U/L (ref 38–126)
Bilirubin, Direct: 0.1 mg/dL (ref 0.0–0.2)
Indirect Bilirubin: 0.4 mg/dL (ref 0.3–0.9)
Total Bilirubin: 0.5 mg/dL (ref 0.3–1.2)
Total Protein: 7.3 g/dL (ref 6.5–8.1)

## 2022-10-30 MED ORDER — TAMSULOSIN HCL 0.4 MG PO CAPS: 0.4000 mg | ORAL_CAPSULE | Freq: Every day | ORAL | 0 refills | Status: AC

## 2022-10-30 MED ORDER — OXYCODONE HCL 5 MG PO TABS: 5.0000 mg | ORAL_TABLET | Freq: Four times a day (QID) | ORAL | 0 refills | Status: DC | PRN

## 2022-10-30 MED ORDER — MORPHINE SULFATE (PF) 4 MG/ML IV SOLN
4.0000 mg | INTRAVENOUS | Status: DC | PRN
  Administered 2022-10-30: 4 mg via INTRAVENOUS
  Filled 2022-10-30: qty 1

## 2022-10-30 MED ORDER — TAMSULOSIN HCL 0.4 MG PO CAPS
0.4000 mg | ORAL_CAPSULE | Freq: Once | ORAL | Status: AC
  Administered 2022-10-30: 0.4 mg via ORAL
  Filled 2022-10-30: qty 1

## 2022-10-30 MED ORDER — LACTATED RINGERS IV BOLUS
1000.0000 mL | Freq: Once | INTRAVENOUS | Status: AC
  Administered 2022-10-30: 1000 mL via INTRAVENOUS

## 2022-10-30 MED ORDER — HYDROMORPHONE HCL 1 MG/ML IJ SOLN
0.5000 mg | Freq: Once | INTRAMUSCULAR | Status: DC
  Filled 2022-10-30: qty 1

## 2022-10-30 NOTE — ED Provider Notes (Signed)
[place] EMERGENCY DEPARTMENT AT [facility] Provider Note   CSN: [id number] Arrival date & time: 10/30/22  0801     History  Chief Complaint  Patient presents with   Flank Pain    Oscar [name] is a 62 y.o. male.   Flank Pain  62 year old male history of CHF, CAD status post CABG, hypertension, hyperlipidemia presenting for flank pain.  He states for the last few weeks he felt his had urinary urgency.  Last few days has had right flank pain occasionally radiates to his groin which is worsened overnight.  He feels like he is having trouble voiding.  No frank dysuria, hematuria.  No penile discharge.  Is not had any fevers or chills.  No swelling or pain to his penis or testicles.  He has had a kidney stone before and is concerned he has another kidney stone.     Home Medications Prior to Admission medications   Medication Sig Start Date End Date Taking? Authorizing Provider  amoxicillin-clavulanate (AUGMENTIN) 875-125 MG tablet Take 1 tablet by mouth 2 (two) times daily. 10/29/22 11/08/22 Yes [provider]  oxyCODONE (ROXICODONE) 5 MG immediate release tablet Take 1 tablet (5 mg total) by mouth every 6 (six) hours as needed for severe pain. 10/30/22  Yes Laurence Spates, MD  tamsulosin (FLOMAX) 0.4 MG CAPS capsule Take 1 capsule (0.4 mg total) by mouth daily after supper for 5 days. 10/30/22 11/04/22 Yes Laurence Spates, MD  acidophilus (RISAQUAD) CAPS capsule Take 1 capsule by mouth 2 (two) times daily.    [provider]  amLODipine (NORVASC) 2.5 MG tablet Take 2.5 mg by mouth at bedtime.    [provider]  anastrozole (ARIMIDEX) 1 MG tablet Take 5 mg by mouth 2 (two) times a week.    [provider]  Ascorbic Acid (VITAMIN C CR) 1000 MG TBCR Take 1,000 mg by mouth 2 (two) times daily.    [provider]  aspirin EC 81 MG tablet Take 1 tablet (81 mg total) by mouth daily. 10/27/18   Barrett, Erin R, PA-C  cetirizine (ZYRTEC)  10 MG tablet Take 10 mg by mouth daily as needed for allergies.    [provider]  Cholecalciferol (DIALYVITE VITAMIN D 5000 PO) Take 5,000 mg by mouth daily.    [provider]  Coenzyme Q10 (CO Q 10 PO) Take 1 tablet by mouth daily in the afternoon.    [provider]  Cyanocobalamin (VITAMIN B 12 PO) Take 1 tablet by mouth daily.    [provider]  hydrochlorothiazide (MICROZIDE) 12.5 MG capsule Take 12.5 mg by mouth daily.    [provider]  Magnesium 250 MG TABS Take 1 tablet by mouth daily in the afternoon.    [provider]  metFORMIN (GLUCOPHAGE) 1000 MG tablet Take 1,000 mg by mouth 2 (two) times daily.    [provider]  MOUNJARO 5 MG/0.5ML Pen Inject 5 mg into the skin once a week.    [provider]  Nutritional Supplements (DHEA PO) Take 35 mg by mouth daily in the afternoon.    [provider]  Omega-3 Fatty Acids (FISH OIL) 1000 MG CAPS Take 1,000 mg by mouth daily in the afternoon.    [provider]  OVER THE COUNTER MEDICATION Take 1 tablet by mouth daily. Primal defense probiotic tablet    [provider]  REPATHA SURECLICK 140 MG/ML SOAJ ADMINISTER 1 ML UNDER THE SKIN EVERY 14 DAYS  03/08/22   Laurey Morale, MD  rosuvastatin (CRESTOR) 5 MG tablet Take 5 mg by mouth daily.    [provider]  Semaglutide, 1 MG/DOSE, (OZEMPIC, 1 MG/DOSE,) 4 MG/3ML SOPN Inject 1 mg into the skin once a week. 03/14/22   Laurey Morale, MD  Zinc 50 MG TABS Take 1 tablet by mouth daily in the afternoon.    [provider]      Allergies    Acetaminophen, Ibuprofen, Advair diskus [fluticasone-salmeterol], and Latex    Review of Systems   Review of Systems  Genitourinary:  Positive for flank pain.  Review of systems completed and notable as per HPI.  ROS otherwise negative.   Physical Exam Updated Vital Signs BP (!) 168/116 (BP Location: Right Arm)   Pulse 85   Temp  98.1 F (36.7 C) (Oral)   Resp 18   Ht 6\' 2"  (1.88 m)   Wt 111.1 kg   SpO2 96%   BMI 31.46 kg/m  Physical Exam Vitals and nursing note reviewed.  Constitutional:      General: He is not in acute distress.    Appearance: He is well-developed.  HENT:     Head: Normocephalic and atraumatic.  Eyes:     Conjunctiva/sclera: Conjunctivae normal.  Cardiovascular:     Rate and Rhythm: Normal rate and regular rhythm.     Heart sounds: No murmur heard. Pulmonary:     Effort: Pulmonary effort is normal. No respiratory distress.     Breath sounds: Normal breath sounds.  Abdominal:     Palpations: Abdomen is soft.     Tenderness: There is no abdominal tenderness. There is no right CVA tenderness, left CVA tenderness, guarding or rebound.  Musculoskeletal:        General: No swelling.     Cervical back: Neck supple.     Right lower leg: No edema.     Left lower leg: No edema.  Skin:    General: Skin is warm and dry.     Capillary Refill: Capillary refill takes less than 2 seconds.  Neurological:     Mental Status: He is alert.  Psychiatric:        Mood and Affect: Mood normal.     ED Results / Procedures / Treatments   Labs (all labs ordered are listed, but only abnormal results are displayed) Labs Reviewed  URINALYSIS, ROUTINE W REFLEX MICROSCOPIC - Abnormal; Notable for the following components:      Result Value   Protein, ur TRACE (*)    All other components within normal limits  BASIC METABOLIC PANEL - Abnormal; Notable for the following components:   Glucose, Bld 121 (*)    Creatinine, Ser 1.46 (*)    GFR, Estimated 54 (*)    All other components within normal limits  CBC - Abnormal; Notable for the following components:   Hemoglobin 17.3 (*)    All other components within normal limits  HEPATIC FUNCTION PANEL  LIPASE, BLOOD    EKG None  Radiology CT ABDOMEN PELVIS WO CONTRAST  Result Date: 10/30/2022 CLINICAL DATA:  R flank pain, c/f kidney stone EXAM: CT  ABDOMEN AND PELVIS WITHOUT CONTRAST TECHNIQUE: Multidetector CT imaging of the abdomen and pelvis was performed following the standard protocol without IV contrast. RADIATION DOSE REDUCTION: This exam was performed according to the departmental dose-optimization program which includes automated exposure control, adjustment of the mA and/or kV according to patient size and/or use of iterative reconstruction technique. COMPARISON:  CT scan abdomen and pelvis from 11/20/2015. FINDINGS: Lower chest: The lung bases are clear. No pleural effusion. The heart is normal in size. No pericardial effusion. Hepatobiliary: The liver is normal in size. Non-cirrhotic configuration. No suspicious mass. Stable subcentimeter hypoattenuating focus in the right hepatic dome, too small to adequately characterize but favored benign considering stability. No intrahepatic or extrahepatic bile duct dilation. Small volume layering calcified gallstones noted in the neck region. No imaging evidence of acute cholecystitis. Normal gallbladder wall thickness. No pericholecystic inflammatory changes. Pancreas: Unremarkable. No pancreatic ductal dilatation or surrounding inflammatory changes. Spleen: Within normal limits. No focal lesion. Adrenals/Urinary Tract: Adrenal glands are unremarkable. No suspicious mass seen in the bilateral kidneys on this unenhanced images. There is a 3.6 x 6.4 mm right ureterovesical junction calculus with resultant moderate proximal hydronephrosis and hydroureter. There is also mild moderate right perinephric and periureteric fat stranding. No other nephro lithiasis on the right. Unremarkable left kidney. No hydroureteronephrosis or nephroureterolithiasis. Urinary bladder is under distended, precluding optimal assessment. However, no large mass identified. No perivesical fat stranding. There is a right UVJ calculus as described above. Stomach/Bowel: There is a small sliding hiatal hernia. No disproportionate dilation  of the small or large bowel loops. No evidence of abnormal bowel wall thickening or inflammatory changes. The appendix is surgically absent. There are multiple diverticula throughout the colon, without imaging signs of diverticulitis. Vascular/Lymphatic: No ascites or pneumoperitoneum. No abdominal or pelvic lymphadenopathy, by size criteria. No aneurysmal dilation of the major abdominal arteries. There are mild peripheral atherosclerotic vascular calcifications of the aorta and its major branches. Reproductive: Enlarged prostate. Symmetric seminal vesicles. Other: There are bilateral small fat containing inguinal hernias. Periumbilical surgical scar and calcifications noted, likely from prior surgery. Musculoskeletal: No suspicious osseous lesions. There are mild multilevel degenerative changes in the visualized spine. IMPRESSION: 1. Right UVJ 3.6 x 6.4 mm calculus with resultant moderate proximal hydronephrosis and hydroureter. 2. Cholelithiasis without imaging evidence of acute cholecystitis. 3. Colonic diverticulosis without imaging signs of diverticulitis. 4. Multiple other nonacute observations, as described above. Aortic Atherosclerosis (ICD10-I70.0). Electronically Signed   By: Jules Schick M.D.   On: 10/30/2022 08:54    Procedures Procedures    Medications Ordered in ED Medications  morphine (PF) 4 MG/ML injection 4 mg (4 mg Intravenous Given 10/30/22 0939)  HYDROmorphone (DILAUDID) injection 0.5 mg (0 mg Intravenous Hold 10/30/22 1011)  lactated ringers bolus 1,000 mL (0 mLs Intravenous Stopped 10/30/22 1022)  tamsulosin (FLOMAX) capsule 0.4 mg (0.4 mg Oral Given 10/30/22 1011)    ED Course/ Medical Decision Making/ A&P                                 Medical Decision Making Amount and/or Complexity of Data Reviewed Labs: ordered. Radiology: ordered.  Risk Prescription drug management.   Medical Decision Making:   Amrit Nevells is a 62 y.o. male who presented to the ED today with  right flank pain.  Vital signs notable for hypertension.  On exam he is somewhat uncomfortable.  His abdominal exam is benign, pain seems more in his right flank.  Concern for nephrolithiasis, pyelonephritis, UTI.  Will plan to obtain CT scan for evaluation.  Lower concern for cholecystitis, appendicitis, bowel obstruction, mesenteric ischemia based on exam and history.  Denies any testicular pain, seems less consistent with torsion.   Patient placed on continuous vitals and telemetry monitoring while in ED which was reviewed  periodically.  Reviewed and confirmed nursing documentation for past medical history, family history, social history.  Initial Study Results:   Laboratory  All laboratory results reviewed.  Labs notable for creatinine 1.46 from 1.26, minimal change.  Urinalysis without signs of infection.  CBC unremarkable.  Radiology:  All images reviewed independently.  Right UVJ stone with hydronephrosis.  Agree with radiology report at this time.    Reassessment and Plan:   Patient initially had worsening pain in the ED.  However shortly for getting additional pain medication he reports pain nearly resolved after he urinated.  I wonder if he may have passed a stone.  He has minimal pain now and is feeling better than when he got here.  His presentation shows right-sided UVJ stone, 3 x 6 mm.  His renal function is essentially at baseline, and I do not see any signs of infection.  His pain is nearly resolved, given this I do not think there is indication for admission at this time.  Feel he can safely follow-up with urology.  Given to phone numbers to call for urological follow-up and told him to call today for close follow-up.  I given prescription for oxycodone and Flomax, given strict return precautions for any new or worsening symptoms.  He was comfortable this plan.  Discharged in stable condition.   Patient's presentation is most consistent with acute complicated illness / injury  requiring diagnostic workup.           Final Clinical Impression(s) / ED Diagnoses Final diagnoses:  Kidney stone    Rx / DC Orders ED Discharge Orders          Ordered    oxyCODONE (ROXICODONE) 5 MG immediate release tablet  Every 6 hours PRN        10/30/22 1114    tamsulosin (FLOMAX) 0.4 MG CAPS capsule  Daily after supper        10/30/22 1114              Laurence Spates, MD 10/30/22 1122

## 2022-10-30 NOTE — Discharge Instructions (Addendum)
Your CT scan shows a kidney stone.  This may pass on its own, you are being discharged with pain medication, do not take this while driving or doing anything dangerous as it may make you drowsy.  You are also being started on Flomax to take.  You should call the number above to schedule follow-up with urology today.  You should also follow-up with your primary care provider.  If you develop severe pain, difficulty urinating, fever you should return to the ED.

## 2022-10-30 NOTE — ED Triage Notes (Signed)
Pt arrives to ED with c/o right sided discomfort for several days with acute pain starting last night.

## 2022-10-30 NOTE — ED Notes (Signed)
Patient verbalizes understanding of discharge instructions. Opportunity for questioning and answers were provided. Patient discharged from ED.  °

## 2022-10-30 NOTE — ED Notes (Signed)
Bladder Scan volume post void 0mL

## 2022-11-05 ENCOUNTER — Telehealth: Payer: Self-pay | Admitting: Cardiology

## 2022-11-05 NOTE — Telephone Encounter (Signed)
Pt c/o medication issue:  1. Name of Medication: REPATHA SURECLICK 140 MG/ML SOAJ   2. How are you currently taking this medication (dosage and times per day)? ADMINISTER 1 ML UNDER THE SKIN EVERY 14 DAYS   3. Are you having a reaction (difficulty breathing--STAT)? no  4. What is your medication issue? Clint from [facility] was calling because PA is required the medication. Please advise.

## 2022-11-06 ENCOUNTER — Other Ambulatory Visit (HOSPITAL_COMMUNITY): Payer: Self-pay

## 2022-11-06 ENCOUNTER — Telehealth: Payer: Self-pay | Admitting: Pharmacy Technician

## 2022-11-06 NOTE — Telephone Encounter (Signed)
PA request has been Submitted. New Encounter created for follow up. For additional info see Pharmacy Prior Auth telephone encounter from 11/06/2022.

## 2022-11-06 NOTE — Telephone Encounter (Signed)
Pharmacy Patient Advocate Encounter   Received notification from Pt Calls Messages that prior authorization for Repatha SureClick 140MG /ML auto-injectors is required/requested.   Insurance verification completed.   The patient is insured through [employer] .   Per test claim: PA required; PA submitted to [employer] via CoverMyMeds Key/confirmation #/EOC [id number] Status is pending

## 2022-11-07 NOTE — Telephone Encounter (Signed)
Caller is following up on patient's prior authorization.  Ref# [id number]

## 2022-11-07 NOTE — Telephone Encounter (Signed)
PA is pending.

## 2022-11-12 ENCOUNTER — Encounter: Payer: Self-pay | Admitting: Pharmacist

## 2022-11-12 ENCOUNTER — Other Ambulatory Visit (HOSPITAL_COMMUNITY): Payer: Self-pay

## 2022-11-12 ENCOUNTER — Ambulatory Visit (HOSPITAL_COMMUNITY)
Admission: RE | Admit: 2022-11-12 | Discharge: 2022-11-12 | Disposition: A | Discharge status: Home / Self Care | Payer: No Typology Code available for payment source | Source: Ambulatory Visit | Attending: Cardiology | Admitting: Cardiology

## 2022-11-12 ENCOUNTER — Telehealth (HOSPITAL_COMMUNITY): Payer: Self-pay | Admitting: Cardiology

## 2022-11-12 DIAGNOSIS — M79605 Pain in left leg: Secondary | ICD-10-CM

## 2022-11-12 MED ORDER — REPATHA SURECLICK 140 MG/ML ~~LOC~~ SOAJ: 1.0000 mL | SUBCUTANEOUS | 0 refills | Status: DC

## 2022-11-12 NOTE — Telephone Encounter (Signed)
Patient picked up sample Lot [id number] Exp 06/29/24

## 2022-11-12 NOTE — Telephone Encounter (Signed)
Per Dr Shirlee Latch Pt will need U/S for left leg R/O DVT -pain in calf and swelling post flight   Order placed  -message to scheduling for appt

## 2022-11-12 NOTE — Telephone Encounter (Signed)
Spoke with Dorathy Daft, PA still pending. Pt is overdue for Repatha, due for injection yesterday. He will stop by [facility] office today to pick up a sample while his PA request is pending. He is aware request was sent in a full week ago and that his plan is taking an unusually long time to respond.

## 2022-11-18 NOTE — Telephone Encounter (Signed)
Pharmacy Patient Advocate Encounter  Received notification from [employer] that Prior Authorization for REPATHA has been DENIED. Please advise how you'd like to proceed. Full denial letter will be uploaded to the media tab. See denial reason below.  *LDL LABS WITHIN THE LAST 6 MONTHS REQUIRED

## 2022-11-20 ENCOUNTER — Other Ambulatory Visit (HOSPITAL_COMMUNITY): Payer: Self-pay

## 2022-11-20 MED ORDER — REPATHA SURECLICK 140 MG/ML ~~LOC~~ SOAJ: SUBCUTANEOUS | 1 refills | Status: DC

## 2022-11-25 ENCOUNTER — Telehealth: Payer: Self-pay | Admitting: Pharmacist

## 2022-11-25 ENCOUNTER — Telehealth: Payer: Self-pay

## 2022-11-25 ENCOUNTER — Other Ambulatory Visit (HOSPITAL_COMMUNITY): Payer: Self-pay

## 2022-11-25 DIAGNOSIS — E785 Hyperlipidemia, unspecified: Secondary | ICD-10-CM

## 2022-11-25 DIAGNOSIS — I2510 Atherosclerotic heart disease of native coronary artery without angina pectoris: Secondary | ICD-10-CM

## 2022-11-25 DIAGNOSIS — E1169 Type 2 diabetes mellitus with other specified complication: Secondary | ICD-10-CM

## 2022-11-25 NOTE — Telephone Encounter (Signed)
Pharmacy Patient Advocate Encounter   Received notification from Physician's Office that prior authorization for REPATHA is required/requested.   Insurance verification completed.   The patient is insured through [employer] .   Per test claim: PA required; PA submitted to [employer] via CMM FAX Key/confirmation #/EOC Key: [id number])     Status is pending

## 2022-11-25 NOTE — Telephone Encounter (Addendum)
Pt returned call, states he had his lipids checked with his wellness doctor on 10/24/22. He has emailed them over, noted below:  TC 78, HDL 41, nonHDL 37, LDL 10, TG 166.  Leaving sample up front for pt as well as he is coming over now to clinic to pick it up.

## 2022-11-25 NOTE — Telephone Encounter (Signed)
Received message from Dr Shirlee Latch that pt is still having issues getting his Repatha. Reviewed recent notes, his PA was denied because he needs an updated lipid panel. He was sent a MyChart message about this a week ago but did not respond. He is due for another injection today.  Called pt to discuss, no answer, left detailed message. Can give another sample as well.

## 2022-11-26 ENCOUNTER — Other Ambulatory Visit (HOSPITAL_COMMUNITY): Payer: Self-pay

## 2022-11-26 NOTE — Telephone Encounter (Signed)
Pharmacy Patient Advocate Encounter  Received notification from [employer] that Prior Authorization for Repatha has been APPROVED from 11/26/22 to 11/26/23. Ran test claim, Copay is $0.00. This test claim was processed through [facility]- copay amounts may vary at other pharmacies due to pharmacy/plan contracts, or as the patient moves through the different stages of their insurance plan.   PA #/Case ID/Reference #: [id number]

## 2022-11-27 ENCOUNTER — Other Ambulatory Visit (HOSPITAL_BASED_OUTPATIENT_CLINIC_OR_DEPARTMENT_OTHER): Payer: Self-pay

## 2022-11-27 MED ORDER — REPATHA SURECLICK 140 MG/ML ~~LOC~~ SOAJ
1.0000 mL | SUBCUTANEOUS | 1 refills | Status: DC
Start: 2022-11-27 — End: 2023-01-20
  Filled 2022-11-27: qty 6, 84d supply, fill #0

## 2022-11-27 NOTE — Addendum Note (Signed)
Addended by: Cheree Ditto on: 11/27/2022 12:42 PM   Modules accepted: Orders

## 2022-11-29 NOTE — Telephone Encounter (Signed)
Request has been approved, cannot see end date though. Pt has been notified.

## 2022-12-13 ENCOUNTER — Encounter (HOSPITAL_COMMUNITY): Payer: Self-pay | Admitting: Cardiology

## 2022-12-13 ENCOUNTER — Ambulatory Visit (HOSPITAL_COMMUNITY)
Admission: RE | Admit: 2022-12-13 | Discharge: 2022-12-13 | Disposition: A | Discharge status: Home / Self Care | Payer: No Typology Code available for payment source | Source: Ambulatory Visit | Attending: Cardiology | Admitting: Cardiology

## 2022-12-13 VITALS — BP 148/96 | HR 95 | Wt 263.4 lb

## 2022-12-13 DIAGNOSIS — Z951 Presence of aortocoronary bypass graft: Secondary | ICD-10-CM | POA: Diagnosis not present

## 2022-12-13 DIAGNOSIS — G4733 Obstructive sleep apnea (adult) (pediatric): Secondary | ICD-10-CM | POA: Diagnosis not present

## 2022-12-13 DIAGNOSIS — I2510 Atherosclerotic heart disease of native coronary artery without angina pectoris: Secondary | ICD-10-CM | POA: Insufficient documentation

## 2022-12-13 DIAGNOSIS — E119 Type 2 diabetes mellitus without complications: Secondary | ICD-10-CM | POA: Insufficient documentation

## 2022-12-13 DIAGNOSIS — E785 Hyperlipidemia, unspecified: Secondary | ICD-10-CM | POA: Diagnosis not present

## 2022-12-13 DIAGNOSIS — Z7985 Long-term (current) use of injectable non-insulin antidiabetic drugs: Secondary | ICD-10-CM | POA: Diagnosis not present

## 2022-12-13 DIAGNOSIS — Z7984 Long term (current) use of oral hypoglycemic drugs: Secondary | ICD-10-CM | POA: Diagnosis not present

## 2022-12-13 DIAGNOSIS — Z8249 Family history of ischemic heart disease and other diseases of the circulatory system: Secondary | ICD-10-CM | POA: Diagnosis not present

## 2022-12-13 DIAGNOSIS — I252 Old myocardial infarction: Secondary | ICD-10-CM | POA: Diagnosis not present

## 2022-12-13 DIAGNOSIS — E1169 Type 2 diabetes mellitus with other specified complication: Secondary | ICD-10-CM | POA: Diagnosis not present

## 2022-12-13 DIAGNOSIS — Z7982 Long term (current) use of aspirin: Secondary | ICD-10-CM | POA: Diagnosis not present

## 2022-12-13 DIAGNOSIS — Z79899 Other long term (current) drug therapy: Secondary | ICD-10-CM | POA: Insufficient documentation

## 2022-12-13 DIAGNOSIS — I10 Essential (primary) hypertension: Secondary | ICD-10-CM | POA: Insufficient documentation

## 2022-12-13 MED ORDER — AMLODIPINE BESYLATE 5 MG PO TABS: 5.0000 mg | ORAL_TABLET | Freq: Every day | ORAL | 3 refills | Status: DC

## 2022-12-13 NOTE — Patient Instructions (Signed)
Medication Changes:  Increase amlodipine to 5mg  once daily   Lab Work:  Labs done today, your results will be available in MyChart, we will contact you for abnormal readings.  Follow-Up in: 6 months PLEASE CALL OUR OFFICE AROUND JANUARY TO GET SCHEDULED FOR YOUR APPOINTMENT. PHONE NUMBER IS [phone] OPTION 2    At the [facility], you and your health needs are our priority. We have a designated team specialized in the treatment of Heart Failure. This Care Team includes your primary Heart Failure Specialized Cardiologist (physician), Advanced Practice Providers (APPs- Physician Assistants and Nurse Practitioners), and Pharmacist who all work together to provide you with the care you need, when you need it.   You may see any of the following providers on your designated Care Team at your next follow up:  Dr. Arvilla Meres Dr. [name] Dr. Marcos Eke, NP Robbie Lis, [place] [facility] [place], [place] Brynda Peon, NP Karle Plumber, PharmD   Please be sure to bring in all your medications bottles to every appointment.   Need to Contact us:  If you have any questions or concerns before your next appointment please send [place] a message through [place] or call our office at [phone].    TO LEAVE A MESSAGE FOR THE NURSE SELECT OPTION 2, PLEASE LEAVE A MESSAGE INCLUDING: YOUR NAME DATE OF BIRTH CALL BACK NUMBER REASON FOR CALL**this is important as we prioritize the call backs  YOU WILL RECEIVE A CALL BACK THE SAME DAY AS LONG AS YOU CALL BEFORE 4:00 PM

## 2022-12-15 NOTE — Progress Notes (Signed)
PCP: Dr. Cyndia Bent Cardiology: Dr. Shirlee Latch  62 y.o. with history of CAD s/p CABG was self-referred for evaluation of CAD.  Patient has a very strong family history of premature CAD.  He was in his usual state of health until 7/20 when he presented to the ER with chest pain and was found to have NSTEMI.  Cath showed 3 vessel disease, patient was taken for CABG x 4 by Dr. Vickey Sages.  Post-op, he had brief atrial fibrillation that resolved quickly.  No further atrial fibrillation has been seen.  Echo in 7/20 showed EF 60-65%.  Cardiac MRI in 6/21 at [facility] was normal.  In 5/22, patient had LHC that showed atretic RIMA-RCA with moderate RCA disease.  However, FFR and RFR of the RCA were negative, so he was medically managed.   Patient returns for followup of CAD.  Using his CPAP every night.  Weight down 4 lbs.  SBP 130s-140s at home.  No exertional dyspnea or chest pain.  Going to gym to work out at least 4 days/week.  Stays very active.  Main complaint is hip pain.    Labs (3/22): creatinine 1.3, LDL 16, HDL 28.  Labs (12/22): LDL 68, TGs 149, K 4, creatinine 1.2, hgbA1c 5.8 Labs (3/23): LDL 26, TGs 71, HDL 33 Labs (10/23): LDL 16, hs-CRP 2.6 Labs (7/24): LDL 10, TGs 166, HDL 41  PMH: 1. CAD: NSTEMI in 7/20 with cath showing 3 vessel disease.  - CABG (7/20) with LIMA-LAD, RIMA-PDA, SVG-diagonal, radial-OM.  - Echo (7/20): EF 60-65%, normal RV.  - Stress echo (6/21): EF >55%, no ischemia.  - LHC (5/22): RIMA-RCA atretic with 65% pRCA and 65% PDA stenosis, 99% mLCx with patent radial-OM3, 70% D1 with patent SVG-D1, LIMA-LAD patent with 90% pLAD.   Negative FFR and RFR of RCA, medical management.  2. Atrial fibrillation: Paroxysmal, only noted briefly post-op CABG.  3. Hyperlipidemia 4. HTN 5. PUD: GI bleeding post-MI.  6. S/p appendectomy 7. Multiple abdominal ventral hernia surgeries.  8. Type 2 diabetes 9. OSA: Uses CPAP  FH: Premature CAD.  Mother with MI in her 31s, brother with Marfans, another  brother with CABG, father died with cardiac arrest.   SH: Nonsmoker.  Married with children, lives in [place].  Owns [employer].   ROS: All systems reviewed and negative except as per HPI.   Current Outpatient Medications  Medication Sig Dispense Refill   anastrozole (ARIMIDEX) 1 MG tablet Take 5 mg by mouth 2 (two) times a week.     Ascorbic Acid (VITAMIN C CR) 1000 MG TBCR Take 1,000 mg by mouth 2 (two) times daily.     aspirin EC 81 MG tablet Take 1 tablet (81 mg total) by mouth daily.     cetirizine (ZYRTEC) 10 MG tablet Take 10 mg by mouth daily as needed for allergies.     Cholecalciferol (DIALYVITE VITAMIN D 5000 PO) Take 5,000 mg by mouth daily.     Coenzyme Q10 (CO Q 10 PO) Take 1 tablet by mouth daily in the afternoon.     Cyanocobalamin (VITAMIN B 12 PO) Take 1 tablet by mouth daily.     Evolocumab (REPATHA SURECLICK) 140 MG/ML SOAJ Inject 140 mg into the skin every 14 (fourteen) days. 6 mL 1   hydrochlorothiazide (MICROZIDE) 12.5 MG capsule Take 12.5 mg by mouth daily.     Magnesium 250 MG TABS Take 1 tablet by mouth daily in the afternoon.     metFORMIN (GLUCOPHAGE) 1000 MG tablet Take  1,000 mg by mouth 2 (two) times daily.     MOUNJARO 5 MG/0.5ML Pen Inject 5 mg into the skin once a week.     OVER THE COUNTER MEDICATION Take 1 tablet by mouth daily. Primal defense probiotic tablet     rosuvastatin (CRESTOR) 5 MG tablet Take 5 mg by mouth daily.     Zinc 50 MG TABS Take 1 tablet by mouth daily in the afternoon.     amLODipine (NORVASC) 5 MG tablet Take 1 tablet (5 mg total) by mouth at bedtime. 90 tablet 3   No current facility-administered medications for this encounter.   BP (!) 148/96   Pulse 95   Wt 119.5 kg (263 lb 6.4 oz)   SpO2 92%   BMI 33.82 kg/m  General: NAD Neck: No JVD, no thyromegaly or thyroid nodule.  Lungs: Clear to auscultation bilaterally with normal respiratory effort. CV: Nondisplaced PMI.  Heart regular S1/S2, no S3/S4, no  murmur.  No peripheral edema.  No carotid bruit.  Normal pedal pulses.  Abdomen: Soft, nontender, no hepatosplenomegaly, no distention.  Skin: Intact without lesions or rashes.  Neurologic: Alert and oriented x 3.  Psych: Normal affect. Extremities: No clubbing or cyanosis.  HEENT: Normal.   Assessment/Plan: 1.  CAD: NSTEMI in 7/20 with CABG x 4.  He has a very strong family history of CAD.  He is a nonsmoker.  ETT-echo in 6/21 was normal.  LHC in 5/22 showed atretic RIMA-RCA and moderate disease in RCA that was not hemodynamically significant by FFR or RFR.  He was medically managed.  If RCA disease were to worsen in the future, it is possible that RIMA flow may improve to compensate.  No exertional symptoms.  - Continue ASA 81 daily.  - Continue management with Crestor 5 and Repatha.  - He is on GLP-1 agonist tirzepatide.   2. HTN: BP has been elevated recently, reports increased stress.  - Continue hydrochlorothiazide 12.5 mg daily.  - Increase amlodipine to 5 mg daily.  3. Hyperlipidemia: LDL has been very low.  Hs-CRP was within the normal range.   - Continue current Crestor and Repatha.  - Check Lp(a), especially with FH of CAD.  4. Type 2 diabetes: He is on metformin and tirzepatide.   Followup in 6 months.   [name] 12/15/2022

## 2023-01-20 ENCOUNTER — Other Ambulatory Visit (HOSPITAL_COMMUNITY): Payer: Self-pay | Admitting: Cardiology

## 2023-01-20 DIAGNOSIS — I2510 Atherosclerotic heart disease of native coronary artery without angina pectoris: Secondary | ICD-10-CM

## 2023-01-20 DIAGNOSIS — E1169 Type 2 diabetes mellitus with other specified complication: Secondary | ICD-10-CM

## 2023-03-09 ENCOUNTER — Other Ambulatory Visit (HOSPITAL_COMMUNITY): Payer: Self-pay | Admitting: Cardiology

## 2023-03-09 DIAGNOSIS — I2510 Atherosclerotic heart disease of native coronary artery without angina pectoris: Secondary | ICD-10-CM

## 2023-03-09 DIAGNOSIS — E1169 Type 2 diabetes mellitus with other specified complication: Secondary | ICD-10-CM

## 2023-04-04 NOTE — Patient Instructions (Signed)
 DUE TO COVID-19 ONLY TWO VISITORS  (aged 63 and older)  ARE ALLOWED TO COME WITH YOU AND STAY IN THE WAITING ROOM ONLY DURING PRE OP AND PROCEDURE.   **NO VISITORS ARE ALLOWED IN THE SHORT STAY AREA OR RECOVERY ROOM!!**  IF YOU WILL BE ADMITTED INTO THE HOSPITAL YOU ARE ALLOWED ONLY FOUR SUPPORT PEOPLE DURING VISITATION HOURS ONLY (7 AM -8PM)   The support person(s) must pass our screening, gel in and out, and wear a mask at all times, including in the patient's room. Patients must also wear a mask when staff or their support person are in the room. Visitors GUEST BADGE MUST BE WORN VISIBLY  One adult visitor may remain with you overnight and MUST be in the room by 8 P.M.     Your procedure is scheduled on: 04/18/23   Report to [facility] Main Entrance    Report to admitting at : 10:30 AM   Call this number if you have problems the morning of surgery [phone]   Do not eat food :After Midnight.   After Midnight you may have the following liquids until : 10:00 AM DAY OF SURGERY  Water Black Coffee (sugar ok, NO MILK/CREAM OR CREAMERS)  Tea (sugar ok, NO MILK/CREAM OR CREAMERS) regular and decaf                             Plain Jell-O (NO RED)                                           Fruit ices (not with fruit pulp, NO RED)                                     Popsicles (NO RED)                                                                  Juice: apple, WHITE grape, WHITE cranberry Sports drinks like Gatorade (NO RED)   The day of surgery:  Drink ONE (1) Pre-Surgery Clear G2 at : 10:00 AM the morning of surgery. Drink in one sitting. Do not sip.  This drink was given to you during your hospital  pre-op appointment visit. Nothing else to drink after completing the  Pre-Surgery Clear Ensure or G2.          If you have questions, please contact your surgeon's office.  FOLLOW ANY ADDITIONAL PRE OP INSTRUCTIONS YOU RECEIVED FROM YOUR SURGEON'S OFFICE!!!   Oral  Hygiene is also important to reduce your risk of infection.                                    Remember - BRUSH YOUR TEETH THE MORNING OF SURGERY WITH YOUR REGULAR TOOTHPASTE  DENTURES WILL BE REMOVED PRIOR TO SURGERY PLEASE DO NOT APPLY Poly grip OR ADHESIVES!!!   Do NOT smoke after Midnight   Take these medicines the morning of surgery  with A SIP OF WATER: cetirizine.  How to Manage Your Diabetes Before and After Surgery  Why is it important to control my blood sugar before and after surgery? Improving blood sugar levels before and after surgery helps healing and can limit problems. A way of improving blood sugar control is eating a healthy diet by:  Eating less sugar and carbohydrates  Increasing activity/exercise  Talking with your doctor about reaching your blood sugar goals High blood sugars (greater than 180 mg/dL) can raise your risk of infections and slow your recovery, so you will need to focus on controlling your diabetes during the weeks before surgery. Make sure that the doctor who takes care of your diabetes knows about your planned surgery including the date and location.  How do I manage my blood sugar before surgery? Check your blood sugar at least 4 times a day, starting 2 days before surgery, to make sure that the level is not too high or low. Check your blood sugar the morning of your surgery when you wake up and every 2 hours until you get to the Short Stay unit. If your blood sugar is less than 70 mg/dL, you will need to treat for low blood sugar: Do not take insulin . Treat a low blood sugar (less than 70 mg/dL) with  cup of clear juice (cranberry or apple), 4 glucose tablets, OR glucose gel. Recheck blood sugar in 15 minutes after treatment (to make sure it is greater than 70 mg/dL). If your blood sugar is not greater than 70 mg/dL on recheck, call [phone] for further instructions. Report your blood sugar to the short stay nurse when you get to Short  Stay.  If you are admitted to the hospital after surgery: Your blood sugar will be checked by the staff and you will probably be given insulin  after surgery (instead of oral diabetes medicines) to make sure you have good blood sugar levels. The goal for blood sugar control after surgery is 80-180 mg/dL.   WHAT DO I DO ABOUT MY DIABETES MEDICATION?  THE MORNING OF SURGERY, DO NOT TAKE ANY ORAL DIABETIC MEDICATIONS DAY OF YOUR SURGERY  DO NOT TAKE THE FOLLOWING 7 DAYS PRIOR TO SURGERY: Ozempic , Wegovy, Rybelsus (Semaglutide ), Byetta (exenatide), Bydureon (exenatide ER), Victoza, Saxenda (liraglutide), or Trulicity (dulaglutide) Mounjaro (Tirzepatide) Adlyxin (Lixisenatide), Polyethylene Glycol Loxenatide.HOLD Mounjaro after: 04/10/23  Bring CPAP mask and tubing day of surgery.                              You may not have any metal on your body including hair pins, jewelry, and body piercing             Do not wear lotions, powders, perfumes/cologne, or deodorant              Men may shave face and neck.   Do not bring valuables to the hospital. [facility] IS NOT             RESPONSIBLE   FOR VALUABLES.   Contacts, glasses, or bridgework may not be worn into surgery.   Bring small overnight bag day of surgery.   DO NOT BRING YOUR HOME MEDICATIONS TO THE HOSPITAL. PHARMACY WILL DISPENSE MEDICATIONS LISTED ON YOUR MEDICATION LIST TO YOU DURING YOUR ADMISSION IN THE HOSPITAL!    Patients discharged on the day of surgery will not be allowed to drive home.  Someone NEEDS to stay with you for  the first 24 hours after anesthesia.   Special Instructions: Bring a copy of your healthcare power of attorney and living will documents         the day of surgery if you haven't scanned them before.              Please read over the following fact sheets you were given: IF YOU HAVE QUESTIONS ABOUT YOUR PRE-OP INSTRUCTIONS PLEASE CALL [phone]      Pre-operative 5 CHG Bath Instructions   You  can play a key role in reducing the risk of infection after surgery. Your skin needs to be as free of germs as possible. You can reduce the number of germs on your skin by washing with CHG (chlorhexidine  gluconate) soap before surgery. CHG is an antiseptic soap that kills germs and continues to kill germs even after washing.   DO NOT use if you have an allergy to chlorhexidine /CHG or antibacterial soaps. If your skin becomes reddened or irritated, stop using the CHG and notify one of our RNs at : [phone].   Please shower with the CHG soap starting 4 days before surgery using the following schedule:     Please keep in mind the following:  DO NOT shave, including legs and underarms, starting the day of your first shower.   You may shave your face at any point before/day of surgery.  Place clean sheets on your bed the day you start using CHG soap. Use a clean washcloth (not used since being washed) for each shower. DO NOT sleep with pets once you start using the CHG.   CHG Shower Instructions:  If you choose to wash your hair and private area, wash first with your normal shampoo/soap.  After you use shampoo/soap, rinse your hair and body thoroughly to remove shampoo/soap residue.  Turn the water OFF and apply about 3 tablespoons (45 ml) of CHG soap to a CLEAN washcloth.  Apply CHG soap ONLY FROM YOUR NECK DOWN TO YOUR TOES (washing for 3-5 minutes)  DO NOT use CHG soap on face, private areas, open wounds, or sores.  Pay special attention to the area where your surgery is being performed.  If you are having back surgery, having someone wash your back for you may be helpful. Wait 2 minutes after CHG soap is applied, then you may rinse off the CHG soap.  Pat dry with a clean towel  Put on clean clothes/pajamas   If you choose to wear lotion, please use ONLY the CHG-compatible lotions on the back of this paper.     Additional instructions for the day of surgery: DO NOT APPLY any lotions,  deodorants, cologne, or perfumes.   Put on clean/comfortable clothes.  Brush your teeth.  Ask your nurse before applying any prescription medications to the skin.   CHG Compatible Lotions   Aveeno Moisturizing lotion  Cetaphil Moisturizing Cream  Cetaphil Moisturizing Lotion  Clairol Herbal Essence Moisturizing Lotion, Dry Skin  Clairol Herbal Essence Moisturizing Lotion, Extra Dry Skin  Clairol Herbal Essence Moisturizing Lotion, Normal Skin  Curel Age Defying Therapeutic Moisturizing Lotion with Alpha Hydroxy  Curel Extreme Care Body Lotion  Curel Soothing Hands Moisturizing Hand Lotion  Curel Therapeutic Moisturizing Cream, Fragrance-Free  Curel Therapeutic Moisturizing Lotion, Fragrance-Free  Curel Therapeutic Moisturizing Lotion, Original Formula  Eucerin Daily Replenishing Lotion  Eucerin Dry Skin Therapy Plus Alpha Hydroxy Crme  Eucerin Dry Skin Therapy Plus Alpha Hydroxy Lotion  Eucerin Original Crme  Eucerin Original Lotion  Eucerin Plus Crme Eucerin Plus Lotion  Eucerin TriLipid Replenishing Lotion  Keri Anti-Bacterial Hand Lotion  Keri Deep Conditioning Original Lotion Dry Skin Formula Softly Scented  Keri Deep Conditioning Original Lotion, Fragrance Free Sensitive Skin Formula  Keri Lotion Fast Absorbing Fragrance Free Sensitive Skin Formula  Keri Lotion Fast Absorbing Softly Scented Dry Skin Formula  Keri Original Lotion  Keri Skin Renewal Lotion Keri Silky Smooth Lotion  Keri Silky Smooth Sensitive Skin Lotion  Nivea Body Creamy Conditioning Oil  Nivea Body Extra Enriched Lotion  Nivea Body Original Lotion  Nivea Body Sheer Moisturizing Lotion Nivea Crme  Nivea Skin Firming Lotion  NutraDerm 30 Skin Lotion  NutraDerm Skin Lotion  NutraDerm Therapeutic Skin Cream  NutraDerm Therapeutic Skin Lotion  ProShield Protective Hand Cream  Provon moisturizing lotion   Incentive Spirometer  An incentive spirometer is a tool that can help keep your lungs clear  and active. This tool measures how well you are filling your lungs with each breath. Taking long deep breaths may help reverse or decrease the chance of developing breathing (pulmonary) problems (especially infection) following: A long period of time when you are unable to move or be active. BEFORE THE PROCEDURE  If the spirometer includes an indicator to show your best effort, your nurse or respiratory therapist will set it to a desired goal. If possible, sit up straight or lean slightly forward. Try not to slouch. Hold the incentive spirometer in an upright position. INSTRUCTIONS FOR USE  Sit on the edge of your bed if possible, or sit up as far as you can in bed or on a chair. Hold the incentive spirometer in an upright position. Breathe out normally. Place the mouthpiece in your mouth and seal your lips tightly around it. Breathe in slowly and as deeply as possible, raising the piston or the ball toward the top of the column. Hold your breath for 3-5 seconds or for as long as possible. Allow the piston or ball to fall to the bottom of the column. Remove the mouthpiece from your mouth and breathe out normally. Rest for a few seconds and repeat Steps 1 through 7 at least 10 times every 1-2 hours when you are awake. Take your time and take a few normal breaths between deep breaths. The spirometer may include an indicator to show your best effort. Use the indicator as a goal to work toward during each repetition. After each set of 10 deep breaths, practice coughing to be sure your lungs are clear. If you have an incision (the cut made at the time of surgery), support your incision when coughing by placing a pillow or rolled up towels firmly against it. Once you are able to get out of bed, walk around indoors and cough well. You may stop using the incentive spirometer when instructed by your caregiver.  RISKS AND COMPLICATIONS Take your time so you do not get dizzy or light-headed. If you are in  pain, you may need to take or ask for pain medication before doing incentive spirometry. It is harder to take a deep breath if you are having pain. AFTER USE Rest and breathe slowly and easily. It can be helpful to keep track of a log of your progress. Your caregiver can provide you with a simple table to help with this. If you are using the spirometer at home, follow these instructions: SEEK MEDICAL CARE IF:  You are having difficultly using the spirometer. You have trouble using the spirometer as often as  instructed. Your pain medication is not giving enough relief while using the spirometer. You develop fever of 100.5 F (38.1 C) or higher. SEEK IMMEDIATE MEDICAL CARE IF:  You cough up bloody sputum that had not been present before. You develop fever of 102 F (38.9 C) or greater. You develop worsening pain at or near the incision site. MAKE SURE YOU:  Understand these instructions. Will watch your condition. Will get help right away if you are not doing well or get worse. Document Released: 07/29/2006 Document Revised: 06/10/2011 Document Reviewed: 09/29/2006 [facility] Patient Information 2014 [place], [place].   ________________________________________________________________________

## 2023-04-07 ENCOUNTER — Other Ambulatory Visit: Payer: Self-pay

## 2023-04-07 ENCOUNTER — Encounter (HOSPITAL_COMMUNITY): Payer: Self-pay

## 2023-04-07 ENCOUNTER — Encounter (HOSPITAL_COMMUNITY)
Admission: RE | Admit: 2023-04-07 | Discharge: 2023-04-07 | Disposition: A | Discharge status: Home / Self Care | Payer: No Typology Code available for payment source | Source: Ambulatory Visit | Attending: Orthopedic Surgery

## 2023-04-07 VITALS — BP 153/80 | HR 91 | Temp 97.7°F | Ht 74.0 in | Wt 266.0 lb

## 2023-04-07 DIAGNOSIS — I110 Hypertensive heart disease with heart failure: Secondary | ICD-10-CM | POA: Insufficient documentation

## 2023-04-07 DIAGNOSIS — E785 Hyperlipidemia, unspecified: Secondary | ICD-10-CM | POA: Diagnosis not present

## 2023-04-07 DIAGNOSIS — Z951 Presence of aortocoronary bypass graft: Secondary | ICD-10-CM | POA: Diagnosis not present

## 2023-04-07 DIAGNOSIS — I10 Essential (primary) hypertension: Secondary | ICD-10-CM | POA: Insufficient documentation

## 2023-04-07 DIAGNOSIS — G4730 Sleep apnea, unspecified: Secondary | ICD-10-CM | POA: Diagnosis not present

## 2023-04-07 DIAGNOSIS — M1612 Unilateral primary osteoarthritis, left hip: Secondary | ICD-10-CM | POA: Insufficient documentation

## 2023-04-07 DIAGNOSIS — I509 Heart failure, unspecified: Secondary | ICD-10-CM | POA: Insufficient documentation

## 2023-04-07 DIAGNOSIS — Z01818 Encounter for other preprocedural examination: Secondary | ICD-10-CM | POA: Insufficient documentation

## 2023-04-07 DIAGNOSIS — E1169 Type 2 diabetes mellitus with other specified complication: Secondary | ICD-10-CM | POA: Insufficient documentation

## 2023-04-07 DIAGNOSIS — Z79899 Other long term (current) drug therapy: Secondary | ICD-10-CM | POA: Insufficient documentation

## 2023-04-07 DIAGNOSIS — Z7984 Long term (current) use of oral hypoglycemic drugs: Secondary | ICD-10-CM | POA: Insufficient documentation

## 2023-04-07 HISTORY — DX: Unspecified osteoarthritis, unspecified site: M19.90

## 2023-04-07 HISTORY — DX: Personal history of urinary calculi: Z87.442

## 2023-04-07 HISTORY — DX: Unspecified asthma, uncomplicated: J45.909

## 2023-04-07 LAB — CBC
HCT: 53.5 % — ABNORMAL HIGH (ref 39.0–52.0)
Hemoglobin: 17.7 g/dL — ABNORMAL HIGH (ref 13.0–17.0)
MCH: 30.4 pg (ref 26.0–34.0)
MCHC: 33.1 g/dL (ref 30.0–36.0)
MCV: 91.9 fL (ref 80.0–100.0)
Platelets: 228 10*3/uL (ref 150–400)
RBC: 5.82 MIL/uL — ABNORMAL HIGH (ref 4.22–5.81)
RDW: 13.9 % (ref 11.5–15.5)
WBC: 9.7 10*3/uL (ref 4.0–10.5)
nRBC: 0 % (ref 0.0–0.2)

## 2023-04-07 LAB — TYPE AND SCREEN
ABO/RH(D): O POS
Antibody Screen: NEGATIVE

## 2023-04-07 LAB — BASIC METABOLIC PANEL
Anion gap: 12 (ref 5–15)
BUN: 23 mg/dL (ref 8–23)
CO2: 23 mmol/L (ref 22–32)
Calcium: 9.7 mg/dL (ref 8.9–10.3)
Chloride: 104 mmol/L (ref 98–111)
Creatinine, Ser: 1.29 mg/dL — ABNORMAL HIGH (ref 0.61–1.24)
GFR, Estimated: >60 mL/min (ref >60)
Glucose, Bld: 121 mg/dL — ABNORMAL HIGH (ref 70–99)
Potassium: 3.9 mmol/L (ref 3.5–5.1)
Sodium: 139 mmol/L (ref 135–145)

## 2023-04-07 LAB — SURGICAL PCR SCREEN
MRSA, PCR: NEGATIVE
Staphylococcus aureus: NEGATIVE

## 2023-04-07 NOTE — Progress Notes (Signed)
 For Anesthesia: PCP - Sophronia Ozell BROCKS, MD  Cardiologist - Rolan Ezra RAMAN, MD . ARNETTA: 12/13/22  Bowel Prep reminder:  Chest x-ray -  EKG - 04/07/23 Stress Test -  ECHO - 10/22/18 Cardiac Cath - 08/25/20 Pacemaker/ICD device last checked: Pacemaker orders received: Device Rep notified:  Spinal Cord Stimulator:N/A  Sleep Study - Yes CPAP - Yes  Fasting Blood Sugar - N/A Checks Blood Sugar ____0_ times a day Date and result of last Hgb A1c-  Last dose of GLP1 agonist- four months ago: mounjaro GLP1 instructions:   Last dose of SGLT-2 inhibitors- N/A SGLT-2 instructions:   Blood Thinner Instructions:N/A Aspirin  Instructions: No instructions yet. Last Dose:  Activity level: Can go up a flight of stairs and activities of daily living without stopping and without chest pain and/or shortness of breath   Able to exercise without chest pain and/or shortness of breath  Anesthesia review: Hx: CAD,HTN,OSA(CPAP),CHF,MI,CABG x 4  Patient denies shortness of breath, fever, cough and chest pain at PAT appointment   Patient verbalized understanding of instructions that were given to them at the PAT appointment. Patient was also instructed that they will need to review over the PAT instructions again at home before surgery.

## 2023-04-07 NOTE — Progress Notes (Signed)
 As per pt. He had done few EKG in the last year,records were requested from Dr. Alford [place] office,last EKG was done in 2023. Pt. Will need an EKG done the DOS.

## 2023-04-08 LAB — HEMOGLOBIN A1C
Hgb A1c MFr Bld: 6.5 % — ABNORMAL HIGH (ref 4.8–5.6)
Mean Plasma Glucose: 140 mg/dL

## 2023-04-08 NOTE — Progress Notes (Addendum)
 Anesthesia Chart Review   Case: [id number] Date/Time: 04/18/23 1245   Procedure: TOTAL HIP ARTHROPLASTY ANTERIOR APPROACH (Left: Hip)   Anesthesia type: Spinal   Pre-op diagnosis: Left Hip osteoarthritis   Location: [facility] ROOM 08 / [facility]   Surgeons: Ernie Cough, MD       DISCUSSION:62 y.o. never smoker with h/o HTN, asthma, sleep apnea w/CPAP, CAD s/p CABG x4 2020, CHF, left hip OA scheduled for above procedure 04/18/2023 with Dr. Cough Ernie.   Pt last seen by cardiology 12/13/2022. Per OV note, BP elevated, amlodipine  increased, no exertional angina. 6 month follow up recommended.   Per cardiology preoperative evaluation 04/15/2023, According to the Revised Cardiac Risk Index (RCRI), his Perioperative Risk of Major Cardiac Event is (%): 0.9   His Functional Capacity in METs is: 9.89 according to the Duke Activity Status Index (DASI).    The patient was advised that if he develops new symptoms prior to surgery to contact our office to arrange for a follow-up visit, and he verbalized understanding.   Per office protocol, if patient is without any new symptoms or concerns at the time of their virtual visit, he may hold ASA for 7 days prior to procedure. Please resume ASA as soon as possible postprocedure, at the discretion of the surgeon.     Therefore, based on ACC/AHA guidelines, patient would be at acceptable risk for the planned procedure without further cardiovascular testing. I will route this recommendation to the requesting party via Epic fax function.  VS: BP (!) 153/80   Pulse 91   Temp 36.5 C   Ht 6' 2 (1.88 m)   Wt 120.7 kg   SpO2 96%   BMI 34.15 kg/m   PROVIDERS: Sophronia Ozell BROCKS, MD is PCP   Rolan Barrack, MD is Cardiologist  LABS: Labs reviewed: Acceptable for surgery. (all labs ordered are listed, but only abnormal results are displayed)  Labs Reviewed  HEMOGLOBIN A1C - Abnormal; Notable for the following components:      Result Value   Hgb A1c MFr Bld  6.5 (*)    All other components within normal limits  BASIC METABOLIC PANEL - Abnormal; Notable for the following components:   Glucose, Bld 121 (*)    Creatinine, Ser 1.29 (*)    All other components within normal limits  CBC - Abnormal; Notable for the following components:   RBC 5.82 (*)    Hemoglobin 17.7 (*)    HCT 53.5 (*)    All other components within normal limits  SURGICAL PCR SCREEN  TYPE AND SCREEN     IMAGES:   EKG:   CV: Cardiac Cath 08/25/2020 1.  Severe native coronary artery disease with patency of the left mainstem, severe stenosis of the LAD and first diagonal branches, severe stenosis of the distal left circumflex, and moderate diffuse stenosis of the mid RCA through the PDA branch 2.  Status post aortocoronary bypass surgery with continued patency of the LIMA to LAD, saphenous vein graft to first diagonal, and free left radial graft to the left posterolateral branch.  The RIMA to PDA is atretic. 3.  Negative RFR and FFR evaluation of the native right coronary artery, appropriate for medical therapy 4.  Normal LVEDP   Recommendations: There is no hemodynamically significant stenosis in the RCA with a pressure wire in the distal portion of the PDA not meeting criteria for hemodynamic significance.  Recommend medical therapy of coronary artery disease.  Echo 10/21/2018 1. The left ventricle has  normal systolic function with an ejection  fraction of 60-65%. The cavity size was normal. There is moderately  increased left ventricular wall thickness. Left ventricular diastolic  Doppler parameters are consistent with  pseudonormalization.   2. The right ventricle has normal systolic function. The cavity was  normal. There is no increase in right ventricular wall thickness.   3. The aortic valve is tricuspid. Moderate thickening of the aortic  valve. Moderate calcification of the aortic valve.   4. The aorta is normal in size and structure.  Past Medical History:   Diagnosis Date   Acute duodenal ulcer with hemorrhage    Adenomatous polyp    Allergy    Arthritis    Asthma    CHF (congestive heart failure) (HCC)    Chickenpox    Diverticular hemorrhage - recurrent 12/26/2012   Diverticulitis    Erythrocytosis 11/25/2018   Fracture of left distal radius 04/22/2013   Hepatitis A    as a child, no current liver problems   History of kidney stones    Hyperlipidemia    Hypertension    he also sees Dr. Heddie in [place], [place]    Lower GI bleed    Hx: of   Myocardial infarction (HCC)    Obesity    Pneumonia    Prediabetes    tx metformin    Schatzki's ring of distal esophagus    Sleep apnea 2003   uses CPAP machine, pt does not know settings    Past Surgical History:  Procedure Laterality Date   APPENDECTOMY   10-80yrs ago   BIOPSY  11/10/2018   Procedure: BIOPSY;  Surgeon: San Sandor GAILS, DO;  [facility];  Service: Gastroenterology;;   CARPAL TUNNEL RELEASE Left    CLOSED REDUCTION FINGER WITH PERCUTANEOUS PINNING Left 04/22/2013   Procedure: LEFT WRIST CLOSED REDUCTION CONVERTED TO OPEN REDUCTION INTERNAL FIXATION, LEFT CARPAL TUNNEL RELEASE;  Surgeon: Prentice LELON Pagan, MD;  Location: [facility];  Service: Orthopedics;  Laterality: Left;   COLONOSCOPY  07/09/2009   benign polyp repeat 5 years, Dr.Sam Ganem   COLONOSCOPY  05/20/2016   CORONARY ARTERY BYPASS GRAFT N/A 10/22/2018   Procedure: CORONARY ARTERY BYPASS GRAFTING (CABG) times four using right and left internal mammary arteries, left radial artery, and right greater saphenous vein harvested endoscopically.;  Surgeon: German Bartlett PEDLAR, MD;  Location: [facility];  Service: Open Heart Surgery;  Laterality: N/A;   CORONARY ARTERY BYPASS GRAFT     CORONARY PRESSURE/FFR STUDY N/A 08/25/2020   Procedure: INTRAVASCULAR PRESSURE WIRE/FFR STUDY;  Surgeon: Wonda Sharper, MD;  Location: [facility] INVASIVE CV LAB;  Service: Cardiovascular;  Laterality: N/A;   CORONARY STENT INTERVENTION N/A  08/25/2020   Procedure: CORONARY STENT INTERVENTION;  Surgeon: Wonda Sharper, MD;  Location: [facility] INVASIVE CV LAB;  Service: Cardiovascular;  Laterality: N/A;   CORONARY/GRAFT ACUTE MI REVASCULARIZATION N/A 10/21/2018   Procedure: Coronary/Graft Acute MI Revascularization;  Surgeon: Claudene Victory LELON, MD;  [facility];  Service: Cardiovascular;  Laterality: N/A;   ESOPHAGOGASTRODUODENOSCOPY (EGD) WITH PROPOFOL  N/A 11/10/2018   Procedure: ESOPHAGOGASTRODUODENOSCOPY (EGD) WITH PROPOFOL ;  Surgeon: San Sandor GAILS, DO;  [facility];  Service: Gastroenterology;  Laterality: N/A;   FRACTURE SURGERY Left    HERNIA REPAIR     repaired x 4   INSERTION OF MESH  04/09/2012   Procedure: INSERTION OF MESH;  Surgeon: Krystal CHRISTELLA Spinner, MD;  [facility];  Service: General;  Laterality: N/A;   LEFT HEART CATH  AND CORONARY ANGIOGRAPHY N/A 10/21/2018   Procedure: LEFT HEART CATH AND CORONARY ANGIOGRAPHY;  Surgeon: Claudene Victory ORN, MD;  Location[facility] INVASIVE CV LAB;  Service: Cardiovascular;  Laterality: N/A;   LEFT HEART CATH AND CORS/GRAFTS ANGIOGRAPHY N/A 08/25/2020   Procedure: LEFT HEART CATH AND CORS/GRAFTS ANGIOGRAPHY;  Surgeon: Wonda Sharper, MD;  Location: [facility] INVASIVE CV LAB;  Service: Cardiovascular;  Laterality: N/A;   RADIAL ARTERY HARVEST Left 10/22/2018   Procedure: LEFT RADIAL ARTERY HARVEST;  Surgeon: German Bartlett PEDLAR, MD;  [facility];  Service: Open Heart Surgery;  Laterality: Left;   TEE WITHOUT CARDIOVERSION N/A 10/22/2018   Procedure: TRANSESOPHAGEAL ECHOCARDIOGRAM (TEE);  Surgeon: German Bartlett PEDLAR, MD;  Location: [facility] OR;  Service: Open Heart Surgery;  Laterality: N/A;   TONSILLECTOMY  as child   UVULECTOMY  yrs ago   for snoring   VENTRAL HERNIA REPAIR  04/09/2012   Procedure: LAPAROSCOPIC VENTRAL HERNIA;  Surgeon: Krystal CHRISTELLA Spinner, MD;  [facility];  Service: General;  Laterality: N/A;  Laparoscopic Ventral Incisional Hernia Repair with Mesh     MEDICATIONS:  amLODipine  (NORVASC ) 5 MG tablet   Ascorbic Acid  (VITAMIN C  CR) 1000 MG TBCR   aspirin  EC 81 MG tablet   cetirizine (ZYRTEC) 10 MG tablet   Cholecalciferol  (DIALYVITE VITAMIN D  5000 PO)   Coenzyme Q10 (CO Q 10 PO)   Cyanocobalamin (VITAMIN B 12 PO)   Evolocumab  (REPATHA  SURECLICK) 140 MG/ML SOAJ   GARLIC PO   hydrochlorothiazide  (MICROZIDE ) 12.5 MG capsule   LYSINE PO   Magnesium  Oxide 420 MG TABS   metFORMIN  (GLUCOPHAGE ) 1000 MG tablet   MOUNJARO 5 MG/0.5ML Pen   OVER THE COUNTER MEDICATION   OVER THE COUNTER MEDICATION   OVER THE COUNTER MEDICATION   Prasterone, DHEA, (DHEA PO)   Probiotic Product (PROBIOTIC PO)   rosuvastatin  (CRESTOR ) 5 MG tablet   Zinc 30 MG TABS   No current facility-administered medications for this encounter.    Harlene Hoots Ward, PA-C [facility] [phone]

## 2023-04-09 ENCOUNTER — Telehealth: Payer: Self-pay

## 2023-04-09 ENCOUNTER — Telehealth: Payer: Self-pay | Admitting: Cardiology

## 2023-04-09 NOTE — Telephone Encounter (Signed)
   Pre-operative Risk Assessment    Patient Name: Oscar [name]  DOB: 28-Jul-1960 MRN: [id number]   Date of last office visit: 12/13/22 Date of next office visit: Not scheduled   Request for Surgical Clearance    Procedure:   Left total hip arthroplasty  Date of Surgery:  Clearance 04/18/23                                Surgeon:  Dr. Donnice Car Surgeon's Group or Practice Name:  [facility] Phone number:  [phone] Fax number:  [phone]   Type of Clearance Requested:   - Medical  - Pharmacy:  Hold Aspirin      Type of Anesthesia:  Spinal   Additional requests/questions:    SignedIval LOISE Collet   04/09/2023, 10:54 AM

## 2023-04-09 NOTE — Telephone Encounter (Signed)
   Name: Oscar [name]  DOB: 1960-05-25  MRN: [id number]  Primary Cardiologist: Redell Shallow, MD   Preoperative team, please contact this patient and set up a phone call appointment for further preoperative risk assessment. Please obtain consent and complete medication review. Thank you for your help.  Please ask patient to stop aspirin  on Friday 1/10. Virtual visit can be early next week.   I confirm that guidance regarding antiplatelet and oral anticoagulation therapy has been completed and, if necessary, noted below.  Ideally aspirin  should be continued without interruption, however if the bleeding risk is too great, aspirin  may be held for 5-7 days prior to surgery. Please resume aspirin  post operatively when it is felt to be safe from a bleeding standpoint.    I also confirmed the patient resides in the state of [place] . As per [facility] Medical Board telemedicine laws, the patient must reside in the state in which the provider is licensed.   Barnie Hila, NP 04/09/2023, 11:24 AM [facility]

## 2023-04-09 NOTE — Telephone Encounter (Signed)
 1st attempt to reach pt regarding surgical clearance and the need for an TELE appointment.  Left pt a detailed message to call back and get that scheduled.

## 2023-04-09 NOTE — Telephone Encounter (Signed)
 [facility] called because patient is requesting a travel size CPAP machine and his at home machine is going out as well. The representative (I failed to get his name but will call back if necessary) states that he called Dr. Orvilla office to get prescription, he was told to contact Dr. Dorine office for the prescription. I don't see that the patient has ever seen Dr. Shlomo or had a sleep study that was read by her. He did see Dr. Burnard in July 2020 but that looks like a gen card appointment from what I can tell. I advised that the patient would need an appt with Dr. Shlomo and his sleep study records in order for Dr. Shlomo to send in new prescriptions. Representative understood and will relay message to pt. FYI

## 2023-04-11 ENCOUNTER — Telehealth: Payer: Self-pay | Admitting: *Deleted

## 2023-04-11 NOTE — Telephone Encounter (Signed)
2nd attempt to reach pt to schedule tele pre op appt.

## 2023-04-11 NOTE — Telephone Encounter (Signed)
 Pt has been scheduled tele preop appt 04/15/23 ok per pre op APP to add pt on next week. Pt states he took his last dose of ASA yesterday. Pt aware to hold ASA until after procedure when surgeon will advise when safe to resume ASA.   Med rec and consent are done.      Patient Consent for Virtual Visit        Oscar [name] has provided verbal consent on 04/11/2023 for a virtual visit (video or telephone).   CONSENT FOR VIRTUAL VISIT FOR:  Oscar [name]  By participating in this virtual visit I agree to the following:  I hereby voluntarily request, consent and authorize [facility] and its employed or contracted physicians, physician assistants, nurse practitioners or other licensed health care professionals (the Practitioner), to provide me with telemedicine health care services (the "Services) as deemed necessary by the treating Practitioner. I acknowledge and consent to receive the Services by the Practitioner via telemedicine. I understand that the telemedicine visit will involve communicating with the Practitioner through live audiovisual communication technology and the disclosure of certain medical information by electronic transmission. I acknowledge that I have been given the opportunity to request an in-person assessment or other available alternative prior to the telemedicine visit and am voluntarily participating in the telemedicine visit.  I understand that I have the right to withhold or withdraw my consent to the use of telemedicine in the course of my care at any time, without affecting my right to future care or treatment, and that the Practitioner or I may terminate the telemedicine visit at any time. I understand that I have the right to inspect all information obtained and/or recorded in the course of the telemedicine visit and may receive copies of available information for a reasonable fee.  I understand that some of the potential risks of receiving the Services via  telemedicine include:  Delay or interruption in medical evaluation due to technological equipment failure or disruption; Information transmitted may not be sufficient (e.g. poor resolution of images) to allow for appropriate medical decision making by the Practitioner; and/or  In rare instances, security protocols could fail, causing a breach of personal health information.  Furthermore, I acknowledge that it is my responsibility to provide information about my medical history, conditions and care that is complete and accurate to the best of my ability. I acknowledge that Practitioner's advice, recommendations, and/or decision may be based on factors not within their control, such as incomplete or inaccurate data provided by me or distortions of diagnostic images or specimens that may result from electronic transmissions. I understand that the practice of medicine is not an exact science and that Practitioner makes no warranties or guarantees regarding treatment outcomes. I acknowledge that a copy of this consent can be made available to me via my patient portal [facility] MyChart), or I can request a printed copy by calling the office of [facility].    I understand that my insurance will be billed for this visit.   I have read or had this consent read to me. I understand the contents of this consent, which adequately explains the benefits and risks of the Services being provided via telemedicine.  I have been provided ample opportunity to ask questions regarding this consent and the Services and have had my questions answered to my satisfaction. I give my informed consent for the services to be provided through the use of telemedicine in my medical care

## 2023-04-11 NOTE — Telephone Encounter (Signed)
 Pt has been scheduled tele preop appt 04/15/23 ok per pre op APP to add pt on next week. Pt states he took his last dose of ASA yesterday. Pt aware to hold ASA until after procedure when surgeon will advise when safe to resume ASA.   Med rec and consent are done.

## 2023-04-11 NOTE — Telephone Encounter (Signed)
 Patient is returning call and is requesting call back.

## 2023-04-15 ENCOUNTER — Ambulatory Visit: Payer: No Typology Code available for payment source | Attending: Cardiology

## 2023-04-15 DIAGNOSIS — Z01810 Encounter for preprocedural cardiovascular examination: Secondary | ICD-10-CM | POA: Diagnosis not present

## 2023-04-15 DIAGNOSIS — Z01818 Encounter for other preprocedural examination: Secondary | ICD-10-CM

## 2023-04-15 NOTE — Progress Notes (Signed)
 Virtual Visit via Telephone Note   Because of Oscar [name] co-morbid illnesses, he is at least at moderate risk for complications without adequate follow up.  This format is felt to be most appropriate for this patient at this time.  The patient did not have access to video technology/had technical difficulties with video requiring transitioning to audio format only (telephone).  All issues noted in this document were discussed and addressed.  No physical exam could be performed with this format.  Please refer to the patient's chart for his consent to telehealth for [facility].  Evaluation Performed:  Preoperative cardiovascular risk assessment _____________   Date:  04/15/2023   Patient ID:  Oscar [name], DOB 04-05-1960, MRN [id number] Patient Location:  Home Provider location:   Office  Primary Care Provider:  Sophronia Ozell BROCKS, MD Primary Cardiologist:  Redell Shallow, MD / Dr. Rolan  Chief Complaint / Patient Profile   63 y.o. y/o male with a h/o CAD status post CABG x 4 in 09/2018, most recent cardiac catheterization May 2022 revealed atretic RIMA to RCA with moderate RCA disease, however FFR and RFR of the RCA were negative so he is medically managed.  Other history includes OSA on CPAP, hyperlipidemia, hypertension, postoperative atrial fibrillation, peptic ulcer disease, type 2 diabetes.   He is pending left total hip arthroplasty on 04/18/2023 by Dr. Donnice Car, and recommendations for holding aspirin  and presents today for telephonic preoperative cardiovascular risk assessment.   History of Present Illness    Oscar [name] is a 63 y.o. male who presents via audio/video conferencing for a telehealth visit today.  Pt was last seen in cardiology clinic on 12/13/2022 by Dr. Rolan.  At that time Oscar [name] was doing well .  The patient is now pending procedure as outlined above. Since his last visit, he has been doing well and goes to a gym doing seated bike or  treadmill despite his hip pain. He travels a lot internationally and participates in long walks, swimming, plays pickle ball.  He offers no cardiac complaints whatsoever, blood pressure is been well-controlled he takes it daily.  He denies any palpitations or heart racing.  He is medically compliant.  Of note, his med list had him on a GLP 1.  He is no longer taking this type of medicine.  I did remove it from his list.  Past Medical History    Past Medical History:  Diagnosis Date   Acute duodenal ulcer with hemorrhage    Adenomatous polyp    Allergy    Arthritis    Asthma    CHF (congestive heart failure) (HCC)    Chickenpox    Diverticular hemorrhage - recurrent 12/26/2012   Diverticulitis    Erythrocytosis 11/25/2018   Fracture of left distal radius 04/22/2013   Hepatitis A    as a child, no current liver problems   History of kidney stones    Hyperlipidemia    Hypertension    he also sees Dr. Heddie in [place], [place]    Lower GI bleed    Hx: of   Myocardial infarction (HCC)    Obesity    Pneumonia    Prediabetes    tx metformin    Schatzki's ring of distal esophagus    Sleep apnea 2003   uses CPAP machine, pt does not know settings   Past Surgical History:  Procedure Laterality Date   APPENDECTOMY   10-66yrs ago   BIOPSY  11/10/2018   Procedure: BIOPSY;  Surgeon: San Sandor GAILS, DO;  Location: [facility];  Service: Gastroenterology;;   CARPAL TUNNEL RELEASE Left    CLOSED REDUCTION FINGER WITH PERCUTANEOUS PINNING Left 04/22/2013   Procedure: LEFT WRIST CLOSED REDUCTION CONVERTED TO OPEN REDUCTION INTERNAL FIXATION, LEFT CARPAL TUNNEL RELEASE;  Surgeon: Prentice LELON Pagan, MD;  Location: [facility] OR;  Service: Orthopedics;  Laterality: Left;   COLONOSCOPY  07/09/2009   benign polyp repeat 5 years, Dr.Sam Ganem   COLONOSCOPY  05/20/2016   CORONARY ARTERY BYPASS GRAFT N/A 10/22/2018   Procedure: CORONARY ARTERY BYPASS GRAFTING (CABG) times four using right and left internal  mammary arteries, left radial artery, and right greater saphenous vein harvested endoscopically.;  Surgeon: German Bartlett PEDLAR, MD;  Location: [facility] OR;  Service: Open Heart Surgery;  Laterality: N/A;   CORONARY ARTERY BYPASS GRAFT     CORONARY PRESSURE/FFR STUDY N/A 08/25/2020   Procedure: INTRAVASCULAR PRESSURE WIRE/FFR STUDY;  Surgeon: Wonda Sharper, MD;  Location: [facility] INVASIVE CV LAB;  Service: Cardiovascular;  Laterality: N/A;   CORONARY STENT INTERVENTION N/A 08/25/2020   Procedure: CORONARY STENT INTERVENTION;  Surgeon: Wonda Sharper, MD;  Location: [facility] INVASIVE CV LAB;  Service: Cardiovascular;  Laterality: N/A;   CORONARY/GRAFT ACUTE MI REVASCULARIZATION N/A 10/21/2018   Procedure: Coronary/Graft Acute MI Revascularization;  Surgeon: Claudene Victory LELON, MD;  Location: [facility] INVASIVE CV LAB;  Service: Cardiovascular;  Laterality: N/A;   ESOPHAGOGASTRODUODENOSCOPY (EGD) WITH PROPOFOL  N/A 11/10/2018   Procedure: ESOPHAGOGASTRODUODENOSCOPY (EGD) WITH PROPOFOL ;  Surgeon: San Sandor GAILS, DO;  Location: [facility];  Service: Gastroenterology;  Laterality: N/A;   FRACTURE SURGERY Left    HERNIA REPAIR     repaired x 4   INSERTION OF MESH  04/09/2012   Procedure: INSERTION OF MESH;  Surgeon: Krystal CHRISTELLA Spinner, MD;  Location: [facility] ORS;  Service: General;  Laterality: N/A;   LEFT HEART CATH AND CORONARY ANGIOGRAPHY N/A 10/21/2018   Procedure: LEFT HEART CATH AND CORONARY ANGIOGRAPHY;  Surgeon: Claudene Victory LELON, MD;  Location: [facility] INVASIVE CV LAB;  Service: Cardiovascular;  Laterality: N/A;   LEFT HEART CATH AND CORS/GRAFTS ANGIOGRAPHY N/A 08/25/2020   Procedure: LEFT HEART CATH AND CORS/GRAFTS ANGIOGRAPHY;  Surgeon: Wonda Sharper, MD;  Location: [facility] INVASIVE CV LAB;  Service: Cardiovascular;  Laterality: N/A;   RADIAL ARTERY HARVEST Left 10/22/2018   Procedure: LEFT RADIAL ARTERY HARVEST;  Surgeon: German Bartlett PEDLAR, MD;  Location: [facility] OR;  Service: Open Heart Surgery;  Laterality: Left;   TEE WITHOUT  CARDIOVERSION N/A 10/22/2018   Procedure: TRANSESOPHAGEAL ECHOCARDIOGRAM (TEE);  Surgeon: German Bartlett PEDLAR, MD;  Location: [facility] OR;  Service: Open Heart Surgery;  Laterality: N/A;   TONSILLECTOMY  as child   UVULECTOMY  yrs ago   for snoring   VENTRAL HERNIA REPAIR  04/09/2012   Procedure: LAPAROSCOPIC VENTRAL HERNIA;  Surgeon: Krystal CHRISTELLA Spinner, MD;  Location: [facility] ORS;  Service: General;  Laterality: N/A;  Laparoscopic Ventral Incisional Hernia Repair with Mesh    Allergies  Allergies  Allergen Reactions   Acetaminophen  Other (See Comments)    Bleeding issues   Ibuprofen Other (See Comments)    Bleeding issues   Advair Diskus [Fluticasone-Salmeterol] Cough   Latex Itching    Home Medications    Prior to Admission medications   Medication Sig Start Date End Date Taking? Authorizing Provider  amLODipine  (NORVASC ) 5 MG tablet Take 1 tablet (5 mg total) by mouth at bedtime. Patient taking differently: Take 5 mg by mouth daily. 12/13/22   Rolan Ezra RAMAN, MD  Ascorbic Acid  (VITAMIN C  CR) 1000 MG TBCR Take 1,000 mg by mouth daily.    [provider]  aspirin  EC 81 MG tablet Take 1 tablet (81 mg total) by mouth daily. 10/27/18   Barrett, Erin R, PA-C  cetirizine (ZYRTEC) 10 MG tablet Take 10 mg by mouth daily as needed for allergies.    [provider]  Cholecalciferol  (DIALYVITE VITAMIN D  5000 PO) Take 10,000 Units by mouth.    [provider]  Coenzyme Q10 (CO Q 10 PO) Take 1 tablet by mouth daily in the afternoon.    [provider]  Cyanocobalamin (VITAMIN B 12 PO) Take 1 tablet by mouth daily.    [provider]  Evolocumab  (REPATHA  SURECLICK) 140 MG/ML SOAJ ADMINISTER 1 ML UNDER THE SKIN EVERY 14 DAYS 03/11/23   Rolan Ezra RAMAN, MD  GARLIC PO Take 1 capsule by mouth daily.    [provider]  hydrochlorothiazide  (MICROZIDE ) 12.5 MG capsule Take 12.5 mg by mouth daily.    [provider]  LYSINE PO Take 1 capsule by mouth  daily.    [provider]  Magnesium  Oxide 420 MG TABS Take 840 mg by mouth daily.    [provider]  metFORMIN  (GLUCOPHAGE ) 1000 MG tablet Take 1,000 mg by mouth 2 (two) times daily.    [provider]  OVER THE COUNTER MEDICATION Take 1 tablet by mouth daily. Primal defense probiotic tablet    [provider]  OVER THE COUNTER MEDICATION Take 1 tablet by mouth daily. Liver MD    [provider]  OVER THE COUNTER MEDICATION Take 5 mLs by mouth daily. My vital C    [provider]  Prasterone, DHEA, (DHEA PO) Take 35 mg by mouth daily.    [provider]  Probiotic Product (PROBIOTIC PO) Take 1 tablet by mouth in the morning and at bedtime. Align    [provider]  rosuvastatin  (CRESTOR ) 5 MG tablet Take 5 mg by mouth daily.    [provider]  Zinc 30 MG TABS Take 30 mg by mouth daily.    [provider]    Physical Exam    Vital Signs:  Oscar [name] does not have vital signs available for review today 114/72  HR 78  Given telephonic nature of communication, physical exam is limited. AAOx3. NAD. Normal affect.  Speech and respirations are unlabored.  Accessory Clinical Findings    None  Assessment & Plan    1.  Preoperative Cardiovascular Risk Assessment:  According to the Revised Cardiac Risk Index (RCRI), his Perioperative Risk of Major Cardiac Event is (%): 0.9  His Functional Capacity in METs is: 9.89 according to the [facility] Activity Status Index (DASI).   The patient was advised that if he develops new symptoms prior to surgery to contact our office to arrange for a follow-up visit, and he verbalized understanding.  Per office protocol, if patient is without any new symptoms or concerns at the time of their virtual visit, he may hold ASA for 7 days prior to procedure. Please resume ASA as soon as possible postprocedure, at the discretion of the surgeon.    Therefore, based on ACC/AHA  guidelines, patient would be at acceptable risk for the planned procedure without further cardiovascular testing. I will route this recommendation to the requesting party via Epic fax function.   A copy of this note will be routed to requesting surgeon.  Time:   Today, I have spent  15 minutes with the patient with telehealth technology discussing medical history, symptoms, and management plan.     Oscar Satterfield, NP  04/15/2023, 3:38 PM

## 2023-04-16 NOTE — Anesthesia Preprocedure Evaluation (Addendum)
Anesthesia Evaluation  Patient identified by MRN, date of birth, ID band Patient awake    Reviewed: Allergy & Precautions, H&P , NPO status , Patient's Chart, lab work & pertinent test results  Airway Mallampati: IV  TM Distance: >3 FB Neck ROM: Full    Dental  (+) Teeth Intact, Dental Advisory Given   Pulmonary asthma (just w/ cold temperature) , sleep apnea and Continuous Positive Airway Pressure Ventilation    Pulmonary exam normal breath sounds clear to auscultation       Cardiovascular METS: > 9 Mets hypertension (147/86 preop, normally 110s SBP), Pt. on medications + CAD, + Past MI and + CABG (2020)  Normal cardiovascular exam Rhythm:Regular Rate:Normal  CABG x 4 in 09/2018, most recent cardiac catheterization May 2022 revealed atretic RIMA to RCA with moderate RCA disease, however FFR and RFR of the RCA were negative so he is medically managed.   Cardiac Cath 08/25/2020 1.  Severe native coronary artery disease with patency of the left mainstem, severe stenosis of the LAD and first diagonal branches, severe stenosis of the distal left circumflex, and moderate diffuse stenosis of the mid RCA through the PDA branch 2.  Status post aortocoronary bypass surgery with continued patency of the LIMA to LAD, saphenous vein graft to first diagonal, and free left radial graft to the left posterolateral branch.  The RIMA to PDA is atretic. 3.  Negative RFR and FFR evaluation of the native right coronary artery, appropriate for medical therapy 4.  Normal LVEDP   Recommendations: There is no hemodynamically significant stenosis in the RCA with a pressure wire in the distal portion of the PDA not meeting criteria for hemodynamic significance.  Recommend medical therapy of coronary artery disease.   Echo 10/21/2018 (post-cabg): 1. The left ventricle has normal systolic function with an ejection  fraction of 60-65%. The cavity size was normal.  There is moderately  increased left ventricular wall thickness. Left ventricular diastolic  Doppler parameters are consistent with  pseudonormalization.   2. The right ventricle has normal systolic function. The cavity was  normal. There is no increase in right ventricular wall thickness.   3. The aortic valve is tricuspid. Moderate thickening of the aortic  valve. Moderate calcification of the aortic valve.   4. The aorta is normal in size and structure.     Neuro/Psych negative neurological ROS  negative psych ROS   GI/Hepatic Neg liver ROS, PUD,,,  Endo/Other  diabetes, Well Controlled, Type 2, Oral Hypoglycemic Agents  Obesity BMI 34  Renal/GU Renal diseaseCr 1.3  negative genitourinary   Musculoskeletal  (+) Arthritis , Osteoarthritis,    Abdominal  (+) + obese  Peds negative pediatric ROS (+)  Hematology negative hematology ROS (+) Hb 17.7, plt 228   Anesthesia Other Findings   Reproductive/Obstetrics negative OB ROS                             Anesthesia Physical Anesthesia Plan  ASA: 3  Anesthesia Plan: MAC and Spinal   Post-op Pain Management: Ofirmev IV (intra-op)*   Induction: Intravenous  PONV Risk Score and Plan: 2 and Ondansetron, Dexamethasone, Treatment may vary due to age or medical condition, Propofol infusion and TIVA  Airway Management Planned: Natural Airway and Simple Face Mask  Additional Equipment: None  Intra-op Plan:   Post-operative Plan:   Informed Consent: I have reviewed the patients History and Physical, chart, labs and discussed the procedure including  the risks, benefits and alternatives for the proposed anesthesia with the patient or authorized representative who has indicated his/her understanding and acceptance.       Plan Discussed with: CRNA  Anesthesia Plan Comments: ( Per pt not true allergy to tylenol)       Anesthesia Quick Evaluation

## 2023-04-18 ENCOUNTER — Ambulatory Visit (HOSPITAL_COMMUNITY): Payer: No Typology Code available for payment source | Admitting: Certified Registered"

## 2023-04-18 ENCOUNTER — Other Ambulatory Visit: Payer: Self-pay

## 2023-04-18 ENCOUNTER — Encounter (HOSPITAL_COMMUNITY)
Admission: RE | Disposition: A | Discharge status: Home / Self Care | Payer: Self-pay | Source: Ambulatory Visit | Attending: Orthopedic Surgery

## 2023-04-18 ENCOUNTER — Observation Stay (HOSPITAL_COMMUNITY): Payer: No Typology Code available for payment source

## 2023-04-18 ENCOUNTER — Encounter (HOSPITAL_COMMUNITY): Payer: Self-pay | Admitting: Orthopedic Surgery

## 2023-04-18 ENCOUNTER — Ambulatory Visit (HOSPITAL_COMMUNITY): Payer: No Typology Code available for payment source

## 2023-04-18 ENCOUNTER — Ambulatory Visit (HOSPITAL_COMMUNITY): Payer: No Typology Code available for payment source | Admitting: Physician Assistant

## 2023-04-18 ENCOUNTER — Observation Stay (HOSPITAL_COMMUNITY)
Admission: RE | Admit: 2023-04-18 | Discharge: 2023-04-19 | Disposition: A | Discharge status: Home / Self Care | Payer: No Typology Code available for payment source | Source: Ambulatory Visit | Attending: Orthopedic Surgery | Admitting: Orthopedic Surgery

## 2023-04-18 DIAGNOSIS — I10 Essential (primary) hypertension: Secondary | ICD-10-CM

## 2023-04-18 DIAGNOSIS — Z79899 Other long term (current) drug therapy: Secondary | ICD-10-CM | POA: Diagnosis not present

## 2023-04-18 DIAGNOSIS — I509 Heart failure, unspecified: Secondary | ICD-10-CM | POA: Diagnosis not present

## 2023-04-18 DIAGNOSIS — Z96642 Presence of left artificial hip joint: Secondary | ICD-10-CM

## 2023-04-18 DIAGNOSIS — Z91040 Latex allergy status: Secondary | ICD-10-CM | POA: Insufficient documentation

## 2023-04-18 DIAGNOSIS — Z951 Presence of aortocoronary bypass graft: Secondary | ICD-10-CM | POA: Diagnosis not present

## 2023-04-18 DIAGNOSIS — I2510 Atherosclerotic heart disease of native coronary artery without angina pectoris: Secondary | ICD-10-CM | POA: Diagnosis not present

## 2023-04-18 DIAGNOSIS — M1612 Unilateral primary osteoarthritis, left hip: Secondary | ICD-10-CM

## 2023-04-18 DIAGNOSIS — J45909 Unspecified asthma, uncomplicated: Secondary | ICD-10-CM | POA: Diagnosis not present

## 2023-04-18 DIAGNOSIS — Z955 Presence of coronary angioplasty implant and graft: Secondary | ICD-10-CM | POA: Insufficient documentation

## 2023-04-18 DIAGNOSIS — I110 Hypertensive heart disease with heart failure: Secondary | ICD-10-CM | POA: Diagnosis not present

## 2023-04-18 DIAGNOSIS — E119 Type 2 diabetes mellitus without complications: Secondary | ICD-10-CM | POA: Insufficient documentation

## 2023-04-18 DIAGNOSIS — Z7982 Long term (current) use of aspirin: Secondary | ICD-10-CM | POA: Diagnosis not present

## 2023-04-18 DIAGNOSIS — Z7984 Long term (current) use of oral hypoglycemic drugs: Secondary | ICD-10-CM | POA: Diagnosis not present

## 2023-04-18 HISTORY — PX: TOTAL HIP ARTHROPLASTY: SHX124

## 2023-04-18 LAB — GLUCOSE, CAPILLARY
Glucose-Capillary: 121 mg/dL — ABNORMAL HIGH (ref 70–99)
Glucose-Capillary: 111 mg/dL — ABNORMAL HIGH (ref 70–99)

## 2023-04-18 SURGERY — ARTHROPLASTY, HIP, TOTAL, ANTERIOR APPROACH
Anesthesia: Spinal | Site: Hip | Laterality: Left

## 2023-04-18 SURGERY — ARTHROPLASTY, HIP, TOTAL, ANTERIOR APPROACH
Anesthesia: Monitor Anesthesia Care | Site: Hip | Laterality: Left

## 2023-04-18 MED ORDER — KETOROLAC TROMETHAMINE 30 MG/ML IJ SOLN
INTRAMUSCULAR | Status: AC
  Filled 2023-04-18: qty 1

## 2023-04-18 MED ORDER — ASPIRIN 81 MG PO CHEW: 81.0000 mg | CHEWABLE_TABLET | Freq: Two times a day (BID) | ORAL | 0 refills | Status: AC

## 2023-04-18 MED ORDER — ROSUVASTATIN CALCIUM 5 MG PO TABS
5.0000 mg | ORAL_TABLET | Freq: Every day | ORAL | Status: DC
  Administered 2023-04-19: 5 mg via ORAL
  Filled 2023-04-18: qty 1

## 2023-04-18 MED ORDER — MIDAZOLAM HCL 2 MG/2ML IJ SOLN
INTRAMUSCULAR | Status: DC | PRN
  Administered 2023-04-18: 2 mg via INTRAVENOUS

## 2023-04-18 MED ORDER — DEXAMETHASONE SODIUM PHOSPHATE 10 MG/ML IJ SOLN
8.0000 mg | Freq: Once | INTRAMUSCULAR | Status: AC
  Administered 2023-04-18: 8 mg via INTRAVENOUS

## 2023-04-18 MED ORDER — DIPHENHYDRAMINE HCL 12.5 MG/5ML PO ELIX: 12.5000 mg | ORAL_SOLUTION | ORAL | Status: DC | PRN

## 2023-04-18 MED ORDER — PHENYLEPHRINE HCL (PRESSORS) 10 MG/ML IV SOLN
INTRAVENOUS | Status: AC
  Filled 2023-04-18: qty 1

## 2023-04-18 MED ORDER — POLYETHYLENE GLYCOL 3350 17 G PO PACK: 17.0000 g | PACK | Freq: Two times a day (BID) | ORAL | Status: DC

## 2023-04-18 MED ORDER — HYDROCHLOROTHIAZIDE 12.5 MG PO TABS
12.5000 mg | ORAL_TABLET | Freq: Every day | ORAL | Status: DC
  Administered 2023-04-19: 12.5 mg via ORAL
  Filled 2023-04-18: qty 1

## 2023-04-18 MED ORDER — HYDROMORPHONE HCL 1 MG/ML IJ SOLN: 0.2500 mg | INTRAMUSCULAR | Status: DC | PRN

## 2023-04-18 MED ORDER — SODIUM CHLORIDE 0.9% FLUSH: 10.0000 mL | Freq: Two times a day (BID) | INTRAVENOUS | Status: DC

## 2023-04-18 MED ORDER — INSULIN ASPART 100 UNIT/ML IJ SOLN: 0.0000 [IU] | INTRAMUSCULAR | Status: DC | PRN

## 2023-04-18 MED ORDER — SODIUM CHLORIDE 0.9 % IV SOLN
3.0000 g | Freq: Once | INTRAVENOUS | Status: AC
  Administered 2023-04-18: 3 g via INTRAVENOUS
  Filled 2023-04-18: qty 3

## 2023-04-18 MED ORDER — BISACODYL 10 MG RE SUPP
10.0000 mg | Freq: Every day | RECTAL | Status: DC | PRN
Start: 2023-04-18 — End: 2023-04-19

## 2023-04-18 MED ORDER — BUPIVACAINE IN DEXTROSE 0.75-8.25 % IT SOLN
INTRATHECAL | Status: DC | PRN
  Administered 2023-04-18: 2 mL via INTRATHECAL

## 2023-04-18 MED ORDER — AMISULPRIDE (ANTIEMETIC) 5 MG/2ML IV SOLN: 10.0000 mg | Freq: Once | INTRAVENOUS | Status: DC | PRN

## 2023-04-18 MED ORDER — CEFAZOLIN SODIUM-DEXTROSE 2-4 GM/100ML-% IV SOLN: 2.0000 g | INTRAVENOUS | Status: DC

## 2023-04-18 MED ORDER — ALUM & MAG HYDROXIDE-SIMETH 200-200-20 MG/5ML PO SUSP: 30.0000 mL | ORAL | Status: DC | PRN

## 2023-04-18 MED ORDER — OXYCODONE HCL 5 MG PO TABS
5.0000 mg | ORAL_TABLET | ORAL | Status: DC | PRN
  Administered 2023-04-18: 5 mg via ORAL
  Filled 2023-04-18: qty 1

## 2023-04-18 MED ORDER — SODIUM CHLORIDE (PF) 0.9 % IJ SOLN
INTRAMUSCULAR | Status: DC | PRN
  Administered 2023-04-18: 61 mL

## 2023-04-18 MED ORDER — POVIDONE-IODINE 10 % EX SWAB: 2.0000 | Freq: Once | CUTANEOUS | Status: DC

## 2023-04-18 MED ORDER — HYDROMORPHONE HCL 1 MG/ML IJ SOLN
0.5000 mg | INTRAMUSCULAR | Status: DC | PRN
  Administered 2023-04-19 (×2): 1 mg via INTRAVENOUS
  Filled 2023-04-18 (×2): qty 1

## 2023-04-18 MED ORDER — TRANEXAMIC ACID-NACL 1000-0.7 MG/100ML-% IV SOLN
1000.0000 mg | INTRAVENOUS | Status: AC
  Administered 2023-04-18: 1000 mg via INTRAVENOUS
  Filled 2023-04-18: qty 100

## 2023-04-18 MED ORDER — PHENYLEPHRINE HCL-NACL 20-0.9 MG/250ML-% IV SOLN
INTRAVENOUS | Status: DC | PRN
  Administered 2023-04-18: 40 ug/min via INTRAVENOUS

## 2023-04-18 MED ORDER — INSULIN ASPART 100 UNIT/ML IJ SOLN
0.0000 [IU] | Freq: Three times a day (TID) | INTRAMUSCULAR | Status: DC
  Administered 2023-04-19: 3 [IU] via SUBCUTANEOUS

## 2023-04-18 MED ORDER — KETAMINE HCL 10 MG/ML IJ SOLN
INTRAMUSCULAR | Status: DC | PRN
  Administered 2023-04-18: 25 mg via INTRAVENOUS

## 2023-04-18 MED ORDER — METOCLOPRAMIDE HCL 5 MG PO TABS: 5.0000 mg | ORAL_TABLET | Freq: Three times a day (TID) | ORAL | Status: DC | PRN

## 2023-04-18 MED ORDER — DEXAMETHASONE SODIUM PHOSPHATE 10 MG/ML IJ SOLN
10.0000 mg | Freq: Once | INTRAMUSCULAR | Status: AC
  Administered 2023-04-19: 10 mg via INTRAVENOUS
  Filled 2023-04-18: qty 1

## 2023-04-18 MED ORDER — OXYCODONE HCL 5 MG PO TABS
10.0000 mg | ORAL_TABLET | ORAL | Status: DC | PRN
  Administered 2023-04-19: 15 mg via ORAL
  Administered 2023-04-19: 10 mg via ORAL
  Filled 2023-04-18 (×2): qty 3

## 2023-04-18 MED ORDER — METHOCARBAMOL 1000 MG/10ML IJ SOLN: 500.0000 mg | Freq: Four times a day (QID) | INTRAMUSCULAR | Status: DC | PRN

## 2023-04-18 MED ORDER — METHOCARBAMOL 500 MG PO TABS: 500.0000 mg | ORAL_TABLET | Freq: Four times a day (QID) | ORAL | 1 refills | Status: DC | PRN

## 2023-04-18 MED ORDER — FENTANYL CITRATE (PF) 100 MCG/2ML IJ SOLN
INTRAMUSCULAR | Status: DC | PRN
  Administered 2023-04-18 (×2): 50 ug via INTRAVENOUS

## 2023-04-18 MED ORDER — GLYCOPYRROLATE PF 0.2 MG/ML IJ SOSY
PREFILLED_SYRINGE | INTRAMUSCULAR | Status: DC | PRN
  Administered 2023-04-18: .2 mg via INTRAVENOUS

## 2023-04-18 MED ORDER — LACTATED RINGERS IV SOLN: INTRAVENOUS | Status: DC

## 2023-04-18 MED ORDER — ONDANSETRON HCL 4 MG PO TABS
4.0000 mg | ORAL_TABLET | Freq: Four times a day (QID) | ORAL | Status: DC | PRN
Start: 2023-04-18 — End: 2023-04-19

## 2023-04-18 MED ORDER — METFORMIN HCL 500 MG PO TABS
1000.0000 mg | ORAL_TABLET | Freq: Two times a day (BID) | ORAL | Status: DC
  Administered 2023-04-19: 1000 mg via ORAL
  Filled 2023-04-18: qty 2

## 2023-04-18 MED ORDER — CEFAZOLIN SODIUM-DEXTROSE 2-4 GM/100ML-% IV SOLN
2.0000 g | Freq: Four times a day (QID) | INTRAVENOUS | Status: AC
  Administered 2023-04-18 – 2023-04-19 (×2): 2 g via INTRAVENOUS
  Filled 2023-04-18 (×2): qty 100

## 2023-04-18 MED ORDER — PROPOFOL 500 MG/50ML IV EMUL
INTRAVENOUS | Status: DC | PRN
  Administered 2023-04-18: 50 ug/kg/min via INTRAVENOUS

## 2023-04-18 MED ORDER — SENNA 8.6 MG PO TABS
2.0000 | ORAL_TABLET | Freq: Every day | ORAL | Status: DC
  Administered 2023-04-18: 17.2 mg via ORAL
  Filled 2023-04-18: qty 2

## 2023-04-18 MED ORDER — MENTHOL 3 MG MT LOZG: 1.0000 | LOZENGE | OROMUCOSAL | Status: DC | PRN

## 2023-04-18 MED ORDER — METOCLOPRAMIDE HCL 5 MG/ML IJ SOLN: 5.0000 mg | Freq: Three times a day (TID) | INTRAMUSCULAR | Status: DC | PRN

## 2023-04-18 MED ORDER — 0.9 % SODIUM CHLORIDE (POUR BTL) OPTIME
TOPICAL | Status: DC | PRN
  Administered 2023-04-18: 1000 mL

## 2023-04-18 MED ORDER — TRANEXAMIC ACID-NACL 1000-0.7 MG/100ML-% IV SOLN
1000.0000 mg | Freq: Once | INTRAVENOUS | Status: AC
  Administered 2023-04-18: 1000 mg via INTRAVENOUS
  Filled 2023-04-18: qty 100

## 2023-04-18 MED ORDER — CHLORHEXIDINE GLUCONATE 0.12 % MT SOLN: 15.0000 mL | Freq: Once | OROMUCOSAL | Status: DC

## 2023-04-18 MED ORDER — BUPIVACAINE-EPINEPHRINE 0.25% -1:200000 IJ SOLN
INTRAMUSCULAR | Status: AC
  Filled 2023-04-18: qty 1

## 2023-04-18 MED ORDER — PHENOL 1.4 % MT LIQD: 1.0000 | OROMUCOSAL | Status: DC | PRN

## 2023-04-18 MED ORDER — ONDANSETRON HCL 4 MG/2ML IJ SOLN: 4.0000 mg | Freq: Four times a day (QID) | INTRAMUSCULAR | Status: DC | PRN

## 2023-04-18 MED ORDER — SENNA 8.6 MG PO TABS: 1.0000 | ORAL_TABLET | Freq: Every day | ORAL | 0 refills | Status: AC

## 2023-04-18 MED ORDER — ORAL CARE MOUTH RINSE: 15.0000 mL | Freq: Once | OROMUCOSAL | Status: DC

## 2023-04-18 MED ORDER — ASPIRIN 81 MG PO CHEW
81.0000 mg | CHEWABLE_TABLET | Freq: Two times a day (BID) | ORAL | Status: DC
  Administered 2023-04-18 – 2023-04-19 (×2): 81 mg via ORAL
  Filled 2023-04-18 (×2): qty 1

## 2023-04-18 MED ORDER — OXYCODONE HCL 5 MG PO TABS: 5.0000 mg | ORAL_TABLET | Freq: Once | ORAL | Status: DC | PRN

## 2023-04-18 MED ORDER — PROPOFOL 10 MG/ML IV BOLUS
INTRAVENOUS | Status: DC | PRN
  Administered 2023-04-18 (×2): 20 mg via INTRAVENOUS
  Administered 2023-04-18: 30 mg via INTRAVENOUS
  Administered 2023-04-18: 20 mg via INTRAVENOUS

## 2023-04-18 MED ORDER — ONDANSETRON HCL 4 MG/2ML IJ SOLN: 4.0000 mg | Freq: Once | INTRAMUSCULAR | Status: DC | PRN

## 2023-04-18 MED ORDER — OXYCODONE HCL 5 MG/5ML PO SOLN: 5.0000 mg | Freq: Once | ORAL | Status: DC | PRN

## 2023-04-18 MED ORDER — FENTANYL CITRATE (PF) 100 MCG/2ML IJ SOLN
INTRAMUSCULAR | Status: AC
  Filled 2023-04-18: qty 2

## 2023-04-18 MED ORDER — SODIUM CHLORIDE 0.9% FLUSH: 3.0000 mL | INTRAVENOUS | Status: DC | PRN

## 2023-04-18 MED ORDER — AMLODIPINE BESYLATE 5 MG PO TABS
5.0000 mg | ORAL_TABLET | Freq: Every day | ORAL | Status: DC
  Administered 2023-04-19: 5 mg via ORAL
  Filled 2023-04-18: qty 1

## 2023-04-18 MED ORDER — SODIUM CHLORIDE 0.9% FLUSH
3.0000 mL | Freq: Two times a day (BID) | INTRAVENOUS | Status: DC
Start: 2023-04-18 — End: 2023-04-19

## 2023-04-18 MED ORDER — METHOCARBAMOL 500 MG PO TABS
500.0000 mg | ORAL_TABLET | Freq: Four times a day (QID) | ORAL | Status: DC | PRN
  Administered 2023-04-18 – 2023-04-19 (×2): 500 mg via ORAL
  Filled 2023-04-18 (×2): qty 1

## 2023-04-18 MED ORDER — OXYCODONE HCL 5 MG PO TABS: 5.0000 mg | ORAL_TABLET | ORAL | 0 refills | Status: DC | PRN

## 2023-04-18 MED ORDER — DEXMEDETOMIDINE HCL IN NACL 200 MCG/50ML IV SOLN
INTRAVENOUS | Status: DC | PRN
  Administered 2023-04-18: 4 ug via INTRAVENOUS
  Administered 2023-04-18: 12 ug via INTRAVENOUS

## 2023-04-18 MED ORDER — KETAMINE HCL 50 MG/5ML IJ SOSY
PREFILLED_SYRINGE | INTRAMUSCULAR | Status: AC
  Filled 2023-04-18: qty 5

## 2023-04-18 MED ORDER — MIDAZOLAM HCL 2 MG/2ML IJ SOLN
INTRAMUSCULAR | Status: AC
Start: 2023-04-18 — End: ?
  Filled 2023-04-18: qty 2

## 2023-04-18 SURGICAL SUPPLY — 39 items
BAG COUNTER SPONGE SURGICOUNT (BAG) IMPLANT
BAG ZIPLOCK 12X15 (MISCELLANEOUS) IMPLANT
BLADE SAG 18X100X1.27 (BLADE) ×1 IMPLANT
COVER PERINEAL POST (MISCELLANEOUS) ×1 IMPLANT
COVER SURGICAL LIGHT HANDLE (MISCELLANEOUS) ×1 IMPLANT
CUP ACET PINNACLE SECTR 56MM (Hips) IMPLANT
DERMABOND ADVANCED .7 DNX12 (GAUZE/BANDAGES/DRESSINGS) ×1 IMPLANT
DRAPE FOOT SWITCH (DRAPES) ×1 IMPLANT
DRAPE STERI IOBAN 125X83 (DRAPES) ×1 IMPLANT
DRAPE U-SHAPE 47X51 STRL (DRAPES) ×2 IMPLANT
DRESSING AQUACEL AG SP 3.5X10 (GAUZE/BANDAGES/DRESSINGS) ×1 IMPLANT
DRSG AQUACEL AG SP 3.5X10 (GAUZE/BANDAGES/DRESSINGS) ×1 IMPLANT
DURAPREP 26ML APPLICATOR (WOUND CARE) ×1 IMPLANT
ELECT REM PT RETURN 15FT ADLT (MISCELLANEOUS) ×1 IMPLANT
GLOVE BIO SURGEON STRL SZ 6 (GLOVE) ×1 IMPLANT
GLOVE BIOGEL PI IND STRL 6.5 (GLOVE) ×1 IMPLANT
GLOVE BIOGEL PI IND STRL 7.5 (GLOVE) ×1 IMPLANT
GLOVE ORTHO TXT STRL SZ7.5 (GLOVE) ×2 IMPLANT
GOWN STRL REUS W/ TWL LRG LVL3 (GOWN DISPOSABLE) ×2 IMPLANT
HEAD CERAMIC 36 PLUS5 (Hips) IMPLANT
HOLDER FOLEY CATH W/STRAP (MISCELLANEOUS) ×1 IMPLANT
KIT TURNOVER KIT A (KITS) IMPLANT
MANIFOLD NEPTUNE II (INSTRUMENTS) ×1 IMPLANT
NDL SAFETY ECLIPSE 18X1.5 (NEEDLE) IMPLANT
PACK ANTERIOR HIP CUSTOM (KITS) ×1 IMPLANT
PINNACLE ALTRX PLUS 4 N 36X56 (Hips) IMPLANT
PINNACLE SECTOR CUP 56MM (Hips) ×1 IMPLANT
SCREW 6.5MMX30MM (Screw) IMPLANT
STEM FEM ACTIS HIGH SZ7 (Stem) IMPLANT
SUT MNCRL AB 4-0 PS2 18 (SUTURE) ×1 IMPLANT
SUT STRATAFIX 0 PDS 27 VIOLET (SUTURE) ×1 IMPLANT
SUT VIC AB 1 CT1 36 (SUTURE) ×3 IMPLANT
SUT VIC AB 2-0 CT1 TAPERPNT 27 (SUTURE) ×2 IMPLANT
SUTURE STRATFX 0 PDS 27 VIOLET (SUTURE) ×1 IMPLANT
SYR 3ML LL SCALE MARK (SYRINGE) IMPLANT
TOWEL GREEN STERILE FF (TOWEL DISPOSABLE) ×1 IMPLANT
TRAY FOLEY MTR SLVR 16FR STAT (SET/KITS/TRAYS/PACK) ×1 IMPLANT
TUBE SUCTION HIGH CAP CLEAR NV (SUCTIONS) ×1 IMPLANT
WATER STERILE IRR 1000ML POUR (IV SOLUTION) ×1 IMPLANT

## 2023-04-18 NOTE — Interval H&P Note (Signed)
History and Physical Interval Note:  04/18/2023 1:01 PM  Oscar [name]  has presented today for surgery, with the diagnosis of Left Hip osteoarthritis.  The various methods of treatment have been discussed with the patient and family. After consideration of risks, benefits and other options for treatment, the patient has consented to  Procedure(s): TOTAL HIP ARTHROPLASTY ANTERIOR APPROACH (Left) as a surgical intervention.  The patient's history has been reviewed, patient examined, no change in status, stable for surgery.  I have reviewed the patient's chart and labs.  Questions were answered to the patient's satisfaction.     Shelda Pal

## 2023-04-18 NOTE — Progress Notes (Addendum)
PT Note  Patient Details Name: Oscar [name] MRN: [id number] DOB: 02/23/1961   Cancelled Treatment:    Reason Eval/Treat Not Completed: Other (comment). PT arrived at 64. Pt exhibits poor motor control and coordination with reports of abn sensation B LE and buttock/groin related to regression of spinal. Pt reports he is just starting to be aware of surgical incision from L THA, AA.  Pt is not currently ready for PT evaluation. PT to return if schedule allows. PT to continue to follow acutely.   PT returned 1834 and pt reports improved movement in R LE however L minimal motor control and coordination with ongoing abn sensation. Pt is not appropriate at this time for PT evaluation. Pt and spouse aware of PT evaluation on 04/19/2023.  Oscar [name], PT Acute Rehab   Jacqualyn Posey 04/18/2023, 5:44 PM

## 2023-04-18 NOTE — Discharge Instructions (Signed)

## 2023-04-18 NOTE — Op Note (Signed)
NAME:  Oscar [name]                ACCOUNT NO.: [id number]      MEDICAL RECORD NO.: [id number]      FACILITY:  [facility]      PHYSICIAN:  Shelda Pal  DATE OF BIRTH:  [birth year]     DATE OF PROCEDURE:  04/18/2023                                 OPERATIVE REPORT         PREOPERATIVE DIAGNOSIS: Left  hip osteoarthritis.      POSTOPERATIVE DIAGNOSIS:  Left hip osteoarthritis.      PROCEDURE:  Left total hip replacement through an anterior approach   utilizing DePuy THR system, component size 56 mm pinnacle cup, a size 36+4 neutral   Altrex liner, a size 7 Hi Actis stem with a 36+5 delta ceramic   ball.      SURGEON:  Madlyn Frankel. Charlann Boxer, M.D.      ASSISTANT:  Rosalene Billings, PA-C     ANESTHESIA:  Spinal.      SPECIMENS:  None.      COMPLICATIONS:  None.      BLOOD LOSS:  350 cc     DRAINS:  None.      INDICATION OF THE PROCEDURE:  Oscar [name] is a 63 y.o. male who had   presented to office for evaluation of left hip pain.  Radiographs revealed   progressive degenerative changes with bone-on-bone   articulation of the  hip joint, including subchondral cystic changes and osteophytes.  The patient had painful limited range of   motion significantly affecting their overall quality of life and function.  The patient was failing to    respond to conservative measures including medications and/or injections and activity modification and at this point was ready   to proceed with more definitive measures.  Consent was obtained for   benefit of pain relief.  Specific risks of infection, DVT, component   failure, dislocation, neurovascular injury, and need for revision surgery were reviewed in the office.     PROCEDURE IN DETAIL:  The patient was brought to operative theater.   Once adequate anesthesia, preoperative antibiotics, 2 gm of Ancef, 1 gm of Tranexamic Acid, and 10 mg of Decadron were administered, the patient was positioned supine on the [employer]  table.  Once the patient was safely positioned with adequate padding of boney prominences we predraped out the hip, and used fluoroscopy to confirm orientation of the pelvis.      The left hip was then prepped and draped from proximal iliac crest to   mid thigh with a shower curtain technique.      Time-out was performed identifying the patient, planned procedure, and the appropriate extremity.     An incision was then made 2 cm lateral to the   anterior superior iliac spine extending over the orientation of the   tensor fascia lata muscle and sharp dissection was carried down to the   fascia of the muscle.      The fascia was then incised.  The muscle belly was identified and swept   laterally and retractor placed along the superior neck.  Following   cauterization of the circumflex vessels and removing some pericapsular   fat, a second cobra retractor was placed on the inferior neck.  A T-capsulotomy was made along the line of the   superior neck to the trochanteric fossa, then extended proximally and   distally.  Tag sutures were placed and the retractors were then placed   intracapsular.  We then identified the trochanteric fossa and   orientation of my neck cut and then made a neck osteotomy with the femur on traction.  The femoral   head was removed without difficulty or complication.  Traction was let   off and retractors were placed posterior and anterior around the   acetabulum.      The labrum and foveal tissue were debrided.  I began reaming with a 50 mm   reamer and reamed up to 55 mm reamer with good bony bed preparation and a 56 mm  cup was chosen.  The final 56 mm Pinnacle cup was then impacted under fluoroscopy to confirm the depth of penetration and orientation with respect to   Abduction and forward flexion.  A screw was placed into the ilium followed by the hole eliminator.  The final   36+4 neutral Altrex liner was impacted with good visualized rim fit.  The cup was  positioned anatomically within the acetabular portion of the pelvis.      At this point, the femur was rolled to 100 degrees.  Further capsule was   released off the inferior aspect of the femoral neck.  I then   released the superior capsule proximally.  With the leg in a neutral position the hook was placed laterally   along the femur under the vastus lateralis origin and elevated manually and then held in position using the hook attachment on the bed.  The leg was then extended and adducted with the leg rolled to 100   degrees of external rotation.  Retractors were placed along the medial calcar and posteriorly over the greater trochanter.  Once the proximal femur was fully   exposed, I used a box osteotome to set orientation.  I then began   broaching with the starting chili pepper broach and passed this by hand and then broached up to 7.  With the 7 broach in place I chose a high offset neck and did several trial reductions.  The offset was appropriate, leg lengths   appeared to be equal best matched with the +5 head ball trial confirmed radiographically.   Given these findings, I went ahead and dislocated the hip, repositioned all   retractors and positioned the right hip in the extended and abducted position.  The final 7 Hi Actis stem was   chosen and it was impacted down to the level of neck cut.  Based on this   and the trial reductions, a final 36+5 delta ceramic ball was chosen and   impacted onto a clean and dry trunnion, and the hip was reduced.  The   hip had been irrigated throughout the case again at this point.  I did   reapproximate the superior capsular leaflet to the anterior leaflet   using #1 Vicryl.  The fascia of the   tensor fascia lata muscle was then reapproximated using #1 Vicryl and #0 Stratafix sutures.  The   remaining wound was closed with 2-0 Vicryl and running 4-0 Monocryl.   The hip was cleaned, dried, and dressed sterilely using Dermabond and   Aquacel  dressing.  The patient was then brought   to recovery room in stable condition tolerating the procedure well.    Rosalene Billings, PA-C  was present for the entirety of the case involved from   preoperative positioning, perioperative retractor management, general   facilitation of the case, as well as primary wound closure as assistant.            Madlyn Frankel Charlann Boxer, M.D.        04/18/2023 2:43 PM

## 2023-04-18 NOTE — Progress Notes (Signed)
   04/18/23 1945  BiPAP/CPAP/SIPAP  BiPAP/CPAP/SIPAP Pt Type Adult (pt prefer self placment no help needed.)  Patient Home Equipment Yes  Safety Check Completed by RT for Home Unit Yes, no issues noted

## 2023-04-18 NOTE — Anesthesia Procedure Notes (Signed)
Spinal  Patient location during procedure: OR Start time: 04/18/2023 1:12 PM End time: 04/18/2023 1:24 PM Reason for block: surgical anesthesia Staffing Performed: anesthesiologist  Anesthesiologist: Lannie Fields, DO Performed by: Lannie Fields, DO Authorized by: Lannie Fields, DO   Preanesthetic Checklist Completed: patient identified, IV checked, risks and benefits discussed, surgical consent, monitors and equipment checked, pre-op evaluation and timeout performed Spinal Block Patient position: sitting Prep: DuraPrep and site prepped and draped Patient monitoring: cardiac monitor, continuous pulse ox and blood pressure Approach: midline Location: L3-4 Injection technique: single-shot Needle Needle type: Pencan  Needle gauge: 24 G Needle length: 9 cm Assessment Sensory level: T6 Events: CSF return and second provider Additional Notes Functioning IV was confirmed and monitors were applied. Sterile prep and drape, including hand hygiene and sterile gloves were used. The patient was positioned and the spine was prepped. The skin was anesthetized with lidocaine.  Free flow of clear CSF was obtained prior to injecting local anesthetic into the CSF.  The spinal needle aspirated freely following injection.  The needle was carefully withdrawn.  The patient tolerated the procedure well.

## 2023-04-18 NOTE — H&P (Signed)
TOTAL HIP ADMISSION H&P  Patient is admitted for left total hip arthroplasty.  Therapy Plans: HEP Disposition: Home with wife Planned DVT Prophylaxis: aspirin 81mg  BID DME needed: walker PCP: Cardiologist: Dr. Shirlee Latch - clearance received TXA: IV Allergies: NKDA Anesthesia Concerns: none BMI: 35.7 Last HgbA1c: 6.7%   Other: - oxycodone, robaxin, tylenol - No hx of VTE or cancer - hx of ulcer related to a procedure - NO NSAIDs  Subjective:  Chief Complaint: left hip pain  HPI: Oscar [name], 62 y.o. male, has a history of pain and functional disability in the left hip(s) due to arthritis and patient has failed non-surgical conservative treatments for greater than 12 weeks to include NSAID's and/or analgesics and activity modification.  Onset of symptoms was gradual starting 2 years ago with gradually worsening course since that time.The patient noted no past surgery on the left hip(s).  Patient currently rates pain in the left hip at 8 out of 10 with activity. Patient has worsening of pain with activity and weight bearing, pain that interfers with activities of daily living, and pain with passive range of motion. Patient has evidence of joint space narrowing by imaging studies. This condition presents safety issues increasing the risk of falls. There is no current active infection.  Patient Active Problem List   Diagnosis Date Noted   Orthopnea 12/17/2018   History of ST elevation myocardial infarction (STEMI) 12/17/2018   Erythrocytosis 11/25/2018   Schatzki's ring of distal esophagus    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 10/26/2018   Acute ST elevation myocardial infarction (STEMI) of inferolateral wall (HCC) - Aborted 10/21/2018   Diabetes mellitus type 2 with complications (HCC) 10/21/2018   CAD, multiple vessel 10/21/2018   Pain in joint of left elbow 04/30/2018   Pain of left calf 07/08/2017   Prolapsed internal hemorrhoids, grade 2 12/05/2016    Coronary artery disease involving native coronary artery with angina pectoris (HCC) 01/11/2016   Family history of premature CAD 01/11/2016   Ventral hernia 10/25/2014   Diverticular hemorrhage - recurrent 12/26/2012   Obesity (BMI 30-39.9) 04/17/2012   Incisional hernia, recurrent 02/24/2012   Rectus diastasis 02/24/2012   BACK PAIN, LUMBAR 11/16/2009   Hyperlipidemia associated with type 2 diabetes mellitus (HCC) 05/24/2008   Essential hypertension 05/24/2008   Asthma 05/24/2008   Sleep apnea 05/24/2008   NEPHROLITHIASIS, HX OF 05/24/2008   Past Medical History:  Diagnosis Date   Acute duodenal ulcer with hemorrhage    Adenomatous polyp    Allergy    Arthritis    Asthma    CHF (congestive heart failure) (HCC)    Chickenpox    Diverticular hemorrhage - recurrent 12/26/2012   Diverticulitis    Erythrocytosis 11/25/2018   Fracture of left distal radius 04/22/2013   Hepatitis A    as a child, no current liver problems   History of kidney stones    Hyperlipidemia    Hypertension    he also sees Dr. Gwenette Greet in [place], [place]    Lower GI bleed    Hx: of   Myocardial infarction (HCC)    Obesity    Pneumonia    Prediabetes    tx metformin   Schatzki's ring of distal esophagus    Sleep apnea 2003   uses CPAP machine, pt does not know settings    Past Surgical History:  Procedure Laterality Date   APPENDECTOMY   10-24yrs ago   BIOPSY  11/10/2018   Procedure: BIOPSY;  Surgeon:  Cirigliano, Verlin Dike, DO;  Location: [facility];  Service: Gastroenterology;;   CARPAL TUNNEL RELEASE Left    CLOSED REDUCTION FINGER WITH PERCUTANEOUS PINNING Left 04/22/2013   Procedure: LEFT WRIST CLOSED REDUCTION CONVERTED TO OPEN REDUCTION INTERNAL FIXATION, LEFT CARPAL TUNNEL RELEASE;  Surgeon: Sharma Covert, MD;  Location: [facility] OR;  Service: Orthopedics;  Laterality: Left;   COLONOSCOPY  07/09/2009   benign polyp repeat 5 years, Dr.Sam Ganem   COLONOSCOPY  05/20/2016   CORONARY ARTERY BYPASS  GRAFT N/A 10/22/2018   Procedure: CORONARY ARTERY BYPASS GRAFTING (CABG) times four using right and left internal mammary arteries, left radial artery, and right greater saphenous vein harvested endoscopically.;  Surgeon: Linden Dolin, MD;  Location: [facility] OR;  Service: Open Heart Surgery;  Laterality: N/A;   CORONARY ARTERY BYPASS GRAFT     CORONARY PRESSURE/FFR STUDY N/A 08/25/2020   Procedure: INTRAVASCULAR PRESSURE WIRE/FFR STUDY;  Surgeon: Tonny Bollman, MD;  Location: [facility] INVASIVE CV LAB;  Service: Cardiovascular;  Laterality: N/A;   CORONARY STENT INTERVENTION N/A 08/25/2020   Procedure: CORONARY STENT INTERVENTION;  Surgeon: Tonny Bollman, MD;  Location: [facility] INVASIVE CV LAB;  Service: Cardiovascular;  Laterality: N/A;   CORONARY/GRAFT ACUTE MI REVASCULARIZATION N/A 10/21/2018   Procedure: Coronary/Graft Acute MI Revascularization;  Surgeon: Lyn Records, MD;  Location: [facility] IN[facility] CV LAB;  Service: Cardiovascular;  Laterality: N/A;   ESOPHAGOGASTRODUODENOSCOPY (EGD) WITH PROPOFOL N/A 11/10/2018   Procedure: ESOPHAGOGASTRODUODENOSCOPY (EGD) WITH PROPOFOL;  Surgeon: Shellia Cleverly, DO;  Location: [facility];  Service: Gastroenterology;  Laterality: N/A;   FRACTURE SURGERY Left    HERNIA REPAIR     repaired x 4   INSERTION OF MESH  04/09/2012   Procedure: INSERTION OF MESH;  Surgeon: Velora Heckler, MD;  Location: [facility];  Service: General;  Laterality: N/A;   LEFT HEART CATH AND CORONARY ANGIOGRAPHY N/A 10/21/2018   Procedure: LEFT HEART CATH AND CORONARY ANGIOGRAPHY;  Surgeon: Lyn Records, MD;  Location: [facility] CV [facility];  Service: Cardiovascular;  Laterality: N/A;   LEFT HEART CATH AND CORS/GRAFTS ANGIOGRAPHY N/A 08/25/2020   Procedure: LEFT HEART CATH AND CORS/GRAFTS ANGIOGRAPHY;  Surgeon: Tonny Bollman, MD;  Location: [facility] INVASIVE CV LAB;  Service: Cardiovascular;  Laterality: N/A;   RADIAL ARTERY HARVEST Left 10/22/2018   Procedure: LEFT RADIAL ARTERY HARVEST;   Surgeon: Linden Dolin, MD;  Location: [facility] OR;  Service: Open Heart Surgery;  Laterality: Left;   TEE WITHOUT CARDIOVERSION N/A 10/22/2018   Procedure: TRANSESOPHAGEAL ECHOCARDIOGRAM (TEE);  Surgeon: Linden Dolin, MD;  Location: [facility] OR;  Service: Open Heart Surgery;  Laterality: N/A;   TONSILLECTOMY  as child   UVULECTOMY  yrs ago   for snoring   VENTRAL HERNIA REPAIR  04/09/2012   Procedure: LAPAROSCOPIC VENTRAL HERNIA;  Surgeon: Velora Heckler, MD;  Location: [facility] ORS;  Service: General;  Laterality: N/A;  Laparoscopic Ventral Incisional Hernia Repair with Mesh    No current facility-administered medications for this encounter.   Current Outpatient Medications  Medication Sig Dispense Refill Last Dose/Taking   amLODipine (NORVASC) 5 MG tablet Take 1 tablet (5 mg total) by mouth at bedtime. (Patient taking differently: Take 5 mg by mouth daily.) 90 tablet 3 Taking Differently   Ascorbic Acid (VITAMIN C CR) 1000 MG TBCR Take 1,000 mg by mouth daily.   Taking   aspirin EC 81 MG tablet Take 1 tablet (81 mg total) by mouth daily.   Taking   cetirizine (ZYRTEC) 10 MG  tablet Take 10 mg by mouth daily as needed for allergies.   Taking As Needed   Cholecalciferol (DIALYVITE VITAMIN D 5000 PO) Take 10,000 Units by mouth.   Taking   Coenzyme Q10 (CO Q 10 PO) Take 1 tablet by mouth daily in the afternoon.   Taking   Cyanocobalamin (VITAMIN B 12 PO) Take 1 tablet by mouth daily.   Taking   Evolocumab (REPATHA SURECLICK) 140 MG/ML SOAJ ADMINISTER 1 ML UNDER THE SKIN EVERY 14 DAYS 6 mL 3 Taking   GARLIC PO Take 1 capsule by mouth daily.   Taking   hydrochlorothiazide (MICROZIDE) 12.5 MG capsule Take 12.5 mg by mouth daily.   Taking   LYSINE PO Take 1 capsule by mouth daily.   Taking   Magnesium Oxide 420 MG TABS Take 840 mg by mouth daily.   Taking   metFORMIN (GLUCOPHAGE) 1000 MG tablet Take 1,000 mg by mouth 2 (two) times daily.   Taking   OVER THE COUNTER MEDICATION Take 1 tablet by mouth daily.  Primal defense probiotic tablet   Taking   OVER THE COUNTER MEDICATION Take 1 tablet by mouth daily. Liver MD   Taking   OVER THE COUNTER MEDICATION Take 5 mLs by mouth daily. My vital C   Taking   Prasterone, DHEA, (DHEA PO) Take 35 mg by mouth daily.   Taking   Probiotic Product (PROBIOTIC PO) Take 1 tablet by mouth in the morning and at bedtime. Align   Taking   rosuvastatin (CRESTOR) 5 MG tablet Take 5 mg by mouth daily.   Taking   Zinc 30 MG TABS Take 30 mg by mouth daily.   Taking   Allergies  Allergen Reactions   Acetaminophen Other (See Comments)    Bleeding issues   Ibuprofen Other (See Comments)    Bleeding issues   Advair Diskus [Fluticasone-Salmeterol] Cough   Latex Itching    Social History   Tobacco Use   Smoking status: Never   Smokeless tobacco: Never  Substance Use Topics   Alcohol use: Yes    Alcohol/week: 2.0 - 3.0 standard drinks of alcohol    Types: 2 - 3 Glasses of wine per week    Comment: 2-3 glasses 2-3 times a week-wine    Family History  Problem Relation Age of Onset   Stroke Mother        late 80s   CAD Mother        MI in her 28s   CAD Father 33   Marfan syndrome Brother    Diabetes Brother    Hypertension Brother    Obesity Brother    Stroke Paternal Grandfather    Colon cancer Neg Hx    Rectal cancer Neg Hx    Stomach cancer Neg Hx      Review of Systems  Constitutional:  Negative for chills and fever.  Respiratory:  Negative for cough and shortness of breath.   Cardiovascular:  Negative for chest pain.  Gastrointestinal:  Negative for nausea and vomiting.  Musculoskeletal:  Positive for arthralgias.     Objective:  Physical Exam Well nourished and well developed. General: Alert and oriented x3, cooperative and pleasant, no acute distress. Head: normocephalic, atraumatic, neck supple. Eyes: EOMI.  Musculoskeletal: Left hip exam: He does have obvious tightness and reproducible discomfort over the anterior aspect the hip  with hip flexion internal rotation close to 5 degrees with pelvic tilting, external rotation over 20 degrees Slight external rotation contracture with active  hip flexion with 5/5 strength Neurovascular intact distally  Right hip exam: He does have some tightness with range of motion assessment without significant groin pain.  Calves soft and nontender. Motor function intact in LE. Strength 5/5 LE bilaterally. Neuro: Distal pulses 2+. Sensation to light touch intact in LE.  Vital signs in last 24 hours:    Labs:   Estimated body mass index is 34.15 kg/m as calculated from the following:   Height as of 04/07/23: 6\' 2"  (1.88 m).   Weight as of 04/07/23: 120.7 kg.   Imaging Review Plain radiographs demonstrate severe degenerative joint disease of the left hip(s). The bone quality appears to be adequate for age and reported activity level.      Assessment/Plan:  End stage arthritis, left hip(s)  The patient history, physical examination, clinical judgement of the provider and imaging studies are consistent with end stage degenerative joint disease of the left hip(s) and total hip arthroplasty is deemed medically necessary. The treatment options including medical management, injection therapy, arthroscopy and arthroplasty were discussed at length. The risks and benefits of total hip arthroplasty were presented and reviewed. The risks due to aseptic loosening, infection, stiffness, dislocation/subluxation,  thromboembolic complications and other imponderables were discussed.  The patient acknowledged the explanation, agreed to proceed with the plan and consent was signed. Patient is being admitted for inpatient treatment for surgery, pain control, PT, OT, prophylactic antibiotics, VTE prophylaxis, progressive ambulation and ADL's and discharge planning.The patient is planning to be discharged  home.    Patient's anticipated LOS is less than 2 midnights, meeting these requirements: -  Younger than 37 - Lives within 1 hour of care - Has a competent adult at home to recover with post-op recover - NO history of  - Chronic pain requiring opiods  - Diabetes  - Coronary Artery Disease  - Heart failure  - Heart attack  - Stroke  - DVT/VTE  - Cardiac arrhythmia  - Respiratory Failure/COPD  - Renal failure  - Anemia  - Advanced Liver disease  Rosalene Billings, PA-C Orthopedic Surgery [facility] [place] [phone]

## 2023-04-18 NOTE — Transfer of Care (Signed)
Immediate Anesthesia Transfer of Care Note  Patient: Oscar [name]  Procedure(s) Performed: TOTAL HIP ARTHROPLASTY ANTERIOR APPROACH (Left: Hip)  Patient Location: PACU  Anesthesia Type:Spinal and MAC combined with regional for post-op pain  Level of Consciousness: awake, alert , oriented, and patient cooperative  Airway & Oxygen Therapy: Patient Spontanous Breathing and Patient connected to face mask oxygen  Post-op Assessment: Report given to RN and Post -op Vital signs reviewed and stable  Post vital signs: Reviewed and stable  Last Vitals:  Vitals Value Taken Time  BP 125/82 04/18/23 1502  Temp    Pulse 74 04/18/23 1504  Resp 12 04/18/23 1504  SpO2 96 % 04/18/23 1504  Vitals shown include unfiled device data.  Last Pain:  Vitals:   04/18/23 1212  TempSrc: Oral  PainSc:       Patients Stated Pain Goal: 5 (04/18/23 1130)  Complications: No notable events documented.

## 2023-04-19 DIAGNOSIS — M1612 Unilateral primary osteoarthritis, left hip: Secondary | ICD-10-CM | POA: Diagnosis not present

## 2023-04-19 LAB — BASIC METABOLIC PANEL
Anion gap: 7 (ref 5–15)
BUN: 24 mg/dL — ABNORMAL HIGH (ref 8–23)
CO2: 26 mmol/L (ref 22–32)
Calcium: 8.8 mg/dL — ABNORMAL LOW (ref 8.9–10.3)
Chloride: 101 mmol/L (ref 98–111)
Creatinine, Ser: 1.43 mg/dL — ABNORMAL HIGH (ref 0.61–1.24)
GFR, Estimated: 55 mL/min — ABNORMAL LOW (ref >60)
Glucose, Bld: 155 mg/dL — ABNORMAL HIGH (ref 70–99)
Potassium: 4.5 mmol/L (ref 3.5–5.1)
Sodium: 134 mmol/L — ABNORMAL LOW (ref 135–145)

## 2023-04-19 LAB — CBC
HCT: 48.9 % (ref 39.0–52.0)
Hemoglobin: 16 g/dL (ref 13.0–17.0)
MCH: 30.5 pg (ref 26.0–34.0)
MCHC: 32.7 g/dL (ref 30.0–36.0)
MCV: 93.1 fL (ref 80.0–100.0)
Platelets: 236 10*3/uL (ref 150–400)
RBC: 5.25 MIL/uL (ref 4.22–5.81)
RDW: 13.4 % (ref 11.5–15.5)
WBC: 19.3 10*3/uL — ABNORMAL HIGH (ref 4.0–10.5)
nRBC: 0 % (ref 0.0–0.2)

## 2023-04-19 LAB — GLUCOSE, CAPILLARY: Glucose-Capillary: 187 mg/dL — ABNORMAL HIGH (ref 70–99)

## 2023-04-19 NOTE — Plan of Care (Signed)
  Problem: Coping: Goal: Level of anxiety will decrease Outcome: Progressing   Problem: Pain Managment: Goal: General experience of comfort will improve and/or be controlled Outcome: Progressing   Problem: Safety: Goal: Ability to remain free from injury will improve Outcome: Progressing

## 2023-04-19 NOTE — Anesthesia Postprocedure Evaluation (Signed)
Anesthesia Post Note  Patient: Oscar [name]  Procedure(s) Performed: TOTAL HIP ARTHROPLASTY ANTERIOR APPROACH (Left: Hip)     Patient location during evaluation: PACU Anesthesia Type: Spinal Level of consciousness: awake Pain management: pain level controlled Vital Signs Assessment: post-procedure vital signs reviewed and stable Respiratory status: spontaneous breathing, nonlabored ventilation and respiratory function stable Cardiovascular status: blood pressure returned to baseline and stable Postop Assessment: no apparent nausea or vomiting Anesthetic complications: no   No notable events documented.  Last Vitals:  Vitals:   04/19/23 0045 04/19/23 0400  BP: 134/82 122/81  Pulse: 85 81  Resp: 16 16  Temp: 36.5 C (!) 36.4 C  SpO2: 96% 96%    Last Pain:  Vitals:   04/19/23 0400  TempSrc: Oral  PainSc:                  Lecil Tapp P Humbert Morozov

## 2023-04-19 NOTE — Progress Notes (Signed)
Patient ID: Oscar [name], male   DOB: 09-Jan-1961, 63 y.o.   MRN: [id number] Subjective: 1 Day Post-Op Procedure(s) (LRB): TOTAL HIP ARTHROPLASTY ANTERIOR APPROACH (Left)    Patient reports pain as moderate but well controlled. No adverse events overnight Ready to get up and gt going, motivated Not out of bed yet  Objective:   VITALS:   Vitals:   04/19/23 0045 04/19/23 0400  BP: 134/82 122/81  Pulse: 85 81  Resp: 16 16  Temp: 97.7 F (36.5 C) (!) 97.5 F (36.4 C)  SpO2: 96% 96%    Neurovascular intact Incision: dressing C/D/I  LABS Recent Labs    04/19/23 0347  HGB 16.0  HCT 48.9  WBC 19.3*  PLT 236    Recent Labs    04/19/23 0347  NA 134*  K 4.5  BUN 24*  CREATININE 1.43*  GLUCOSE 155*    No results for input(s): "LABPT", "INR" in the last 72 hours.   Assessment/Plan: 1 Day Post-Op Procedure(s) (LRB): TOTAL HIP ARTHROPLASTY ANTERIOR APPROACH (Left)   Advance diet Up with therapy Reviewed goals and expectations Home today after therapy RTC in 2 weeks

## 2023-04-19 NOTE — Evaluation (Signed)
Physical Therapy Evaluation Patient Details Name: Oscar [name] MRN: [id number] DOB: 08-24-1960 Today's Date: 04/19/2023  History of Present Illness  63 yo male presents to therapy s/p L THA on 04/18/23. PMH: STEMI, DM II, CAD s/p CABG and stent placement, LBP, HTN, OSA on CPAP and hernia repair.  Clinical Impression  Patient evaluated by Physical Therapy with no further acute PT needs identified. All education has been completed and the patient has no further questions.  Pt doing quite well today, pain controlled with mobility. Reviewed areas below, progress of HEP/activity at home and cautioned to not incr activity too rapidly. Pt verbalizes Dr Charlann Boxer advised the same. Reviewed no LB wt training for 6wks as per Dr. Charlann Boxer. Pt is ready to d/c with family assist as needed  See below for any follow-up Physical Therapy or equipment needs. PT is signing off. Thank you for this referral.         If plan is discharge home, recommend the following:     Can travel by private vehicle        Equipment Recommendations Rolling walker (2 wheels)  Recommendations for Other Services       Functional Status Assessment Patient has had a recent decline in their functional status and demonstrates the ability to make significant improvements in function in a reasonable and predictable amount of time.     Precautions / Restrictions Precautions Precautions: Fall Restrictions Weight Bearing Restrictions Per Provider Order: No LLE Weight Bearing Per Provider Order: Weight bearing as tolerated      Mobility  Bed Mobility Overal bed mobility: Needs Assistance Bed Mobility: Supine to Sit     Supine to sit: HOB elevated, Used rails          Transfers Overall transfer level: Needs assistance Equipment used: Rolling walker (2 wheels) Transfers: Sit to/from Stand Sit to Stand: Supervision           General transfer comment: from bed, chair, cues for hand placement; practoced without use of  UEs to simulate home    Ambulation/Gait Ambulation/Gait assistance: Supervision, Modified independent (Device/Increase time) Gait Distance (Feet): 180 Feet Assistive device: Rolling walker (2 wheels) Gait Pattern/deviations: Step-to pattern, Step-through pattern       General Gait Details: progression to step through with cues - with very light use of RW.  Stairs Stairs: Yes Stairs assistance: Contact guard assist, Supervision Stair Management: Alternating pattern, Step to pattern Number of Stairs: 3 (x2) General stair comments: cues for sequence and technique,  reviewed step to and reciprocal pattern with and without rails. no LOB, wife present  Wheelchair Mobility     Tilt Bed    Modified Rankin (Stroke Patients Only)       Balance Overall balance assessment: No apparent balance deficits (not formally assessed)                                           Pertinent Vitals/Pain Pain Assessment Pain Assessment: 0-10 Pain Score: 3  Pain Location: L hip Pain Descriptors / Indicators: Sore Pain Intervention(s): Limited activity within patient's tolerance, Monitored during session, Repositioned, Premedicated before session    Home Living Family/patient expects to be discharged to:: Private residence Living Arrangements: Spouse/significant other   Type of Home: House Home Access: Stairs to enter Entrance Stairs-Rails: None Entrance Stairs-Number of Steps: 2   Home Layout: One level Home Equipment: None  Prior Function Prior Level of Function : Independent/Modified Independent                     Extremity/Trunk Assessment   Upper Extremity Assessment Upper Extremity Assessment: Overall WFL for tasks assessed    Lower Extremity Assessment Lower Extremity Assessment: LLE deficits/detail LLE Deficits / Details: grossly WFL for strength--any limitations d/t surgical pain, AROM hip flexion limited by pain       Communication    Communication Communication: No apparent difficulties  Cognition Arousal: Alert Behavior During Therapy: WFL for tasks assessed/performed Overall Cognitive Status: Within Functional Limits for tasks assessed                                          General Comments      Exercises Total Joint Exercises Ankle Circles/Pumps: AROM, Both, 5 reps Quad Sets: AROM, Left Heel Slides: AROM, AAROM, Left, 5 reps   Assessment/Plan    PT Assessment Patient does not need any further PT services  PT Problem List         PT Treatment Interventions      PT Goals (Current goals can be found in the Care Plan section)  Acute Rehab PT Goals PT Goal Formulation: With patient Time For Goal Achievement: 04/26/23 Potential to Achieve Goals: Good    Frequency       Co-evaluation               AM-PAC PT "6 Clicks" Mobility  Outcome Measure Help needed turning from your back to your side while in a flat bed without using bedrails?: A Little Help needed moving from lying on your back to sitting on the side of a flat bed without using bedrails?: A Little Help needed moving to and from a bed to a chair (including a wheelchair)?: A Little Help needed standing up from a chair using your arms (e.g., wheelchair or bedside chair)?: None Help needed to walk in hospital room?: None Help needed climbing 3-5 steps with a railing? : A Little 6 Click Score: 20    End of Session Equipment Utilized During Treatment: Gait belt Activity Tolerance: Patient tolerated treatment well Patient left: with call bell/phone within reach;in chair;with family/visitor present   PT Visit Diagnosis: Other abnormalities of gait and mobility (R26.89)    Time: 1001-1026 PT Time Calculation (min) (ACUTE ONLY): 25 min   Charges:   PT Evaluation $PT Eval Low Complexity: 1 Low   PT General Charges $$ ACUTE PT VISIT: 1 Visit         Chaunte Hornbeck, PT  Acute Rehab Dept [facility])  [phone]  04/19/2023   [facility] 04/19/2023, 10:36 AM

## 2023-04-19 NOTE — Progress Notes (Signed)
Transition of Care [facility]) - Inpatient Brief Assessment   Patient Details  Name: Oscar [name] MRN: [id number] Date of Birth: [birth year]  Transition of Care [facility]) CM/SW Contact:    Maryjean Ka, LCSW Phone Number: 04/19/2023, 11:06 AM   Clinical Narrative: Patient will return home with family support. This CSW met with patient and spouse Delice Bison at bedside. DME; RW has been coordinated and patient will discharge home.   Transition of Care Asessment: Insurance and Status: Insurance coverage has been reviewed Patient has primary care physician: Yes Home environment has been reviewed: Patient will return home with family support. Prior level of function:: Independent Prior/Current Home Services: No current home services Social Drivers of Health Review: SDOH reviewed no interventions necessary Readmission risk has been reviewed: Yes Transition of care needs: no transition of care needs at this time

## 2023-04-19 NOTE — TOC Progression Note (Signed)
Transition of Care [facility]) - Progression Note    Patient Details  Name: Oscar [name] MRN: [id number] Date of Birth: 1960-05-07  Transition of Care [facility]) CM/SW Contact  Maryjean Ka, [place] Phone Number: 04/19/2023, 10:57 AM  Clinical Narrative:     Patient will be going home with family support. This CSW met with patient and patient's spouse Oscar [name] at the bedside. This CSW introduced self and explained the role of CSW. Patient reports that he does need DME; RW. This CSW informed that CSW will assist with coordination of DME. CSW inquired about price to DME representative Vaughan Basta [phone] as requested by patient. Jermaine informed that patient will have a 20 percent copay which should range around $10.00 and patient will be billed.  No identified SDOH needs. Patient has PCP and access to medications finically as needed along with transport needs being met.  TOC signing off please reconsult with any other TOC needs.     Barriers to Discharge: No Barriers Identified  Expected Discharge Plan and Services         Expected Discharge Date: 04/19/23               DME Arranged: Dan Humphreys rolling DME Agency: [employer] Date DME Agency Contacted: 04/19/23 Time DME Agency Contacted: 1055 Representative spoke with at DME Agency: Vaughan Basta             Social Determinants of Health (SDOH) Interventions SDOH Screenings   Food Insecurity: No Food Insecurity (04/18/2023)  Housing: Low Risk  (04/18/2023)  Transportation Needs: No Transportation Needs (04/18/2023)  Utilities: Not At Risk (04/18/2023)  Financial Resource Strain: Low Risk  (12/23/2022)   Received from [facility]  Physical Activity: Sufficiently Active (12/23/2022)   Received from [facility]  Social Connections: Socially Integrated (12/23/2022)   Received from [facility]  Stress: Patient Declined (12/23/2022)   Received from [facility]  Tobacco Use: Low Risk  (04/18/2023)    Readmission Risk  Interventions     No data to display

## 2023-04-20 NOTE — Discharge Summary (Signed)
Patient ID: Oscar [name] MRN: [id number] DOB/AGE: Jan 14, 1961 63 y.o.  Admit date: 04/18/2023 Discharge date: 04/19/2023  Admission Diagnoses:  Left hip osteoarthritis  Discharge Diagnoses:  Principal Problem:   S/P total left hip arthroplasty   Past Medical History:  Diagnosis Date   Acute duodenal ulcer with hemorrhage    Adenomatous polyp    Allergy    Arthritis    Asthma    CHF (congestive heart failure) (HCC)    Chickenpox    Diverticular hemorrhage - recurrent 12/26/2012   Diverticulitis    Erythrocytosis 11/25/2018   Fracture of left distal radius 04/22/2013   Hepatitis A    as a child, no current liver problems   History of kidney stones    Hyperlipidemia    Hypertension    he also sees Dr. Gwenette Greet in [place], [place]    Lower GI bleed    Hx: of   Myocardial infarction (HCC)    Obesity    Pneumonia    Prediabetes    tx metformin   Schatzki's ring of distal esophagus    Sleep apnea 2003   uses CPAP machine, pt does not know settings    Surgeries: Procedure(s): TOTAL HIP ARTHROPLASTY ANTERIOR APPROACH on 04/18/2023   Consultants:   Discharged Condition: Improved  Hospital Course: Pacen Paradee is an 63 y.o. male who was admitted 04/18/2023 for operative treatment ofS/P total left hip arthroplasty. Patient has severe unremitting pain that affects sleep, daily activities, and work/hobbies. After pre-op clearance the patient was taken to the operating room on 04/18/2023 and underwent  Procedure(s): TOTAL HIP ARTHROPLASTY ANTERIOR APPROACH.    Patient was given perioperative antibiotics:  Anti-infectives (From admission, onward)    Start     Dose/Rate Route Frequency Ordered Stop   04/18/23 2000  ceFAZolin (ANCEF) IVPB 2g/100 mL premix        2 g 200 mL/hr over 30 Minutes Intravenous Every 6 hours 04/18/23 1625 04/19/23 0112   04/18/23 1130  ceFAZolin (ANCEF) 3 g in sodium chloride 0.9 % 100 mL IVPB        3 g 200 mL/hr over 30 Minutes Intravenous  Once  04/18/23 1116 04/19/23 1032   04/18/23 1115  ceFAZolin (ANCEF) IVPB 2g/100 mL premix  Status:  Discontinued        2 g 200 mL/hr over 30 Minutes Intravenous On call to O.R. 04/18/23 1115 04/18/23 1116        Patient was given sequential compression devices, early ambulation, and chemoprophylaxis to prevent DVT. Patient worked with PT and was meeting their goals regarding safe ambulation and transfers.  Patient benefited maximally from hospital stay and there were no complications.    Recent vital signs: Patient Vitals for the past 24 hrs:  BP Temp Temp src Pulse Resp SpO2  04/19/23 1001 135/79 98.2 F (36.8 C) Oral 85 17 94 %     Recent laboratory studies:  Recent Labs    04/19/23 0347  WBC 19.3*  HGB 16.0  HCT 48.9  PLT 236  NA 134*  K 4.5  CL 101  CO2 26  BUN 24*  CREATININE 1.43*  GLUCOSE 155*  CALCIUM 8.8*     Discharge Medications:   Allergies as of 04/19/2023       Reactions   Acetaminophen Other (See Comments)   Bleeding issues   Ibuprofen Other (See Comments)   Bleeding issues   Advair Diskus [fluticasone-salmeterol] Cough   Latex Itching        Medication  List     STOP taking these medications    aspirin EC 81 MG tablet Replaced by: aspirin 81 MG chewable tablet       TAKE these medications    amLODipine 5 MG tablet Commonly known as: NORVASC Take 1 tablet (5 mg total) by mouth at bedtime. What changed: when to take this   aspirin 81 MG chewable tablet Commonly known as: Aspirin Childrens Chew 1 tablet (81 mg total) by mouth in the morning and at bedtime for 28 days. Replaces: aspirin EC 81 MG tablet   cetirizine 10 MG tablet Commonly known as: ZYRTEC Take 10 mg by mouth daily as needed for allergies.   CO Q 10 PO Take 1 tablet by mouth daily in the afternoon.   DHEA PO Take 35 mg by mouth daily.   DIALYVITE VITAMIN D 5000 PO Take 10,000 Units by mouth.   GARLIC PO Take 1 capsule by mouth daily.   hydrochlorothiazide  12.5 MG capsule Commonly known as: MICROZIDE Take 12.5 mg by mouth daily.   LYSINE PO Take 1 capsule by mouth daily.   Magnesium Oxide 420 MG Tabs Take 840 mg by mouth daily.   metFORMIN 1000 MG tablet Commonly known as: GLUCOPHAGE Take 1,000 mg by mouth 2 (two) times daily.   methocarbamol 500 MG tablet Commonly known as: ROBAXIN Take 1 tablet (500 mg total) by mouth every 6 (six) hours as needed for muscle spasms.   OVER THE COUNTER MEDICATION Take 1 tablet by mouth daily. Primal defense probiotic tablet   OVER THE COUNTER MEDICATION Take 1 tablet by mouth daily. Liver MD   OVER THE COUNTER MEDICATION Take 5 mLs by mouth daily. My vital C   oxyCODONE 5 MG immediate release tablet Commonly known as: Roxicodone Take 1 tablet (5 mg total) by mouth every 4 (four) hours as needed for severe pain (pain score 7-10).   polyethylene glycol 17 g packet Commonly known as: MIRALAX / GLYCOLAX Take 17 g by mouth 2 (two) times daily.   PROBIOTIC PO Take 1 tablet by mouth in the morning and at bedtime. Align   Repatha SureClick 140 MG/ML Soaj Generic drug: Evolocumab ADMINISTER 1 ML UNDER THE SKIN EVERY 14 DAYS   rosuvastatin 5 MG tablet Commonly known as: CRESTOR Take 5 mg by mouth daily.   senna 8.6 MG Tabs tablet Commonly known as: SENOKOT Take 1 tablet (8.6 mg total) by mouth at bedtime for 14 days.   VITAMIN B 12 PO Take 1 tablet by mouth daily.   Vitamin C CR 1000 MG Tbcr Take 1,000 mg by mouth daily.   Zinc 30 MG Tabs Take 30 mg by mouth daily.        Diagnostic Studies: DG Pelvis Portable Result Date: 04/18/2023 CLINICAL DATA:  Status post hip arthroplasty. EXAM: PORTABLE PELVIS 1-2 VIEWS COMPARISON:  None Available. FINDINGS: Left hip arthroplasty in expected alignment. No periprosthetic lucency or fracture. Recent postsurgical change includes air and edema in the soft tissues. IMPRESSION: Left hip arthroplasty without immediate postoperative complication.  Electronically Signed   By: Narda Rutherford M.D.   On: 04/18/2023 16:37   DG HIP PORT UNILAT WITH PELVIS 1V LEFT Result Date: 04/18/2023 CLINICAL DATA:  Total left hip arthroplasty anterior approach. Intraoperative fluoroscopy. EXAM: DG HIP (WITH OR WITHOUT PELVIS) 1V PORT LEFT COMPARISON:  CT abdomen and pelvis 10/30/2022 FINDINGS: Images were performed intraoperatively without the presence of a radiologist. The patient is undergoing total left hip arthroplasty. No hardware complication  is seen. Total fluoroscopy images: 2 Total fluoroscopy time: 11 seconds Total dose: Radiation Exposure Index (as provided by the fluoroscopic device): 2.64 mGy air Kerma Please see intraoperative findings for further detail. IMPRESSION: Intraoperative fluoroscopy for total left hip arthroplasty. Electronically Signed   By: Neita Garnet M.D.   On: 04/18/2023 15:36   DG C-Arm 1-60 Min-No Report Result Date: 04/18/2023 Fluoroscopy was utilized by the requesting physician.  No radiographic interpretation.    Disposition: Discharge disposition: 01-Home or Self Care          Follow-up Information     Durene Romans, MD. Schedule an appointment as soon as possible for a visit in 2 week(s).   Specialty: Orthopedic Surgery Contact information: [address] [place] [address] [phone]                  Signed: Cassandria Anger 04/20/2023, 7:37 AM

## 2023-04-21 ENCOUNTER — Encounter (HOSPITAL_COMMUNITY): Payer: Self-pay | Admitting: Orthopedic Surgery

## 2023-04-24 NOTE — Telephone Encounter (Signed)
Spoke with patient who states that his CPAP DME company called the wrong office for assistance. He states he has already rectified the situation and is not in need of our services.

## 2023-10-15 ENCOUNTER — Encounter (HOSPITAL_COMMUNITY): Admitting: Cardiology

## 2023-12-29 ENCOUNTER — Other Ambulatory Visit (HOSPITAL_COMMUNITY): Payer: Self-pay

## 2023-12-30 ENCOUNTER — Telehealth: Payer: Self-pay

## 2023-12-30 DIAGNOSIS — E1169 Type 2 diabetes mellitus with other specified complication: Secondary | ICD-10-CM

## 2023-12-30 DIAGNOSIS — I2510 Atherosclerotic heart disease of native coronary artery without angina pectoris: Secondary | ICD-10-CM

## 2023-12-30 NOTE — Telephone Encounter (Signed)
 Pharmacy Patient Advocate Encounter   Received notification from PHARMACY that prior authorization for REPATHA  is required/requested.   Insurance verification completed.   The patient is insured through [facility].   Per test claim: PA required; PA submitted to above mentioned insurance via Latent Key/confirmation #/EOC [id number] Status is pending

## 2024-01-01 ENCOUNTER — Other Ambulatory Visit (HOSPITAL_COMMUNITY): Payer: Self-pay

## 2024-01-01 NOTE — Telephone Encounter (Signed)
 Pharmacy Patient Advocate Encounter  Received notification from [facility] that Prior Authorization for REPATHA  has been APPROVED from 12/30/23 to 12/29/26. Ran test claim, Copay is $56. This test claim was processed through [facility]- copay amounts may vary at other pharmacies due to pharmacy/plan contracts, or as the patient moves through the different stages of their insurance plan.

## 2024-03-02 ENCOUNTER — Other Ambulatory Visit (HOSPITAL_COMMUNITY): Payer: Self-pay | Admitting: Cardiology

## 2024-03-30 MED ORDER — REPATHA SURECLICK 140 MG/ML ~~LOC~~ SOAJ: 140.0000 mg | SUBCUTANEOUS | 3 refills | Status: AC

## 2024-03-30 NOTE — Addendum Note (Signed)
 Addended by: DARRELL BRUCKNER on: 03/30/2024 11:54 AM   Modules accepted: Orders

## 2024-04-21 ENCOUNTER — Encounter (HOSPITAL_COMMUNITY): Payer: Self-pay | Admitting: Cardiology

## 2024-04-21 ENCOUNTER — Ambulatory Visit (HOSPITAL_COMMUNITY)
Admission: RE | Admit: 2024-04-21 | Discharge: 2024-04-21 | Disposition: A | Discharge status: Home / Self Care | Payer: Self-pay | Source: Ambulatory Visit | Attending: Cardiology | Admitting: Cardiology

## 2024-04-21 VITALS — BP 130/80 | HR 97 | Wt 242.2 lb

## 2024-04-21 DIAGNOSIS — I2510 Atherosclerotic heart disease of native coronary artery without angina pectoris: Secondary | ICD-10-CM | POA: Insufficient documentation

## 2024-04-21 DIAGNOSIS — E669 Obesity, unspecified: Secondary | ICD-10-CM

## 2024-04-21 DIAGNOSIS — I252 Old myocardial infarction: Secondary | ICD-10-CM | POA: Insufficient documentation

## 2024-04-21 DIAGNOSIS — I10 Essential (primary) hypertension: Secondary | ICD-10-CM | POA: Insufficient documentation

## 2024-04-21 DIAGNOSIS — Z8249 Family history of ischemic heart disease and other diseases of the circulatory system: Secondary | ICD-10-CM | POA: Insufficient documentation

## 2024-04-21 DIAGNOSIS — Z7984 Long term (current) use of oral hypoglycemic drugs: Secondary | ICD-10-CM | POA: Insufficient documentation

## 2024-04-21 DIAGNOSIS — Z951 Presence of aortocoronary bypass graft: Secondary | ICD-10-CM | POA: Insufficient documentation

## 2024-04-21 DIAGNOSIS — E785 Hyperlipidemia, unspecified: Secondary | ICD-10-CM | POA: Insufficient documentation

## 2024-04-21 DIAGNOSIS — E119 Type 2 diabetes mellitus without complications: Secondary | ICD-10-CM | POA: Insufficient documentation

## 2024-04-21 DIAGNOSIS — Z79899 Other long term (current) drug therapy: Secondary | ICD-10-CM | POA: Insufficient documentation

## 2024-04-21 DIAGNOSIS — Z7985 Long-term (current) use of injectable non-insulin antidiabetic drugs: Secondary | ICD-10-CM | POA: Insufficient documentation

## 2024-04-21 DIAGNOSIS — Z7982 Long term (current) use of aspirin: Secondary | ICD-10-CM | POA: Insufficient documentation

## 2024-04-21 MED ORDER — TIRZEPATIDE 2.5 MG/0.5ML ~~LOC~~ SOAJ: 2.5000 mg | SUBCUTANEOUS | Status: AC

## 2024-04-21 MED ORDER — ASPIRIN 81 MG PO TBEC: 81.0000 mg | DELAYED_RELEASE_TABLET | Freq: Every day | ORAL | Status: AC

## 2024-04-21 NOTE — Patient Instructions (Signed)
 Medication Changes:  None, continue current medications   Referrals:  You have been referred to Pharmacy Clinic to see if insurance will cover [facility], they will contact you  Special Instructions // Education:  Do the following things EVERYDAY: Weigh yourself in the morning before breakfast. Write it down and keep it in a log. Take your medicines as prescribed Eat low salt foods--Limit salt (sodium) to 2000 mg per day.  Stay as active as you can everyday Limit all fluids for the day to less than 2 liters   Follow-Up in: 1 year (Jan 2027), **PLEASE CALL OUR OFFICE IN NOVEMBER TO SCHEDULE THIS APPOINTMENT   At the [facility], you and your health needs are our priority. We have a designated team specialized in the treatment of Heart Failure. This Care Team includes your primary Heart Failure Specialized Cardiologist (physician), Advanced Practice Providers (APPs- Physician Assistants and Nurse Practitioners), and Pharmacist who all work together to provide you with the care you need, when you need it.   You may see any of the following providers on your designated Care Team at your next follow up:  Dr. Toribio Fuel Dr. Ezra Shuck Dr. Odis Brownie Greig Mosses, NP Caffie Shed, [place] [facility] [place], [place] Beckey Coe, NP Jordan Lee, NP Tinnie Redman, PharmD   Please be sure to bring in all your medications bottles to every appointment.   Need to Contact Us :  If you have any questions or concerns before your next appointment please send us  a message through [place] or call our office at [phone].    TO LEAVE A MESSAGE FOR THE NURSE SELECT OPTION 2, PLEASE LEAVE A MESSAGE INCLUDING: YOUR NAME DATE OF BIRTH CALL BACK NUMBER REASON FOR CALL**this is important as we prioritize the call backs  YOU WILL RECEIVE A CALL BACK THE SAME DAY AS LONG AS YOU CALL BEFORE 4:00 PM

## 2024-04-22 NOTE — Progress Notes (Addendum)
 PCP: Beam, Lamar POUR, MD Cardiology: Dr. Rolan  64 y.o. with history of CAD s/p CABG was self-referred for evaluation of CAD.  Patient has a very strong family history of premature CAD.  He was in his usual state of health until 7/20 when he presented to the ER with chest pain and was found to have NSTEMI.  Cath showed 3 vessel disease, patient was taken for CABG x 4 by Dr. German.  Post-op, he had brief atrial fibrillation that resolved quickly.  No further atrial fibrillation has been seen.  Echo in 7/20 showed EF 60-65%.  Cardiac MRI in 6/21 at [facility] was normal.  In 5/22, patient had LHC that showed atretic RIMA-RCA with moderate RCA disease.  However, FFR and RFR of the RCA were negative, so he was medically managed.   RIght THR in 1/25.   Patient returns for followup of CAD.  He continues to use tirzepatide  and has lost 21 lbs since last appointment and about 50 lbs overall.  He no longer snores or has daytime sleepiness and has stopped using CPAP.  He works out at [employer] regularly, no exertional dyspnea or chest pain.  SBP 110s-120s at home.  Really has no limitations.   Labs (10/23): LDL 16, hs-CRP 2.6 Labs (7/24): LDL 10, TGs 166, HDL 41 Labs (9/24): Lp(a) 48 Labs (11/25): hgb 20, K 4.2, creatinine 1.3, LFTs normal, hgbA1c 5.2, HDL 39, LDL 26  PMH: 1. CAD: NSTEMI in 7/20 with cath showing 3 vessel disease.  - CABG (7/20) with LIMA-LAD, RIMA-PDA, SVG-diagonal, radial-OM.  - Echo (7/20): EF 60-65%, normal RV.  - Stress echo (6/21): EF >55%, no ischemia.  - LHC (5/22): RIMA-RCA atretic with 65% pRCA and 65% PDA stenosis, 99% mLCx with patent radial-OM3, 70% D1 with patent SVG-D1, LIMA-LAD patent with 90% pLAD.   Negative FFR and RFR of RCA, medical management.  2. Atrial fibrillation: Paroxysmal, only noted briefly post-op CABG.  3. Hyperlipidemia 4. HTN 5. PUD: GI bleeding post-MI.  6. S/p appendectomy 7. Multiple abdominal ventral hernia surgeries.  8. Type 2 diabetes 9. OSA: Uses  CPAP 10. Erythrocytosis 11. S/p THR  FH: Premature CAD.  Mother with MI in her 52s, brother with Marfans, another brother with CABG, father died with cardiac arrest.   SH: Nonsmoker.  Married with children, lives in [place].  Owns [employer].   ROS: All systems reviewed and negative except as per HPI.   Current Outpatient Medications  Medication Sig Dispense Refill   amLODipine  (NORVASC ) 5 MG tablet Take 1 tablet (5 mg total) by mouth daily. PLEASE SCHEDULE APPOINTMENT FOR MORE REFILLS  [phone] OPTION 2 90 tablet 3   Ascorbic Acid  (VITAMIN C  CR) 1000 MG TBCR Take 1,000 mg by mouth daily.     aspirin  EC 81 MG tablet Take 1 tablet (81 mg total) by mouth daily. Swallow whole.     Cholecalciferol  (DIALYVITE VITAMIN D  5000 PO) Take 10,000 Units by mouth.     Coenzyme Q10 (CO Q 10 PO) Take 1 tablet by mouth daily in the afternoon. (Patient taking differently: Take 1 tablet by mouth 3 (three) times a week.)     Cyanocobalamin (VITAMIN B 12 PO) Take 1 tablet by mouth daily.     Evolocumab  (REPATHA  SURECLICK) 140 MG/ML SOAJ Inject 140 mg into the skin every 14 (fourteen) days. 6 mL 3   hydrochlorothiazide  (MICROZIDE ) 12.5 MG capsule Take 12.5 mg by mouth daily.     LYSINE PO Take 1 capsule  by mouth daily.     Magnesium  Oxide 420 MG TABS Take 840 mg by mouth daily.     metFORMIN  (GLUCOPHAGE ) 1000 MG tablet Take 1,000 mg by mouth 2 (two) times daily.     OVER THE COUNTER MEDICATION Take 1 tablet by mouth daily. Primal defense probiotic tablet     OVER THE COUNTER MEDICATION Take 5 mLs by mouth daily. My vital C (Patient taking differently: Take 5 mLs by mouth 2 (two) times daily. My vital C)     Prasterone, DHEA, (DHEA PO) Take 35 mg by mouth daily.     Probiotic Product (PROBIOTIC PO) Take 1 tablet by mouth in the morning and at bedtime. Align     rosuvastatin  (CRESTOR ) 5 MG tablet Take 5 mg by mouth daily.     tirzepatide  (MOUNJARO ) 2.5 MG/0.5ML Pen Inject 2.5 mg into the  skin once a week.     Zinc 30 MG TABS Take 30 mg by mouth daily.     OVER THE COUNTER MEDICATION Take 1 tablet by mouth daily. Liver MD     No current facility-administered medications for this encounter.   BP 130/80   Pulse 97   Wt 109.9 kg (242 lb 3.2 oz)   SpO2 94%   BMI 31.10 kg/m  General: NAD Neck: No JVD, no thyromegaly or thyroid nodule.  Lungs: Clear to auscultation bilaterally with normal respiratory effort. CV: Nondisplaced PMI.  Heart regular S1/S2, no S3/S4, no murmur.  No peripheral edema.  No carotid bruit.  Normal pedal pulses.  Abdomen: Soft, nontender, no hepatosplenomegaly, no distention.  Skin: Intact without lesions or rashes.  Neurologic: Alert and oriented x 3.  Psych: Normal affect. Extremities: No clubbing or cyanosis.  HEENT: Normal.   Assessment/Plan: 1.  CAD: NSTEMI in 7/20 with CABG x 4.  He has a very strong family history of CAD.  He is a nonsmoker.  ETT-echo in 6/21 was normal.  LHC in 5/22 showed atretic RIMA-RCA and moderate disease in RCA that was not hemodynamically significant by FFR or RFR.  He was medically managed.  If RCA disease were to worsen in the future, it is possible that RIMA flow may improve to compensate.  No exertional symptoms.  - Continue ASA 81 daily.  - Continue management with Crestor  5 and Repatha .  - He is on GLP-1 agonist tirzepatide .   2. HTN: BP is well-controlled.  - Continue amlodipine  5 mg daily.  3. Hyperlipidemia: LDL has been very low, was 26 in 11/25.  Lp(a) not elevated significantly in 9/24.  - Continue current Crestor  and Repatha .  4. Type 2 diabetes: He is on metformin  and tirzepatide .  HgbA1c was normal in 11/25.   5. Erythrocytosis: Hgb 20 in 11/25, I think he may be on testosterone via wellness physician.  He may need to cut back on this and may need phlebotomy.  I asked him to check with his physician about this.   Followup in 1 year.    I spent 22 minutes reviewing records, interviewing/examining  patient, and managing orders.   Ezra Shuck 04/22/2024

## 2024-05-06 ENCOUNTER — Encounter (HOSPITAL_COMMUNITY): Payer: Self-pay

## 2024-05-06 ENCOUNTER — Other Ambulatory Visit (HOSPITAL_COMMUNITY): Payer: Self-pay

## 2024-05-06 DIAGNOSIS — I2510 Atherosclerotic heart disease of native coronary artery without angina pectoris: Secondary | ICD-10-CM

## 2024-05-06 NOTE — Progress Notes (Signed)
 cbc

## 2024-06-11 ENCOUNTER — Ambulatory Visit: Payer: Self-pay | Admitting: Pharmacist
